# Patient Record
Sex: Female | Born: 1950
Health system: Southern US, Community
[De-identification: ages and names within clinical notes are randomized; demographics above are authoritative.]

## PROBLEM LIST (undated history)

## (undated) DIAGNOSIS — F419 Anxiety disorder, unspecified: Secondary | ICD-10-CM

## (undated) DIAGNOSIS — J302 Other seasonal allergic rhinitis: Secondary | ICD-10-CM

## (undated) DIAGNOSIS — K219 Gastro-esophageal reflux disease without esophagitis: Secondary | ICD-10-CM

## (undated) DIAGNOSIS — Z87440 Personal history of urinary (tract) infections: Secondary | ICD-10-CM

## (undated) DIAGNOSIS — C801 Malignant (primary) neoplasm, unspecified: Secondary | ICD-10-CM

## (undated) DIAGNOSIS — E785 Hyperlipidemia, unspecified: Secondary | ICD-10-CM

## (undated) DIAGNOSIS — I341 Nonrheumatic mitral (valve) prolapse: Secondary | ICD-10-CM

## (undated) DIAGNOSIS — I639 Cerebral infarction, unspecified: Secondary | ICD-10-CM

## (undated) DIAGNOSIS — M1990 Unspecified osteoarthritis, unspecified site: Secondary | ICD-10-CM

## (undated) DIAGNOSIS — I10 Essential (primary) hypertension: Secondary | ICD-10-CM

## (undated) HISTORY — DX: Essential (primary) hypertension: I10

## (undated) HISTORY — DX: Nonrheumatic mitral (valve) prolapse: I34.1

## (undated) HISTORY — PX: BASAL CELL CARCINOMA EXCISION: SHX1214

## (undated) HISTORY — PX: WISDOM TOOTH EXTRACTION: SHX21

## (undated) HISTORY — PX: OTHER SURGICAL HISTORY: SHX169

## (undated) HISTORY — DX: Anxiety disorder, unspecified: F41.9

## (undated) HISTORY — DX: Personal history of urinary (tract) infections: Z87.440

## (undated) HISTORY — DX: Hyperlipidemia, unspecified: E78.5

## (undated) HISTORY — PX: COLONOSCOPY: SHX174

## (undated) HISTORY — DX: Gastro-esophageal reflux disease without esophagitis: K21.9

## (undated) HISTORY — DX: Cerebral infarction, unspecified: I63.9

## (undated) HISTORY — PX: SINUS SURGERY WITH INSTATRAK: SHX5215

---

## 2004-04-13 ENCOUNTER — Ambulatory Visit: Payer: Self-pay | Admitting: Internal Medicine

## 2004-08-01 ENCOUNTER — Ambulatory Visit: Payer: Self-pay | Admitting: Internal Medicine

## 2004-08-08 ENCOUNTER — Ambulatory Visit: Payer: Self-pay | Admitting: Internal Medicine

## 2004-08-17 ENCOUNTER — Ambulatory Visit: Payer: Self-pay | Admitting: Internal Medicine

## 2004-09-15 ENCOUNTER — Ambulatory Visit: Payer: Self-pay | Admitting: Internal Medicine

## 2004-11-28 ENCOUNTER — Ambulatory Visit: Payer: Self-pay | Admitting: Internal Medicine

## 2004-12-29 ENCOUNTER — Ambulatory Visit: Payer: Self-pay | Admitting: Internal Medicine

## 2005-01-03 ENCOUNTER — Encounter: Admission: RE | Admit: 2005-01-03 | Discharge: 2005-01-03 | Payer: Self-pay | Admitting: Internal Medicine

## 2005-06-05 ENCOUNTER — Ambulatory Visit: Payer: Self-pay | Admitting: Internal Medicine

## 2005-06-19 ENCOUNTER — Ambulatory Visit: Payer: Self-pay | Admitting: Internal Medicine

## 2006-04-11 ENCOUNTER — Ambulatory Visit: Payer: Self-pay | Admitting: Internal Medicine

## 2006-04-23 ENCOUNTER — Ambulatory Visit: Payer: Self-pay | Admitting: Internal Medicine

## 2006-05-21 ENCOUNTER — Ambulatory Visit: Payer: Self-pay | Admitting: Internal Medicine

## 2006-06-26 ENCOUNTER — Ambulatory Visit: Payer: Self-pay | Admitting: Internal Medicine

## 2006-07-03 ENCOUNTER — Ambulatory Visit: Payer: Self-pay | Admitting: Internal Medicine

## 2006-08-20 ENCOUNTER — Encounter (INDEPENDENT_AMBULATORY_CARE_PROVIDER_SITE_OTHER): Payer: Self-pay | Admitting: Specialist

## 2006-08-20 ENCOUNTER — Ambulatory Visit (HOSPITAL_BASED_OUTPATIENT_CLINIC_OR_DEPARTMENT_OTHER): Admission: RE | Admit: 2006-08-20 | Discharge: 2006-08-20 | Payer: Self-pay | Admitting: General Surgery

## 2006-12-07 DIAGNOSIS — I10 Essential (primary) hypertension: Secondary | ICD-10-CM | POA: Insufficient documentation

## 2006-12-10 DIAGNOSIS — I341 Nonrheumatic mitral (valve) prolapse: Secondary | ICD-10-CM | POA: Insufficient documentation

## 2006-12-31 ENCOUNTER — Ambulatory Visit: Payer: Self-pay | Admitting: Internal Medicine

## 2006-12-31 DIAGNOSIS — K219 Gastro-esophageal reflux disease without esophagitis: Secondary | ICD-10-CM | POA: Insufficient documentation

## 2006-12-31 DIAGNOSIS — G4700 Insomnia, unspecified: Secondary | ICD-10-CM | POA: Insufficient documentation

## 2007-02-15 ENCOUNTER — Telehealth: Payer: Self-pay | Admitting: Internal Medicine

## 2007-02-16 ENCOUNTER — Ambulatory Visit: Payer: Self-pay | Admitting: Family Medicine

## 2007-02-16 ENCOUNTER — Encounter: Payer: Self-pay | Admitting: Internal Medicine

## 2007-05-10 ENCOUNTER — Ambulatory Visit: Payer: Self-pay | Admitting: Internal Medicine

## 2007-05-13 ENCOUNTER — Telehealth: Payer: Self-pay | Admitting: Internal Medicine

## 2007-06-19 ENCOUNTER — Telehealth: Payer: Self-pay | Admitting: Internal Medicine

## 2007-07-01 ENCOUNTER — Ambulatory Visit: Payer: Self-pay | Admitting: Internal Medicine

## 2007-07-01 DIAGNOSIS — N959 Unspecified menopausal and perimenopausal disorder: Secondary | ICD-10-CM | POA: Insufficient documentation

## 2007-07-03 ENCOUNTER — Ambulatory Visit: Payer: Self-pay | Admitting: Cardiology

## 2007-08-07 ENCOUNTER — Telehealth: Payer: Self-pay | Admitting: Internal Medicine

## 2007-09-17 ENCOUNTER — Ambulatory Visit: Payer: Self-pay | Admitting: Internal Medicine

## 2007-10-17 ENCOUNTER — Telehealth: Payer: Self-pay | Admitting: Internal Medicine

## 2007-10-21 ENCOUNTER — Telehealth: Payer: Self-pay | Admitting: Internal Medicine

## 2007-11-04 ENCOUNTER — Encounter: Payer: Self-pay | Admitting: Internal Medicine

## 2008-01-13 ENCOUNTER — Ambulatory Visit: Payer: Self-pay | Admitting: Internal Medicine

## 2008-01-21 ENCOUNTER — Ambulatory Visit: Payer: Self-pay

## 2008-01-21 ENCOUNTER — Encounter: Payer: Self-pay | Admitting: Internal Medicine

## 2008-01-29 ENCOUNTER — Telehealth: Payer: Self-pay | Admitting: Internal Medicine

## 2008-02-12 ENCOUNTER — Ambulatory Visit: Payer: Self-pay | Admitting: Internal Medicine

## 2008-03-30 ENCOUNTER — Ambulatory Visit: Payer: Self-pay | Admitting: Internal Medicine

## 2008-10-07 ENCOUNTER — Telehealth (INDEPENDENT_AMBULATORY_CARE_PROVIDER_SITE_OTHER): Payer: Self-pay | Admitting: *Deleted

## 2008-11-02 ENCOUNTER — Ambulatory Visit: Payer: Self-pay | Admitting: Internal Medicine

## 2008-11-02 DIAGNOSIS — R42 Dizziness and giddiness: Secondary | ICD-10-CM | POA: Insufficient documentation

## 2009-02-15 ENCOUNTER — Telehealth: Payer: Self-pay | Admitting: Internal Medicine

## 2009-05-15 DIAGNOSIS — I639 Cerebral infarction, unspecified: Secondary | ICD-10-CM

## 2009-05-15 HISTORY — DX: Cerebral infarction, unspecified: I63.9

## 2009-06-02 ENCOUNTER — Inpatient Hospital Stay (HOSPITAL_COMMUNITY): Admission: AD | Admit: 2009-06-02 | Discharge: 2009-06-04 | Payer: Self-pay | Admitting: Neurology

## 2009-06-02 ENCOUNTER — Emergency Department (HOSPITAL_COMMUNITY): Admission: EM | Admit: 2009-06-02 | Discharge: 2009-06-02 | Payer: Self-pay | Admitting: Emergency Medicine

## 2009-06-02 ENCOUNTER — Ambulatory Visit: Payer: Self-pay | Admitting: Cardiovascular Disease

## 2009-06-03 ENCOUNTER — Encounter (INDEPENDENT_AMBULATORY_CARE_PROVIDER_SITE_OTHER): Payer: Self-pay | Admitting: Neurology

## 2009-06-04 ENCOUNTER — Encounter (INDEPENDENT_AMBULATORY_CARE_PROVIDER_SITE_OTHER): Payer: Self-pay | Admitting: Neurology

## 2009-06-04 ENCOUNTER — Encounter: Payer: Self-pay | Admitting: Cardiovascular Disease

## 2010-02-22 ENCOUNTER — Ambulatory Visit: Payer: Self-pay | Admitting: Internal Medicine

## 2010-02-22 DIAGNOSIS — Z8673 Personal history of transient ischemic attack (TIA), and cerebral infarction without residual deficits: Secondary | ICD-10-CM | POA: Insufficient documentation

## 2010-02-22 DIAGNOSIS — R5381 Other malaise: Secondary | ICD-10-CM | POA: Insufficient documentation

## 2010-02-22 DIAGNOSIS — R5383 Other fatigue: Secondary | ICD-10-CM | POA: Insufficient documentation

## 2010-02-22 DIAGNOSIS — E785 Hyperlipidemia, unspecified: Secondary | ICD-10-CM | POA: Insufficient documentation

## 2010-06-16 NOTE — Consult Note (Signed)
Summary: [facility]   Imported By: Roderic Ovens 06/11/2009 14:27:11  _____________________________________________________________________  External Attachment:    Type:   Image     Comment:   External Document

## 2010-06-16 NOTE — Assessment & Plan Note (Signed)
Summary: med check/refill and flu shot/cjr   Vital Signs:  Patient profile:   60 year old female Weight:      135 pounds Temp:     98.6 degrees F oral Pulse rate:   60 / minute Pulse rhythm:   regular BP sitting:   100 / 72  Vitals Entered By: Lynann Beaver CMA (February 22, 2010 12:22 PM)  History of Present Illness:  Follow-Up Visit      This is a 60 year old woman who presents for Follow-up visit.  The patient denies chest pain and palpitations.  Since the last visit the patient notes no new problems or concerns.  The patient reports taking meds as prescribed.  When questioned about possible medication side effects, the patient notes none.    All other systems reviewed and were negative except admits to chronic fatigue---ongoing for months/years  Current Medications (verified): 1)  Hydrochlorothiazide 25 Mg Tabs (Hydrochlorothiazide) .... Take 1/2 By Mouth Daily 2)  Protonix 40 Mg  Tbec (Pantoprazole Sodium) .... Once Daily As Needed 3)  Estrace 1 Mg  Tabs (Estradiol) .... Take 1 Tablet By Mouth Once A Day 4)  Simvastatin 20 Mg Tabs (Simvastatin) .[name].. 1 By Mouth At Bedtime  Allergies (verified): 1)  * Hydrocodeine 2)  Bactrim (Sulfamethoxazole-Trimethoprim) 3)  Citalopram Hydrobromide (Citalopram Hydrobromide) 4)  Codeine Phosphate (Codeine Phosphate) 5)  Macrodantin (Nitrofurantoin Macrocrystal)  Past History:  Past Medical History: Hypertension mitral valve prolapse hx UTI Anxiety GERD Stroke 2011 Hyperlipidemia  Physical Exam  General:  well-developed well-nourished female in no acute distress. HEENT exam atraumatic, normocephalic symmetric her muscles intact. Speech is normal. Chest clear to auscultation without increased work of breathing. Neck is supple without lymphadenopathy, thyromegaly, jugular venous distention. Carotid upstrokes normal. Cardiac exam S1-S2 are regulaR> abdominal exam: Thin, active bowel sounds, soft and nontender.. Extremities there is no  clubbing cyanosis or edema.   Impression & Recommendations:  Problem # 1:  CVA (ICD-434.91) no recurrence  Problem # 2:  HYPERTENSION (ICD-401.9) controlled Her updated medication list for this problem includes:    Hydrochlorothiazide 25 Mg Tabs (Hydrochlorothiazide) .[name]... Take 1/2 by mouth daily  BP today: 100/72 Prior BP: 116/70 (11/02/2008)  Problem # 3:  FATIGUE (ICD-780.79)  Orders: TLB-BMP (Basic Metabolic Panel-BMET) (80048-METABOL) TLB-Hepatic/Liver Function Pnl (80076-HEPATIC) TLB-CBC Platelet - w/Differential (85025-CBCD) TLB-TSH (Thyroid Stimulating Hormone) (84443-TSH) TLB-Sedimentation Rate (ESR) (85652-ESR) Venipuncture (29528) Specimen Handling (41324)  Problem # 4:  HYPERLIPIDEMIA (ICD-272.4)  Her updated medication list for this problem includes:    Simvastatin 20 Mg Tabs (Simvastatin) .[name]... 1 by mouth at bedtime  Orders: TLB-Lipid Panel (80061-LIPID) Venipuncture (40102) Specimen Handling (72536)  Complete Medication List: 1)  Hydrochlorothiazide 25 Mg Tabs (Hydrochlorothiazide) .... Take 1/2 by mouth daily 2)  Protonix 40 Mg Tbec (Pantoprazole sodium) .... Once daily as needed 3)  Estrace 1 Mg Tabs (Estradiol) .... Take 1 tablet by mouth once a day 4)  Simvastatin 20 Mg Tabs (Simvastatin) .[name].. 1 by mouth at bedtime  Other Orders: Admin 1st Vaccine (64403) Flu Vaccine 82yrs + (47425) Prescriptions: SIMVASTATIN 20 MG TABS (SIMVASTATIN) 1 by mouth at bedtime  #90 x 3   Entered and Authorized by:   Birdie Sons MD   Signed by:   Birdie Sons MD on 02/22/2010   Method used:   [facility]        [facility]  [address] [address] [phone][facility] ([facility]ail)       [address]  [place], [place]  [address]       Ph: [phone] or [phone]       Fax: [phone]   RxID:   [id number] PROTONIX 40 MG  TBEC (PANTOPRAZOLE SODIUM) once daily as needed  #90 Each x 3   Entered and Authorized by:   Birdie Sons MD   Signed by:   Birdie Sons MD on  02/22/2010   Method used:   [facility]        [facility]  [address] [phone][facility] (retail[facility]       [address]       [place], [place]  [address]       Ph: [phone] or [phone]       Fax: [phone]   RxID:   [id number] HYDROCHLOROTHIAZIDE 25 MG TABS (HYDROCHLOROTHIAZIDE) Take 1/2 by mouth daily  #100 E90ach x 3   Entered and Authorized by:   Birdie Sons MD   Signed by:   Birdie Sons MD on 02/22/2010   Method used:   [facility]        [facility]  [address] [phone][facility] (retail[facility]       [address]       [place], [place]  [address]       Ph: [phone] or [phone]       Fax: [phone]   RxID:   [id number] SIMVASTATIN 20 MG TABS (SIMVASTATIN) 1 by mouth at bedtime  #90 x 3   Entered and Authorized by:   Birdie Sons MD   Signed by:   Birdie Sons MD on 02/22/2010   Method used:   Electronically to        [facility] (retail)       [address]       [place], [place]  [address]       Ph: [phone]       Fax: [phone]   RxID:   [id number]  Flu Vaccine Consent Questions     Do you have a history of severe allergic reactions to this vaccine? no    Any prior history of allergic reactions to egg and/or gelatin? no    Do you have a sensitivity to the preservative Thimersol? no    Do you have a past history of Guillan-Barre Syndrome? no    Do you currently have an acute febrile illness? no    Have you ever had a severe reaction to latex? no    Vaccine information given and explained to patient? yes    Are you currently pregnant? no    Lot Number:[id number]   Exp Date:11/12/2010   Site Given  Left Deltoid IM       [facility]       [place], [place]  [address]       Ph: [phone]       Fax: [phone]   RxID:   [id number]  .[email or web address]

## 2010-07-31 LAB — LIPID PANEL
Cholesterol: 179 mg/dL (ref 0–200)
VLDL: 14 mg/dL (ref 0–40)

## 2010-07-31 LAB — GLUCOSE, CAPILLARY
Glucose-Capillary: 110 mg/dL — ABNORMAL HIGH (ref 70–99)
Glucose-Capillary: 85 mg/dL (ref 70–99)
Glucose-Capillary: 86 mg/dL (ref 70–99)

## 2010-08-01 LAB — DIFFERENTIAL
Basophils Absolute: 0 10*3/uL (ref 0.0–0.1)
Basophils Relative: 1 % (ref 0–1)
Lymphocytes Relative: 34 % (ref 12–46)
Lymphs Abs: 1.5 10*3/uL (ref 0.7–4.0)
Neutro Abs: 2.5 10*3/uL (ref 1.7–7.7)
Neutrophils Relative %: 55 % (ref 43–77)

## 2010-08-01 LAB — CREATININE, SERUM: Creatinine, Ser: 0.67 mg/dL (ref 0.4–1.2)

## 2010-08-01 LAB — URINALYSIS, ROUTINE W REFLEX MICROSCOPIC
Bilirubin Urine: NEGATIVE
Glucose, UA: NEGATIVE mg/dL
Hgb urine dipstick: NEGATIVE

## 2010-08-01 LAB — TROPONIN I: Troponin I: <0.01 ng/mL (ref 0.00–0.06)

## 2010-08-01 LAB — CBC
Hemoglobin: 15 g/dL (ref 12.0–15.0)
MCHC: 34.1 g/dL (ref 30.0–36.0)
MCV: 90 fL (ref 78.0–100.0)
RBC: 4.89 MIL/uL (ref 3.87–5.11)
RDW: 12.3 % (ref 11.5–15.5)

## 2010-08-01 LAB — PROTIME-INR: INR: 1.08 (ref 0.00–1.49)

## 2010-08-01 LAB — CK TOTAL AND CKMB (NOT AT ARMC)
CK, MB: 0.9 ng/mL (ref 0.3–4.0)
Relative Index: INVALID (ref 0.0–2.5)
Total CK: 59 U/L (ref 7–177)

## 2010-08-01 LAB — RAPID URINE DRUG SCREEN, HOSP PERFORMED
Amphetamines: NOT DETECTED
Benzodiazepines: NOT DETECTED
Opiates: NOT DETECTED

## 2010-08-01 LAB — HEMOGLOBIN A1C: Hgb A1c MFr Bld: 5.4 % (ref 4.6–6.1)

## 2010-09-30 NOTE — Op Note (Signed)
NAMEMITTIE, KNITTEL            ACCOUNT NO.:  [id number]   MEDICAL RECORD NO.:  [id number]          PATIENT TYPE:  AMB   LOCATION:  [facility]SC                          FACILITY:  [facility]   PHYSICIAN:  Timothy E. Earlene Plater, M.D. DATE OF BIRTH:  1951-03-29   DATE OF PROCEDURE:  08/20/2006  DATE OF DISCHARGE:                               OPERATIVE REPORT   PREOPERATIVE DIAGNOSIS:  Lipoma right shoulder.   PROCEDURE:  Excision lipoma right shoulder.   SURGEON:  Kendrick Ranch, M.D.   ANESTHESIA:  General.   Ms. Teaney is 61, otherwise healthy.  She has scheduled an ear, nose  and throat procedure today with Dr. Narda Bonds and she has a large  lipoma right shoulder that she wishes to have removed.  She has been  adequately consulted and she is ready to proceed with surgery.  She was  seen preop, the right shoulder marked, identified and initialed.   The patient is taken back the operating room, placed supine.  General  endotracheal anesthesia was used primarily for Dr. Allene Pyo case.  The  right shoulder was exposed, the large lipoma multiloculated was obvious.  The skin was prepped and draped in the usual fashion.  0.25% Marcaine  with epinephrine was used, a curvilinear incision made in the skin line.  Adequate lipomatous material was seen.  This was in the deep subcu and  was a fairly typical lipoma which was removed.  However, the capsule  joint looked most unusual to me similar to what a sarcoma and has been  seen to appear.  So without further intervention so as not to foul any  further work that would need to be done, I did not biopsy or intrude  upon the shoulder capsule in any way.  The lipoma was removed.  Bleeding  was controlled.  The  wound was closed with wide skin staples.  Counts  correct.  She tolerated it well.  Only one specimen to the lab was  lipoma.  Counts correct.  She tolerated it well and Dr. Ezzard Standing took over  the case.   The patient will be seen and followed as an  outpatient where an MRI will  be scheduled and appropriate referral if necessary.      Timothy E. Earlene Plater, M.D.  Electronically Signed     TED/MEDQ  D:  08/20/2006  T:  08/20/2006  Job:  [id number]

## 2010-09-30 NOTE — Op Note (Signed)
NAMEJOLINDA, Brianna Simon            ACCOUNT NO.:  [id number]   MEDICAL RECORD NO.:  [id number]          PATIENT TYPE:  AMB   LOCATION:  DSC                          FACILITY:  [facility]   PHYSICIAN:  Christopher E. Ezzard Standing, M.D.DATE OF BIRTH:  [birth year]   DATE OF PROCEDURE:  08/20/2006  DATE OF DISCHARGE:                               OPERATIVE REPORT   PREOP DIAGNOSIS:  1. 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  2. Left postauricular cyst.   POSTOP DIAGNOSIS:  1. 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  2. Excision of left postauricular cyst.   OPERATION PERFORMED:  1. Septoplasty.  2. Bilateral inferior turbinate reductions.  3. Excision of left postauricular cyst.   SURGEON:  Christopher E. Ezzard Standing, M.D.   ANESTHESIA:  General endotracheal.   COMPLICATIONS:  None.   BRIEF CLINICAL NOTE:  Brianna Simon is a 60 year old female with  chronic right-sided nasal obstruction.  She had difficulty breathing  through both sides, but the right side is much worse.  On examination  she has a significant septal deviation to the right; and I discussed  with her, extensively, concerning septoplasty and turbinate reductions  to help improve her right nasal airway.  In addition, she has a cyst  behind the left ear.  She has a cyst behind her left ear that she has  had for a number of years; and would like to see if she can have this  removed.  In addition she has a lipoma of the right shoulder which will  be operated on by Dr. Earlene Plater.  She was taken to operating room, at this  time, for septoplasty and turbinate reductions, by myself; along with  excision of left postauricular cyst, and excision of a lipoma by Dr.  Earlene Plater who is going to perform his surgery first.   DESCRIPTION OF PROCEDURE:  After Dr. Earlene Plater completed excision of the  lipoma from her right shoulder; her nose was prepped and draped out with  sterile towels.  The nose was  then further prepped with cotton pledgets  soaked in Afrin and septum.  Turbinates were injected with Xylocaine  with epinephrine.  On examination, she has a fairly significant septal  deviation to the right; the left nasal passageway was actually fairly  clear.   An incision was made along the caudal edge of the septum on the right  side.  Mucoperichondrial and mucoperiosteal flaps were elevated  posteriorly.  At the junction of the cartilaginous and bony septum, the  bony septum likewise was severely deviated to the right; and there was a  large bony crest posteriorly on the right side.  A vertical incision was  made between the cartilaginous and bony septum; and the bony septum,  that was deviated to the right, was removed and this allowed the  cartilaginous septum to return more toward the midline.  There was still  a small crest of cartilaginous septum that protruded off of the right  side of the maxillary crest which was removed; but this allowed much  more improvement of the right nasal passageway after completing  this.  Of note the cartilage of the cartilaginous septum was fairly thin and  flimsy.  This completed the septoplasty portion of procedure.   Next, the inferior turbinates were moderate-sized with not that much  bone.  Some bipolar submucosal cauterization of the inferior turbinates  was performed bilaterally; and the inferior turbinate was then  outfractured.  Next, the hemitransfixion incision was closed with a  single 3-0 chromic suture; and then the 4-0 chromic suture; and then the  septum was basted with a 4-0 chromic suture.  Splints were secured to  either side of septum with a single 3-0 nylon suture.  The nose was then  packed with Telfa soaked in bacitracin ointment bilaterally.   Next, the left ear was prepped with Betadine solution and draped out  with sterile towels, gloves were changed; and new instruments were  utilized.  She had an approximate 2 cm cyst  behind the left ear that was  consistent with a sebaceous cyst.  An incision was made through the  skin.  Some of the overlying skin of the cyst was excised along with the  cyst which was kept intact and sent to pathology.  The wound was then  closed in a single layer of 6-0 nylon suture.  Bacitracin ointment and  sterile dressing was applied.  This completed the procedure.  Brianna Simon was  awoken from anesthesia and transferred to the recovery room and postop  doing well.  Of note, she received 1 gram of Ancef IV preoperatively.   DISPOSITION:  Brianna Simon will be discharged home later this morning on  Keflex 500 mg b.i.d. for 1 week, Tylenol Darvocet N 100 p.r.n. pain, and  Percocet one to two q.4 h. p.r.n. pain for more severe pain.           ______________________________  Kristine Garbe. Ezzard Standing, M.D.     CEN/MEDQ  D:  08/20/2006  T:  08/20/2006  Job:  [id number]   cc:   Valetta Mole. Swords, MD  Sheppard Plumber. Earlene Plater, M.D.

## 2010-11-07 ENCOUNTER — Other Ambulatory Visit: Payer: Self-pay | Admitting: *Deleted

## 2010-11-07 DIAGNOSIS — E785 Hyperlipidemia, unspecified: Secondary | ICD-10-CM

## 2010-11-07 MED ORDER — SERTRALINE HCL 100 MG PO TABS: 100.0000 mg | ORAL_TABLET | Freq: Every day | ORAL | Status: DC

## 2011-02-02 ENCOUNTER — Ambulatory Visit: Payer: Self-pay | Admitting: Internal Medicine

## 2011-02-22 ENCOUNTER — Ambulatory Visit: Payer: Self-pay | Admitting: Internal Medicine

## 2011-03-16 ENCOUNTER — Encounter: Payer: Self-pay | Admitting: Internal Medicine

## 2011-03-17 ENCOUNTER — Encounter: Payer: Self-pay | Admitting: Internal Medicine

## 2011-03-17 ENCOUNTER — Ambulatory Visit (INDEPENDENT_AMBULATORY_CARE_PROVIDER_SITE_OTHER): Payer: No Typology Code available for payment source | Admitting: Internal Medicine

## 2011-03-17 VITALS — BP 132/84 | HR 64 | Temp 98.3°F | Ht 61.5 in | Wt 141.0 lb

## 2011-03-17 DIAGNOSIS — Z23 Encounter for immunization: Secondary | ICD-10-CM

## 2011-03-17 DIAGNOSIS — M67919 Unspecified disorder of synovium and tendon, unspecified shoulder: Secondary | ICD-10-CM

## 2011-03-17 DIAGNOSIS — I10 Essential (primary) hypertension: Secondary | ICD-10-CM

## 2011-03-17 DIAGNOSIS — M719 Bursopathy, unspecified: Secondary | ICD-10-CM

## 2011-03-17 DIAGNOSIS — I6350 Cerebral infarction due to unspecified occlusion or stenosis of unspecified cerebral artery: Secondary | ICD-10-CM

## 2011-03-17 DIAGNOSIS — E785 Hyperlipidemia, unspecified: Secondary | ICD-10-CM

## 2011-03-17 DIAGNOSIS — M75101 Unspecified rotator cuff tear or rupture of right shoulder, not specified as traumatic: Secondary | ICD-10-CM

## 2011-03-17 NOTE — Assessment & Plan Note (Signed)
Fair control Continue meds 

## 2011-03-17 NOTE — Assessment & Plan Note (Signed)
No recurrent sxs Risk factor modification

## 2011-03-17 NOTE — Progress Notes (Signed)
  Subjective:    Patient ID: Brianna Simon, female    DOB: 1951/03/12, 60 y.o.   MRN: [id number]  HPI  Here for f/u  strok--no recurrence---reviewed imaging studies htn--tolerating meds Lipids---needs f/u, tolerating meds  Past Medical History  Diagnosis Date  . Hypertension   . Mitral valve prolapse   . Hx: UTI (urinary tract infection)   . Anxiety   . GERD (gastroesophageal reflux disease)   . Stroke 2011  . Hyperlipidemia    Past Surgical History  Procedure Date  . Colonoscopy   . Sinus surgery with instatrak   . Lipoma removed     reports that she quit smoking about 8 years ago. She does not have any smokeless tobacco history on file. She reports that she does not use illicit drugs. Her alcohol history not on file. family history includes Diverticulitis in her other and Heart disease in her mother. Allergies  Allergen Reactions  . Citalopram Hydrobromide     REACTION: blurred vision  . Codeine Phosphate     REACTION: nausea,vomiting  . Nitrofurantoin     REACTION: itching  . Sulfamethoxazole W/Trimethoprim     REACTION: itching     Review of Systems  patient denies chest pain, shortness of breath, orthopnea. Denies lower extremity edema, abdominal pain, change in appetite, change in bowel movements. Patient denies rashes, musculoskeletal complaints. No other specific complaints in a complete review of systems.      Objective:   Physical Exam  Well-developed well-nourished female in no acute distress. HEENT exam atraumatic, normocephalic, extraocular muscles are intact. Neck is supple. No jugular venous distention no thyromegaly. Chest clear to auscultation without increased work of breathing. Cardiac exam S1 and S2 are regular. Abdominal exam active bowel sounds, soft, nontender. Extremities no edema.       Assessment & Plan:

## 2011-03-17 NOTE — Assessment & Plan Note (Signed)
Tolerating meds Check labs today 

## 2011-03-17 NOTE — Progress Notes (Signed)
Addended by: Rita Ohara R on: 03/17/2011 09:47 AM   Modules accepted: Orders

## 2011-04-04 ENCOUNTER — Ambulatory Visit: Payer: No Typology Code available for payment source | Admitting: Physical Therapy

## 2011-05-17 ENCOUNTER — Other Ambulatory Visit: Payer: Self-pay | Admitting: *Deleted

## 2011-05-17 MED ORDER — SIMVASTATIN 20 MG PO TABS: 20.0000 mg | ORAL_TABLET | Freq: Every day | ORAL | Status: DC

## 2011-05-30 ENCOUNTER — Telehealth: Payer: Self-pay | Admitting: Internal Medicine

## 2011-05-30 NOTE — Telephone Encounter (Signed)
Pt need work note stating her medical conditions. 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 Please fax to attn darlene williams [phone].

## 2011-05-31 ENCOUNTER — Encounter: Payer: Self-pay | Admitting: *Deleted

## 2011-05-31 NOTE — Telephone Encounter (Signed)
Letter typed and faxed

## 2011-05-31 NOTE — Telephone Encounter (Signed)
To whom it may concern:  [name] suffered a stroke in 2011. 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   Sincerely, Birdie Sons

## 2011-07-19 ENCOUNTER — Telehealth: Payer: Self-pay | Admitting: *Deleted

## 2011-07-19 NOTE — Telephone Encounter (Addendum)
Doy Hutching, please call this gentleman to help him with arranging labs and xrays ordered by her MD that treated her CVA..  Dr. Cato Mulligan pt, and she has some complicated things and places that she would like to order these.  Thanks.  I don't know if Dr. Cato Mulligan needs to see the pt and whether the insurance pays, etc.   Discussed with Arline Asp, and she remember doing this last year.

## 2011-07-19 NOTE — Telephone Encounter (Signed)
Returned call to pt requesting to have labs that were ordered by Dr. Pearlean Brownie at [facility] (ANA, TSH, sed rate & vitamin D) dx 782.0, done at [facility] office, since [facility] has advised they can not send the test to [facility]. Please advise if ok to have labs done here.

## 2011-07-20 NOTE — Telephone Encounter (Signed)
ok 

## 2011-07-21 ENCOUNTER — Other Ambulatory Visit (INDEPENDENT_AMBULATORY_CARE_PROVIDER_SITE_OTHER): Payer: No Typology Code available for payment source

## 2011-07-21 DIAGNOSIS — D519 Vitamin B12 deficiency anemia, unspecified: Secondary | ICD-10-CM

## 2011-07-21 DIAGNOSIS — D518 Other vitamin B12 deficiency anemias: Secondary | ICD-10-CM

## 2011-10-03 ENCOUNTER — Other Ambulatory Visit: Payer: Self-pay | Admitting: *Deleted

## 2011-10-03 MED ORDER — PANTOPRAZOLE SODIUM 40 MG PO TBEC: 40.0000 mg | DELAYED_RELEASE_TABLET | Freq: Every day | ORAL | Status: DC

## 2012-01-07 ENCOUNTER — Other Ambulatory Visit: Payer: Self-pay | Admitting: Internal Medicine

## 2012-01-08 ENCOUNTER — Other Ambulatory Visit: Payer: Self-pay | Admitting: *Deleted

## 2012-01-08 MED ORDER — ESTRADIOL 1 MG PO TABS: 1.0000 mg | ORAL_TABLET | Freq: Every day | ORAL | Status: DC

## 2012-02-09 ENCOUNTER — Ambulatory Visit (INDEPENDENT_AMBULATORY_CARE_PROVIDER_SITE_OTHER): Payer: No Typology Code available for payment source

## 2012-02-09 DIAGNOSIS — Z23 Encounter for immunization: Secondary | ICD-10-CM

## 2012-04-07 ENCOUNTER — Other Ambulatory Visit: Payer: Self-pay | Admitting: Internal Medicine

## 2012-04-15 ENCOUNTER — Telehealth: Payer: Self-pay | Admitting: Internal Medicine

## 2012-04-15 ENCOUNTER — Other Ambulatory Visit: Payer: Self-pay | Admitting: Internal Medicine

## 2012-04-15 DIAGNOSIS — E785 Hyperlipidemia, unspecified: Secondary | ICD-10-CM

## 2012-04-15 MED ORDER — SERTRALINE HCL 100 MG PO TABS: 100.0000 mg | ORAL_TABLET | Freq: Every day | ORAL | Status: DC

## 2012-04-15 MED ORDER — SIMVASTATIN 20 MG PO TABS: 20.0000 mg | ORAL_TABLET | Freq: Every day | ORAL | Status: DC

## 2012-04-15 NOTE — Telephone Encounter (Signed)
rx sent in electronically, pt has appt in January

## 2012-04-15 NOTE — Telephone Encounter (Signed)
Pt called and has sch ov for 05/21/12 and is going to need refill of sertraline (ZOLOFT) [facility] [place] [place], to last until appt date.

## 2012-04-15 NOTE — Telephone Encounter (Signed)
rx sent in electronically 

## 2012-04-15 NOTE — Telephone Encounter (Signed)
Pt has appt sch for 05-22-2011. Pt needs refill on sertraline 100mg  [facility] [place]  [phone]

## 2012-05-13 ENCOUNTER — Telehealth: Payer: Self-pay | Admitting: Internal Medicine

## 2012-05-13 NOTE — Telephone Encounter (Signed)
Patient Information:  Caller Name: Duffy Rhody  Phone: [phone]  Patient: Brianna Simon, Brianna Simon  Gender: Female  DOB: 05/06/1951  Age: 61 Years  PCP: Birdie Sons (Adults only)  Office Follow Up:  Does the office need to follow up with this patient?: Yes  Instructions For The Office: Patient wants to know from doctor if she needs f/u appointment. She also wants to know if she can try Immodium OTC for  Diarrhea  RN Note:  Patient had fever, diarrhea, headache, congestion, body aches on 05/07/12, when she did not get better she went to Urgent Care on Sat. 05/11/12. She was given an injection of Rocephin and Decadron. They put her on Amoxiccillin pills. She is calling today to see if she needs to come in to f/u with Dr. Cato Mulligan. She states she feels 50% better today. Her diarrhea stopped after Sat visit to Urgent care but has returned today. She has had about 6 stools today. She is wondering if this might be from antibiotic. She also continues to have a cough. Home care advice given for both issues. Please call to let patient know if Dr. Cato Mulligan thinks she needs a f/u visit. Please let patient know as well if she can take Immodium OTC.  Symptoms  Reason For Call & Symptoms: cough, congested, diarrhea x 6 this morning  Reviewed Health History In EMR: Yes  Reviewed Medications In EMR: Yes  Reviewed Allergies In EMR: Yes  Reviewed Surgeries / Procedures: Yes  Date of Onset of Symptoms: 05/07/2012  Treatments Tried: Mucinex, Robitussin, Rocephin, Amoxicillin, Decadron  Treatments Tried Worked: Yes  Guideline(s) Used:  Diarrhea  Cough  Disposition Per Guideline:   Home Care  Reason For Disposition Reached:   Cough with no complications  Advice Given:  Fluids:  Drink more fluids, at least 8-10 glasses (8 oz or 240 ml) daily.  Fluids:  Drink more fluids, at least 8-10 glasses (8 oz or 240 ml) daily.  Avoid caffeinated beverages (Reason: caffeine is mildly dehydrating).  Nutrition:  Ideal initial foods include boiled starches/cereals (e.g., potatoes, rice, noodles, wheat, oats) with a small amount of salt to taste.  Call Back If:  Signs of dehydration occur (e.g., no urine for more than 12 hours, very dry mouth, lightheaded, etc.)  Diarrhea lasts over 7 days  You become worse.  Coughing Spasms:  Drink warm fluids. Inhale warm mist (Reason: both relax the airway and loosen up the phlegm).  Suck on cough drops or hard candy to coat the irritated throat.  Prevent Dehydration:  Drink adequate liquids.  Avoid Tobacco Smoke:  Smoking or being exposed to smoke makes coughs much worse.  Call Back If:  Difficulty breathing  Cough lasts more than 3 weeks  Cough Medicines:  Home Remedy - Honey: This old home remedy has been shown to help decrease coughing at night. The adult dosage is 2 teaspoons (10 ml) at bedtime. Honey should not be given to infants under one year of age.

## 2012-05-13 NOTE — Telephone Encounter (Signed)
Pt is taking augmentin and will take imodium for diarrhea--feeling 60-70 percent better- told to continue tx, drink plenty of liquid and call back if not better

## 2012-05-16 ENCOUNTER — Ambulatory Visit (INDEPENDENT_AMBULATORY_CARE_PROVIDER_SITE_OTHER): Payer: No Typology Code available for payment source | Admitting: Family Medicine

## 2012-05-16 ENCOUNTER — Encounter: Payer: Self-pay | Admitting: Family Medicine

## 2012-05-16 VITALS — BP 120/70 | HR 83 | Temp 97.9°F | Wt 139.0 lb

## 2012-05-16 DIAGNOSIS — J111 Influenza due to unidentified influenza virus with other respiratory manifestations: Secondary | ICD-10-CM

## 2012-05-16 NOTE — Progress Notes (Signed)
Chief Complaint  Patient presents with  . post urgent care follow up    HPI:  Follow up diarrhea: -per pt report symptoms started 8 days ago -symptoms included: fever, diarrhea, headache, congestion, body aches -seen in urgent care after 4 days and tx with Rocephin, decadron and amoxicillin -current symptoms: doing much better, stools are more formed - not watery now -denies: abd pain, fevers, nausea, vomiting  ROS: See pertinent positives and negatives per HPI.  Past Medical History  Diagnosis Date  . Hypertension   . Mitral valve prolapse   . Hx: UTI (urinary tract infection)   . Anxiety   . GERD (gastroesophageal reflux disease)   . Stroke 2011  . Hyperlipidemia     Family History  Problem Relation Age of Onset  . Heart disease Mother     pacemaker   . Diverticulitis Other     History   Social History  . Marital Status: Married    Spouse Name: N/A    Number of Children: N/A  . Years of Education: N/A   Social History Main Topics  . Smoking status: Former Smoker    Quit date: 03/17/2003  . Smokeless tobacco: None  . Alcohol Use: None  . Drug Use: No  . Sexually Active: None   Other Topics Concern  . None   Social History Narrative  . None    Current outpatient prescriptions:estradiol (ESTRACE) 1 MG tablet, Take 1 tablet (1 mg total) by mouth daily., Disp: 90 tablet, Rfl: 0;  hydrochlorothiazide (HYDRODIURIL) 25 MG tablet, TAKE 1/2 BY MOUTH DAILY, Disp: 100 tablet, Rfl: 2;  pantoprazole (PROTONIX) 40 MG tablet, Take 1 tablet (40 mg total) by mouth daily., Disp: 90 tablet, Rfl: 1;  sertraline (ZOLOFT) 100 MG tablet, Take 1 tablet (100 mg total) by mouth daily., Disp: 90 tablet, Rfl: 0 simvastatin (ZOCOR) 20 MG tablet, Take 1 tablet (20 mg total) by mouth at bedtime., Disp: 90 tablet, Rfl: 0  EXAM:  Filed Vitals:   05/16/12 0817  BP: 120/70  Pulse: 83  Temp: 97.9 F (36.6 C)    There is no height on file to calculate BMI.  GENERAL: vitals  reviewed and listed above, alert, oriented, appears well hydrated and in no acute distress  HEENT: atraumatic, conjunttiva clear, no obvious abnormalities on inspection of external nose and ears  NECK: no obvious masses on inspection  LUNGS: clear to auscultation bilaterally, no wheezes, rales or rhonchi, good air movement  CV: HRRR, no peripheral edema  ABD: BS+, soft, NTTP  MS: moves all extremities without noticeable abnormality  PSYCH: pleasant and cooperative, no obvious depression or anxiety  ASSESSMENT AND PLAN:  Discussed the following assessment and plan:  1. Influenza-like illness    -versus gastroenteritis - resolving -25 minutes spent with this patient and spouse answering questions and in discussion/patient care -Patient advised to return or notify a doctor immediately if symptoms worsen or persist or new concerns arise.  Patient Instructions  -no dairy for the next week  -imodium if needed  -stop antibiotic in two days  -follow up with your doctor as scheduled     KIM, Dahlia Client R.

## 2012-05-16 NOTE — Patient Instructions (Signed)
-  no dairy for the next week  -imodium if needed  -stop antibiotic in two days  -follow up with your doctor as scheduled

## 2012-05-21 ENCOUNTER — Ambulatory Visit: Payer: No Typology Code available for payment source | Admitting: Internal Medicine

## 2012-05-31 ENCOUNTER — Encounter: Payer: Self-pay | Admitting: Internal Medicine

## 2012-05-31 ENCOUNTER — Ambulatory Visit (INDEPENDENT_AMBULATORY_CARE_PROVIDER_SITE_OTHER): Payer: No Typology Code available for payment source | Admitting: Internal Medicine

## 2012-05-31 VITALS — BP 122/72 | HR 64 | Temp 98.3°F | Ht 61.0 in | Wt 139.0 lb

## 2012-05-31 DIAGNOSIS — I10 Essential (primary) hypertension: Secondary | ICD-10-CM

## 2012-05-31 DIAGNOSIS — K219 Gastro-esophageal reflux disease without esophagitis: Secondary | ICD-10-CM

## 2012-05-31 DIAGNOSIS — E785 Hyperlipidemia, unspecified: Secondary | ICD-10-CM

## 2012-05-31 DIAGNOSIS — Z0000 Encounter for general adult medical examination without abnormal findings: Secondary | ICD-10-CM

## 2012-05-31 DIAGNOSIS — I6350 Cerebral infarction due to unspecified occlusion or stenosis of unspecified cerebral artery: Secondary | ICD-10-CM

## 2012-05-31 LAB — BASIC METABOLIC PANEL
Chloride: 107 mEq/L (ref 96–112)
GFR: 112.89 mL/min (ref >60.00)
Potassium: 3.6 mEq/L (ref 3.5–5.1)

## 2012-05-31 LAB — CBC WITH DIFFERENTIAL/PLATELET
Basophils Absolute: 0 10*3/uL (ref 0.0–0.1)
Eosinophils Absolute: 0.2 10*3/uL (ref 0.0–0.7)
Hemoglobin: 13.8 g/dL (ref 12.0–15.0)
Lymphocytes Relative: 30.4 % (ref 12.0–46.0)
MCHC: 33.4 g/dL (ref 30.0–36.0)
Monocytes Relative: 9.9 % (ref 3.0–12.0)
Neutro Abs: 2.3 10*3/uL (ref 1.4–7.7)
Neutrophils Relative %: 55.3 % (ref 43.0–77.0)
RBC: 4.5 Mil/uL (ref 3.87–5.11)
RDW: 13.3 % (ref 11.5–14.6)

## 2012-05-31 LAB — LIPID PANEL
LDL Cholesterol: 63 mg/dL (ref 0–99)
Total CHOL/HDL Ratio: 3
Triglycerides: 80 mg/dL (ref 0.0–149.0)
VLDL: 16 mg/dL (ref 0.0–40.0)

## 2012-05-31 LAB — HEPATIC FUNCTION PANEL
ALT: 30 U/L (ref 0–35)
AST: 22 U/L (ref 0–37)
Bilirubin, Direct: 0.1 mg/dL (ref 0.0–0.3)
Total Bilirubin: 0.6 mg/dL (ref 0.3–1.2)

## 2012-05-31 NOTE — Assessment & Plan Note (Signed)
No recurrence- check labs today

## 2012-05-31 NOTE — Assessment & Plan Note (Signed)
No sxs on meds 

## 2012-05-31 NOTE — Progress Notes (Signed)
Patient ID: Brianna Simon, female   DOB: 05/09/1951, 62 y.o.   MRN: [id number] Stroke- no recurrence  Lipids- tolerating meds  Mood - doing well on meds  GERD-- tolerating PPI  Past Medical History  Diagnosis Date  . Hypertension   . Mitral valve prolapse   . Hx: UTI (urinary tract infection)   . Anxiety   . GERD (gastroesophageal reflux disease)   . Stroke 2011  . Hyperlipidemia     History   Social History  . Marital Status: Married    Spouse Name: N/A    Number of Children: N/A  . Years of Education: N/A   Occupational History  . Not on file.   Social History Main Topics  . Smoking status: Former Smoker    Quit date: 03/17/2003  . Smokeless tobacco: Not on file  . Alcohol Use: Not on file  . Drug Use: No  . Sexually Active: Not on file   Other Topics Concern  . Not on file   Social History Narrative  . No narrative on file    Past Surgical History  Procedure Date  . Colonoscopy   . Sinus surgery with instatrak   . Lipoma removed     Family History  Problem Relation Age of Onset  . Heart disease Mother     pacemaker   . Diverticulitis Other     Allergies  Allergen Reactions  . Citalopram Hydrobromide     REACTION: blurred vision  . Codeine Phosphate     REACTION: nausea,vomiting  . Nitrofurantoin     REACTION: itching  . Nsaids   . Sulfamethoxazole W-Trimethoprim     REACTION: itching    Current Outpatient Prescriptions on File Prior to Visit  Medication Sig Dispense Refill  . estradiol (ESTRACE) 1 MG tablet Take 1 tablet (1 mg total) by mouth daily.  90 tablet  0  . hydrochlorothiazide (HYDRODIURIL) 25 MG tablet TAKE 1/2 BY MOUTH DAILY  100 tablet  2  . pantoprazole (PROTONIX) 40 MG tablet Take 1 tablet (40 mg total) by mouth daily.  90 tablet  1  . sertraline (ZOLOFT) 100 MG tablet Take 1 tablet (100 mg total) by mouth daily.  90 tablet  0  . simvastatin (ZOCOR) 20 MG tablet Take 1 tablet (20 mg total) by mouth at bedtime.  90  tablet  0     patient denies chest pain, shortness of breath, orthopnea. Denies lower extremity edema, abdominal pain, change in appetite, change in bowel movements. Patient denies rashes, musculoskeletal complaints. No other specific complaints in a complete review of systems.   BP 122/72  Pulse 64  Temp 98.3 F (36.8 C) (Oral)  Ht 5\' 1"  (1.549 m)  Wt 139 lb (63.05 kg)  BMI 26.26 kg/m2  Well-developed well-nourished female in no acute distress. HEENT exam atraumatic, normocephalic, extraocular muscles are intact. Neck is supple. No jugular venous distention no thyromegaly. Chest clear to auscultation without increased work of breathing. Cardiac exam S1 and S2 are regular. Abdominal exam active bowel sounds, soft, nontender. Extremities no edema. Neurologic exam she is alert without any motor sensory deficits. Gait is normal.

## 2012-05-31 NOTE — Assessment & Plan Note (Signed)
Tolerating meds- continue same

## 2012-05-31 NOTE — Assessment & Plan Note (Signed)
Tolerating hctz-- continue same

## 2012-06-05 ENCOUNTER — Encounter: Payer: Self-pay | Admitting: Internal Medicine

## 2012-06-24 ENCOUNTER — Other Ambulatory Visit: Payer: Self-pay | Admitting: *Deleted

## 2012-06-24 MED ORDER — PANTOPRAZOLE SODIUM 40 MG PO TBEC: 40.0000 mg | DELAYED_RELEASE_TABLET | Freq: Every day | ORAL | Status: DC

## 2012-07-01 ENCOUNTER — Ambulatory Visit
Admission: RE | Admit: 2012-07-01 | Discharge: 2012-07-01 | Disposition: A | Discharge status: Home / Self Care | Payer: No Typology Code available for payment source | Source: Ambulatory Visit | Attending: Internal Medicine | Admitting: Internal Medicine

## 2012-07-01 DIAGNOSIS — Z1231 Encounter for screening mammogram for malignant neoplasm of breast: Secondary | ICD-10-CM

## 2012-07-01 DIAGNOSIS — Z0000 Encounter for general adult medical examination without abnormal findings: Secondary | ICD-10-CM

## 2012-07-09 ENCOUNTER — Encounter: Payer: Self-pay | Admitting: Internal Medicine

## 2012-07-10 ENCOUNTER — Encounter: Payer: Self-pay | Admitting: Internal Medicine

## 2012-08-01 ENCOUNTER — Other Ambulatory Visit: Payer: Self-pay | Admitting: Internal Medicine

## 2012-08-10 ENCOUNTER — Other Ambulatory Visit: Payer: Self-pay | Admitting: Internal Medicine

## 2012-08-13 ENCOUNTER — Telehealth: Payer: Self-pay | Admitting: *Deleted

## 2012-08-13 NOTE — Telephone Encounter (Signed)
I spoke to patient and advised her to see her primary physician if the nose bleeding or pain recurs again. I did not feel this was a neurological issue.

## 2012-08-13 NOTE — Telephone Encounter (Signed)
Patient called in and stated she was having a pain that ran up her face around her eye into her eye socket and eyeball last thursaday. Patient stated last Friday she woke feeling as if she could not breath and blew her nose bright red blood came. Patient states nothing else happen since then but is very concerned about it please advise

## 2012-08-14 NOTE — Telephone Encounter (Signed)
appt scheduled

## 2012-08-14 NOTE — Telephone Encounter (Signed)
Ok to see anyone here

## 2012-08-14 NOTE — Telephone Encounter (Signed)
Please schedule appt

## 2012-08-19 ENCOUNTER — Ambulatory Visit (INDEPENDENT_AMBULATORY_CARE_PROVIDER_SITE_OTHER): Payer: No Typology Code available for payment source | Admitting: Family

## 2012-08-19 ENCOUNTER — Encounter: Payer: Self-pay | Admitting: Family

## 2012-08-19 VITALS — BP 120/70 | HR 63 | Wt 141.0 lb

## 2012-08-19 DIAGNOSIS — R040 Epistaxis: Secondary | ICD-10-CM

## 2012-08-19 NOTE — Progress Notes (Signed)
Subjective:    Patient ID: Brianna Simon, female    DOB: 1950/06/16, 62 y.o.   MRN: [id number]  HPI 62 year old white female, nonsmoker, patient of Dr. Cato Mulligan is in today with complaints of nosebleeds that occurred one week ago. Patient reports feeling sinus pressure approximately 3 weeks ago with congestion. One week ago, she blew her nose and blew out a large amount of blood in and mucus. Since that point, she has felt better. Denies any sinus pressure or pain, no bleeding, no sneezing, cough or congestion.   Review of Systems  Constitutional: Negative.   HENT: Positive for nosebleeds and congestion. Negative for sore throat, sneezing and postnasal drip.   Eyes: Negative.   Respiratory: Negative.   Cardiovascular: Negative.   Musculoskeletal: Negative.   Skin: Negative.   Allergic/Immunologic: Negative.  Negative for environmental allergies and food allergies.  Hematological: Negative.   Psychiatric/Behavioral: Negative.    Past Medical History  Diagnosis Date  . Hypertension   . Mitral valve prolapse   . Hx: UTI (urinary tract infection)   . Anxiety   . GERD (gastroesophageal reflux disease)   . Stroke 2011  . Hyperlipidemia     History   Social History  . Marital Status: Married    Spouse Name: N/A    Number of Children: N/A  . Years of Education: N/A   Occupational History  . Not on file.   Social History Main Topics  . Smoking status: Former Smoker    Quit date: 03/17/2003  . Smokeless tobacco: Not on file  . Alcohol Use: Not on file  . Drug Use: No  . Sexually Active: Not on file   Other Topics Concern  . Not on file   Social History Narrative  . No narrative on file    Past Surgical History  Procedure Laterality Date  . Colonoscopy    . Sinus surgery with instatrak    . Lipoma removed      Family History  Problem Relation Age of Onset  . Heart disease Mother     pacemaker   . Diverticulitis Other     Allergies  Allergen Reactions   . Citalopram Hydrobromide     REACTION: blurred vision  . Codeine Phosphate     REACTION: nausea,vomiting  . Nitrofurantoin     REACTION: itching  . Nsaids   . Sulfamethoxazole W-Trimethoprim     REACTION: itching    Current Outpatient Prescriptions on File Prior to Visit  Medication Sig Dispense Refill  . aspirin 325 MG tablet Take 162.5 mg by mouth daily.      [name] estradiol (ESTRACE) 1 MG tablet TAKE 1 TABLET (1 MG TOTAL) BY MOUTH DAILY.  90 tablet  0  . hydrochlorothiazide (HYDRODIURIL) 25 MG tablet TAKE 1/2 BY MOUTH DAILY  100 tablet  2  . pantoprazole (PROTONIX) 40 MG tablet Take 1 tablet (40 mg total) by mouth daily.  90 tablet  3  . sertraline (ZOLOFT) 100 MG tablet TAKE 1 TABLET BY MOUTH DAILY  90 tablet  0  . simvastatin (ZOCOR) 20 MG tablet Take 1 tablet (20 mg total) by mouth at bedtime.  90 tablet  0   No current facility-administered medications on file prior to visit.    BP 120/70  Pulse 63  Wt 141 lb (63.957 kg)  BMI 26.66 kg/m2  SpO2 98%chart    Objective:   Physical Exam  Constitutional: She is oriented to person, place, and time. She appears well-developed and  well-nourished.  HENT:  Right Ear: External ear normal.  Left Ear: External ear normal.  Nose: Nose normal.  Mouth/Throat: Oropharynx is clear and moist.  Neck: Normal range of motion. Neck supple.  Cardiovascular: Normal rate, regular rhythm and normal heart sounds.   Pulmonary/Chest: Effort normal and breath sounds normal.  Neurological: She is alert and oriented to person, place, and time.  Skin: Skin is warm and dry.  Psychiatric: She has a normal mood and affect.          Assessment & Plan:  Assessment:  1. Epistaxis  Plan: Patient symptoms have completely resolved. Advised nasal saline as needed for congestion and nosebleeds. Recheck as scheduled and as needed sooner.Marland Kitchenand and and and

## 2012-09-07 ENCOUNTER — Other Ambulatory Visit: Payer: Self-pay | Admitting: Internal Medicine

## 2012-11-13 ENCOUNTER — Ambulatory Visit: Payer: Self-pay | Admitting: Neurology

## 2012-12-17 ENCOUNTER — Other Ambulatory Visit: Payer: Self-pay | Admitting: Internal Medicine

## 2013-01-18 ENCOUNTER — Other Ambulatory Visit: Payer: Self-pay | Admitting: Internal Medicine

## 2013-02-10 ENCOUNTER — Ambulatory Visit (INDEPENDENT_AMBULATORY_CARE_PROVIDER_SITE_OTHER): Payer: 59 | Admitting: Neurology

## 2013-02-10 ENCOUNTER — Ambulatory Visit (INDEPENDENT_AMBULATORY_CARE_PROVIDER_SITE_OTHER): Payer: No Typology Code available for payment source | Admitting: Internal Medicine

## 2013-02-10 ENCOUNTER — Encounter: Payer: Self-pay | Admitting: Neurology

## 2013-02-10 DIAGNOSIS — Z23 Encounter for immunization: Secondary | ICD-10-CM

## 2013-02-10 DIAGNOSIS — I6789 Other cerebrovascular disease: Secondary | ICD-10-CM

## 2013-02-10 NOTE — Progress Notes (Signed)
[facility] [address] [place]. [place] [address] [phone]       OFFICE FOLLOW-UP NOTE  [name] Date of Birth:  November 10, 1950 Medical Record Number:  [id number]   HPI: 63 year Caucasian lady with remote small left pareital embolic infarct in January 2011 without definite identified source of embolism.Vascular risk factors of Hypertension and Hyperlipidimia. Mild chronic lower extremities paresthesias  Update 02/10/2013  She returns for f/u after last visit on 01/16/2012.She remains stable without any new stroke or TIA signs and symptoms.She is tolerating aspirin well without side effects and BP is well controlled and last lipid profile in February this year was fine. She has mild paresthesias in her feet which appear unchanged from last visit.She has minor nasal bleeds related to her sinuses after sneezing but no unprovoked bleeding.Shehas intermittent occasional blurred vision and dizziness without vertigo.  ROS:   14 system review of systems is positive for ringing in ears,spinning sensation, double vision,allergies, running nose,depression  PMH:  Past Medical History  Diagnosis Date  . Hypertension   . Mitral valve prolapse   . Hx: UTI (urinary tract infection)   . Anxiety   . GERD (gastroesophageal reflux disease)   . Stroke 2011  . Hyperlipidemia     Social History:  History   Social History  . Marital Status: Married    Spouse Name: N/A    Number of Children: N/A  . Years of Education: N/A   Occupational History  . Not on file.   Social History Main Topics  . Smoking status: Former Smoker    Quit date: 03/17/2003  . Smokeless tobacco: Not on file  . Alcohol Use: Yes     Comment: occasionally  . Drug Use: No  . Sexual Activity: Not on file   Other Topics Concern  . Not on file   Social History Narrative   Caffeine Use: daily    Medications:   Current Outpatient Prescriptions on File Prior to Visit  Medication Sig  Dispense Refill  . aspirin 325 MG tablet Take 162.5 mg by mouth daily.      [name] estradiol (ESTRACE) 1 MG tablet TAKE 1 TABLET BY MOUTH EVERY DAY  90 tablet  0  . hydrochlorothiazide (HYDRODIURIL) 25 MG tablet TAKE 1/2 BY MOUTH DAILY  100 tablet  2  . pantoprazole (PROTONIX) 40 MG tablet Take 1 tablet (40 mg total) by mouth daily.  90 tablet  3  . sertraline (ZOLOFT) 100 MG tablet TAKE 1 TABLET BY MOUTH DAILY  90 tablet  0  . simvastatin (ZOCOR) 20 MG tablet TAKE 1 TABLET BY MOUTH AT BEDTIME  90 tablet  1   No current facility-administered medications on file prior to visit.    Allergies:   Allergies  Allergen Reactions  . Citalopram Hydrobromide     REACTION: blurred vision  . Codeine Phosphate     REACTION: nausea,vomiting  . Nitrofurantoin     REACTION: itching  . Nsaids   . Sulfamethoxazole W-Trimethoprim     REACTION: itching  There were no vitals filed for this visit.   Physical Exam General: well developed, well nourished, seated, in no evident distress Head: head normocephalic and atraumatic. Orohparynx benign Neck: supple with no carotid or supraclavicular bruits Cardiovascular: regular rate and rhythm, no murmurs Musculoskeletal: no deformity Skin:  no rash/petichiae Vascular:  Normal pulses all extremities There were no vitals filed for this visit.  Neurologic Exam Mental Status: Awake and fully alert. Oriented to  place and time. Recent and remote memory intact. Attention span, concentration and fund of knowledge appropriate. Mood and affect appropriate.  Cranial Nerves: Fundoscopic exam not done. Pupils equal, briskly reactive to light. Extraocular movements full without nystagmus. Visual fields full to confrontation. Hearing intact. Facial sensation intact. Face, tongue, palate moves normally and symmetrically.  Motor: Normal bulk and tone. Normal strength in all tested extremity muscles. Sensory.: intact to touch and pinprick and vibratory sensation.  Coordination:  Rapid alternating movements normal in all extremities. Finger-to-nose and heel-to-shin performed accurately bilaterally. Gait and Station: Arises from chair without difficulty. Stance is normal. Gait demonstrates normal stride length and balance . Able to heel, toe and tandem walk without difficulty.  Reflexes: 1+ and symmetric. Toes downgoing.      ASSESSMENT: 82 year Caucasian lady with remote small left pareital embolic infarct in January 2011 without definite identified source of embolism.Vascular risk factors of Hypertension and Hyperlipidimia. Mild chronic lower extremities paresthesias which appear stable.    PLAN: Continue aspirin 162 mg daily and strict control of hypertension with blood pressure goal below 130/90 and lipids with LDL cholesterol goal below 100 mg percent. Check followup CMP and carotid ultrasound. Return for followup in one year with Larita Fife, NP or call earlier if necessary

## 2013-02-10 NOTE — Patient Instructions (Addendum)
Continue aspirin 162 mg daily and strict control of hypertension with blood pressure goal below 130/90 and lipids with LDL cholesterol goal below 100 mg percent. Check followup CMP and carotid ultrasound. Return for followup in one year with Larita Fife, NP or call earlier if necessary

## 2013-03-11 ENCOUNTER — Ambulatory Visit (INDEPENDENT_AMBULATORY_CARE_PROVIDER_SITE_OTHER): Payer: 59

## 2013-03-11 DIAGNOSIS — I6789 Other cerebrovascular disease: Secondary | ICD-10-CM

## 2013-03-11 DIAGNOSIS — I6350 Cerebral infarction due to unspecified occlusion or stenosis of unspecified cerebral artery: Secondary | ICD-10-CM

## 2013-03-24 ENCOUNTER — Encounter: Payer: Self-pay | Admitting: Podiatry

## 2013-03-24 ENCOUNTER — Ambulatory Visit (INDEPENDENT_AMBULATORY_CARE_PROVIDER_SITE_OTHER): Payer: 59 | Admitting: Podiatry

## 2013-03-24 ENCOUNTER — Ambulatory Visit (INDEPENDENT_AMBULATORY_CARE_PROVIDER_SITE_OTHER): Payer: 59

## 2013-03-24 VITALS — BP 123/70 | HR 57 | Resp 18

## 2013-03-24 DIAGNOSIS — M722 Plantar fascial fibromatosis: Secondary | ICD-10-CM

## 2013-03-24 DIAGNOSIS — M79609 Pain in unspecified limb: Secondary | ICD-10-CM

## 2013-03-24 DIAGNOSIS — M79672 Pain in left foot: Secondary | ICD-10-CM

## 2013-03-24 MED ORDER — TRIAMCINOLONE ACETONIDE 10 MG/ML IJ SUSP
5.0000 mg | Freq: Once | INTRAMUSCULAR | Status: AC
  Administered 2013-03-24: 5 mg via INTRA_ARTICULAR

## 2013-03-24 NOTE — Progress Notes (Signed)
°  Subjective:    Patient ID: Tonny Bollman, female    DOB: May 15, 1951, 62 y.o.   MRN: [id number]  HPIleft heel hurts on the bottom and has been going on for about 2 months and is sore and tender    Review of Systems  Constitutional: Negative.   HENT: Positive for sinus pressure.        Ringing in ears  Eyes: Negative.   Respiratory: Negative.   Cardiovascular: Negative.   Gastrointestinal: Negative.   Endocrine: Negative.   Genitourinary: Negative.   Musculoskeletal:       Joint pain  Skin: Negative.   Allergic/Immunologic: Positive for environmental allergies.  Neurological: Positive for numbness.  Hematological: Bruises/bleeds easily.  Psychiatric/Behavioral: Negative.        Objective:   Physical Exam        Assessment & Plan:

## 2013-03-24 NOTE — Progress Notes (Signed)
Subjective:     Patient ID: Brianna Simon, female   DOB: 09-Jul-1950, 62 y.o.   MRN: [id number]  HPI patient presents stating I have had heel pain for the last 2 months which has been very very sore. I have had a history of this and have worn orthotics which have worn out over the last year   Review of Systems  All other systems reviewed and are negative.       Objective:   Physical Exam  Nursing note and vitals reviewed. Constitutional: She is oriented to person, place, and time.  Cardiovascular: Intact distal pulses.   Musculoskeletal: Normal range of motion.  Neurological: She is oriented to person, place, and time.   patient is found to have tenderness in the plantar heel left at the insertion into the medial calcaneal tubercle     Assessment:     Plantar fasciitis of the left heel    Plan:     H&P and x-ray reviewed. Injected the left plantar fascia 3 mg Kenalog 5 mg Xylocaine Marcaine mixture and dispensed fascial brace. Reappoint in 1 week

## 2013-04-03 ENCOUNTER — Other Ambulatory Visit (INDEPENDENT_AMBULATORY_CARE_PROVIDER_SITE_OTHER): Payer: No Typology Code available for payment source

## 2013-04-03 ENCOUNTER — Encounter: Payer: Self-pay | Admitting: Podiatry

## 2013-04-03 ENCOUNTER — Ambulatory Visit (INDEPENDENT_AMBULATORY_CARE_PROVIDER_SITE_OTHER): Payer: No Typology Code available for payment source | Admitting: *Deleted

## 2013-04-03 ENCOUNTER — Ambulatory Visit (INDEPENDENT_AMBULATORY_CARE_PROVIDER_SITE_OTHER): Payer: 59 | Admitting: Podiatry

## 2013-04-03 VITALS — BP 124/65 | HR 62 | Resp 16

## 2013-04-03 DIAGNOSIS — I10 Essential (primary) hypertension: Secondary | ICD-10-CM

## 2013-04-03 DIAGNOSIS — Z23 Encounter for immunization: Secondary | ICD-10-CM

## 2013-04-03 DIAGNOSIS — Z2911 Encounter for prophylactic immunotherapy for respiratory syncytial virus (RSV): Secondary | ICD-10-CM

## 2013-04-03 DIAGNOSIS — E785 Hyperlipidemia, unspecified: Secondary | ICD-10-CM

## 2013-04-03 DIAGNOSIS — M722 Plantar fascial fibromatosis: Secondary | ICD-10-CM

## 2013-04-03 LAB — COMPREHENSIVE METABOLIC PANEL
ALT: 17 U/L (ref 0–35)
AST: 17 U/L (ref 0–37)
Albumin: 4.1 g/dL (ref 3.5–5.2)
Alkaline Phosphatase: 100 U/L (ref 39–117)
Calcium: 9 mg/dL (ref 8.4–10.5)
Chloride: 106 mEq/L (ref 96–112)
Creat: 0.56 mg/dL (ref 0.50–1.10)
Glucose, Bld: 84 mg/dL (ref 70–99)
Total Bilirubin: 0.5 mg/dL (ref 0.3–1.2)

## 2013-04-03 MED ORDER — TRIAMCINOLONE ACETONIDE 10 MG/ML IJ SUSP
5.0000 mg | Freq: Once | INTRAMUSCULAR | Status: AC
  Administered 2013-04-03: 5 mg via INTRA_ARTICULAR

## 2013-04-03 NOTE — Addendum Note (Signed)
Addended by: Serena Colonel on: 04/03/2013 08:45 AM   Modules accepted: Orders

## 2013-04-03 NOTE — Progress Notes (Signed)
Subjective:     Patient ID: Brianna Simon, female   DOB: 1951/04/14, 62 y.o.   MRN: [id number]  HPI patient states it is doing better still hurting the somewhat underneath my left heel. Has orthotics and would like new orthotics to replace   Review of Systems     Objective:   Physical Exam  Nursing note and vitals reviewed. Constitutional: She is oriented to person, place, and time.  Cardiovascular: Intact distal pulses.   Neurological: She is oriented to person, place, and time.  Skin: Skin is warm.   pain in the left plantar heel still noted at the medial calcaneus fascia insertion     Assessment:     Plantar fasciitis left still noted    Plan:     Today discussed orthotics in scan for custom orthotic devices and went ahead and reinjected the plantar fascia 3 mg Kenalog 5 of Xylocaine Marcaine mixture and advised him reduced activity for several days

## 2013-06-04 ENCOUNTER — Other Ambulatory Visit: Payer: Self-pay | Admitting: Internal Medicine

## 2013-06-12 ENCOUNTER — Ambulatory Visit: Payer: 59 | Admitting: Podiatry

## 2013-06-12 ENCOUNTER — Other Ambulatory Visit: Payer: Self-pay

## 2013-06-12 DIAGNOSIS — Z1231 Encounter for screening mammogram for malignant neoplasm of breast: Secondary | ICD-10-CM

## 2013-06-13 ENCOUNTER — Telehealth: Payer: Self-pay | Admitting: Internal Medicine

## 2013-06-13 LAB — HM PAP SMEAR: HM PAP: NORMAL

## 2013-06-13 NOTE — Telephone Encounter (Signed)
Pt states that she has a new obgyn, [facility]. Pt is currently taking esterol (estrogen), however obgyn states that pt should be on ?progestoeron, pt states obgyn also wants to do ultrasound of uterus and pt wants to make sure this is ok. Please advise.

## 2013-06-16 NOTE — Telephone Encounter (Signed)
Pt aware.

## 2013-06-16 NOTE — Telephone Encounter (Signed)
Follow obgyn instructions

## 2013-06-20 ENCOUNTER — Encounter: Payer: No Typology Code available for payment source | Admitting: Family Medicine

## 2013-06-20 ENCOUNTER — Encounter: Payer: Self-pay | Admitting: Family Medicine

## 2013-06-20 ENCOUNTER — Ambulatory Visit (INDEPENDENT_AMBULATORY_CARE_PROVIDER_SITE_OTHER): Payer: No Typology Code available for payment source | Admitting: Family Medicine

## 2013-06-20 VITALS — BP 124/68 | HR 66 | Temp 97.8°F | Wt 143.0 lb

## 2013-06-20 DIAGNOSIS — M545 Low back pain, unspecified: Secondary | ICD-10-CM

## 2013-06-20 MED ORDER — HYDROCODONE-ACETAMINOPHEN 5-325 MG PO TABS: ORAL_TABLET | ORAL | Status: DC

## 2013-06-20 NOTE — Progress Notes (Signed)
   Subjective:    Patient ID: Brianna Simon, female    DOB: 1951-01-14, 63 y.o.   MRN: [id number]  Back Pain Pertinent negatives include no abdominal pain, chest pain, dysuria or fever.   Patient seen with back pain. Onset about 4 days ago. She tripped trying to not step on her dog but did not actually fall. Location is right lower lumbar. Pain is occasionally sharp somewhat intermittent and worse with standing and changing positions. Less with sitting. She denies any lower extremity radiculopathy symptoms or any numbness or weakness. No urine or stool incontinence. No fevers or chills. She's tried heat, ice, Tylenol, and Aspercreme without relief Severity is 8/10 at its worst. Denies any history of chronic back difficulties. Denies any recent appetite or weight changes.  Past Medical History  Diagnosis Date  . Hypertension   . Mitral valve prolapse   . Hx: UTI (urinary tract infection)   . Anxiety   . GERD (gastroesophageal reflux disease)   . Stroke 2011  . Hyperlipidemia    Past Surgical History  Procedure Laterality Date  . Colonoscopy    . Sinus surgery with instatrak    . Lipoma removed      reports that she quit smoking about 10 years ago. She has never used smokeless tobacco. She reports that she drinks alcohol. She reports that she does not use illicit drugs. family history includes Diverticulitis in her other; Heart disease in her mother. Allergies  Allergen Reactions  . Citalopram Hydrobromide     REACTION: blurred vision  . Codeine Phosphate     REACTION: nausea,vomiting  . Nitrofurantoin     REACTION: itching  . Nsaids   . Sulfamethoxazole-Trimethoprim     REACTION: itching      Review of Systems  Constitutional: Negative for fever, chills, appetite change and unexpected weight change.  Respiratory: Negative for cough.   Cardiovascular: Negative for chest pain.  Gastrointestinal: Negative for abdominal pain.  Genitourinary: Negative for  dysuria.  Musculoskeletal: Positive for back pain.  Skin: Negative for rash.  Hematological: Negative for adenopathy.       Objective:   Physical Exam  Nursing note and vitals reviewed. Constitutional: She is oriented to person, place, and time. She appears well-developed and well-nourished.  Cardiovascular: Normal rate.   Pulmonary/Chest: Effort normal and breath sounds normal. No respiratory distress. She has no wheezes. She has no rales.  Musculoskeletal: She exhibits no edema.  She has some nonspecific tenderness right lower lumbar region. No spinal tenderness. Straight leg raises are negative.  Neurological: She is alert and oriented to person, place, and time.  Full-strength lower extremities. Gait is normal. Deep tendon reflexes are 2+ ankle and knee bilaterally. Normal sensory function throughout.          Assessment & Plan:  Right lumbar back pain. Suspect musculoskeletal strain. We recommended heat for symptomatic relief. Would avoid tramadol with her chronic use of sertraline. She's been intolerant of nonsteroidals in the past. Limited Vicodin 5/325 mg one to 2 every 6 hours as a for severe pain #30 with no refill. Consider physical therapy in 2 weeks if no better. Reviewed simple stretches.

## 2013-06-20 NOTE — Progress Notes (Signed)
Error   This encounter was created in error - please disregard. 

## 2013-06-20 NOTE — Progress Notes (Signed)
Pre visit review using our clinic review tool, if applicable. No additional management support is needed unless otherwise documented below in the visit note. 

## 2013-06-20 NOTE — Patient Instructions (Signed)
Lumbosacral Strain Lumbosacral strain is a strain of any of the parts that make up your lumbosacral vertebrae. Your lumbosacral vertebrae are the bones that make up the lower third of your backbone. Your lumbosacral vertebrae are held together by muscles and tough, fibrous tissue (ligaments).  CAUSES  A sudden blow to your back can cause lumbosacral strain. Also, anything that causes an excessive stretch of the muscles in the low back can cause this strain. This is typically seen when people exert themselves strenuously, fall, lift heavy objects, bend, or crouch repeatedly. RISK FACTORS  Physically demanding work.  Participation in pushing or pulling sports or sports that require sudden twist of the back (tennis, golf, baseball).  Weight lifting.  Excessive lower back curvature.  Forward-tilted pelvis.  Weak back or abdominal muscles or both.  Tight hamstrings. SIGNS AND SYMPTOMS  Lumbosacral strain may cause pain in the area of your injury or pain that moves (radiates) down your leg.  DIAGNOSIS Your health care provider can often diagnose lumbosacral strain through a physical exam. In some cases, you may need tests such as X-ray exams.  TREATMENT  Treatment for your lower back injury depends on many factors that your clinician will have to evaluate. However, most treatment will include the use of anti-inflammatory medicines. HOME CARE INSTRUCTIONS   Avoid hard physical activities (tennis, racquetball, waterskiing) if you are not in proper physical condition for it. This may aggravate or create problems.  If you have a back problem, avoid sports requiring sudden body movements. Swimming and walking are generally safer activities.  Maintain good posture.  Maintain a healthy weight.  For acute conditions, you may put ice on the injured area.  Put ice in a plastic bag.  Place a towel between your skin and the bag.  Leave the ice on for 20 minutes, 2 3 times a day.  When the  low back starts healing, stretching and strengthening exercises may be recommended. SEEK MEDICAL CARE IF:  Your back pain is getting worse.  You experience severe back pain not relieved with medicines. SEEK IMMEDIATE MEDICAL CARE IF:   You have numbness, tingling, weakness, or problems with the use of your arms or legs.  There is a change in bowel or bladder control.  You have increasing pain in any area of the body, including your belly (abdomen).  You notice shortness of breath, dizziness, or feel faint.  You feel sick to your stomach (nauseous), are throwing up (vomiting), or become sweaty.  You notice discoloration of your toes or legs, or your feet get very cold. MAKE SURE YOU:   Understand these instructions.  Will watch your condition.  Will get help right away if you are not doing well or get worse. Document Released: 02/08/2005 Document Revised: 02/19/2013 Document Reviewed: 12/18/2012 [employer] Patient Information 2014 [employer].  

## 2013-06-30 ENCOUNTER — Encounter: Payer: Self-pay | Admitting: Podiatry

## 2013-06-30 ENCOUNTER — Ambulatory Visit (INDEPENDENT_AMBULATORY_CARE_PROVIDER_SITE_OTHER): Payer: 59 | Admitting: Podiatry

## 2013-06-30 ENCOUNTER — Ambulatory Visit: Payer: 59 | Admitting: Podiatry

## 2013-06-30 VITALS — BP 122/68 | HR 72 | Resp 12

## 2013-06-30 DIAGNOSIS — M722 Plantar fascial fibromatosis: Secondary | ICD-10-CM

## 2013-06-30 MED ORDER — TRIAMCINOLONE ACETONIDE 10 MG/ML IJ SUSP
10.0000 mg | Freq: Once | INTRAMUSCULAR | Status: AC
  Administered 2013-06-30: 10 mg

## 2013-07-01 NOTE — Progress Notes (Signed)
Subjective:     Patient ID: Brianna Simon, female   DOB: 1950-11-21, 63 y.o.   MRN: [id number]  HPI patient presents stating the left heel is still hurting and making exercise difficult   Review of Systems     Objective:   Physical Exam Neurovascular status intact no health history changes with pain to palpation left plantar fascia    Assessment:     Plantar fasciitis left heel    Plan:     Reinjected the plantar fascia 3 mg Kenalog 5 mg Xylocaine Marcaine mixture and advised on orthotics with instructions on usage and also physical therapy

## 2013-07-14 ENCOUNTER — Ambulatory Visit: Payer: No Typology Code available for payment source

## 2013-07-16 ENCOUNTER — Telehealth: Payer: Self-pay | Admitting: *Deleted

## 2013-07-16 NOTE — Telephone Encounter (Signed)
Mailed copy of carotid doppler study results for pts record.

## 2013-07-28 ENCOUNTER — Ambulatory Visit (INDEPENDENT_AMBULATORY_CARE_PROVIDER_SITE_OTHER): Payer: 59 | Admitting: Podiatry

## 2013-07-28 ENCOUNTER — Ambulatory Visit
Admission: RE | Admit: 2013-07-28 | Discharge: 2013-07-28 | Disposition: A | Discharge status: Home / Self Care | Payer: No Typology Code available for payment source | Source: Ambulatory Visit

## 2013-07-28 ENCOUNTER — Encounter: Payer: Self-pay | Admitting: Podiatry

## 2013-07-28 VITALS — BP 120/68 | HR 64 | Resp 15 | Ht 61.5 in | Wt 135.0 lb

## 2013-07-28 DIAGNOSIS — M722 Plantar fascial fibromatosis: Secondary | ICD-10-CM

## 2013-07-28 DIAGNOSIS — Z1231 Encounter for screening mammogram for malignant neoplasm of breast: Secondary | ICD-10-CM

## 2013-07-28 NOTE — Progress Notes (Signed)
   Subjective:    Patient ID: Brianna Simon, female    DOB: 02/07/1951, 63 y.o.   MRN: [id number] Pt states her heel hurts mainly after work. HPI    Review of Systems     Objective:   Physical Exam        Assessment & Plan:

## 2013-07-28 NOTE — Patient Instructions (Signed)

## 2013-07-30 NOTE — Progress Notes (Signed)
Subjective:     Patient ID: Brianna Simon, female   DOB: May 07, 1951, 63 y.o.   MRN: [id number]  HPI patient's left plantar heel is improving at this time with mild discomfort when she does a lot of walking   Review of Systems     Objective:   Physical Exam Neurovascular status intact with no health history changes noted in pain in the plantar heel left at the insertional point of the tendon into the calcaneus    Assessment:     Plantar fasciitis left with inflammation and fluid around the medial band    Plan:     instructed on physical therapy and advised on supportive shoe gear and orthotics depending on response

## 2013-08-30 ENCOUNTER — Other Ambulatory Visit: Payer: Self-pay | Admitting: Internal Medicine

## 2013-09-03 ENCOUNTER — Telehealth: Payer: Self-pay | Admitting: Internal Medicine

## 2013-09-03 NOTE — Telephone Encounter (Signed)
Pt req r

## 2013-09-03 NOTE — Telephone Encounter (Signed)
Pt req rx on hydrochlorothiazide (HYDRODIURIL) 25 MG tablet ° ° °

## 2013-09-03 NOTE — Telephone Encounter (Signed)
Pt needs an appt

## 2013-09-05 MED ORDER — HYDROCHLOROTHIAZIDE 25 MG PO TABS: ORAL_TABLET | ORAL | Status: DC

## 2013-09-05 NOTE — Telephone Encounter (Signed)
rx sent in electronically 

## 2013-09-05 NOTE — Telephone Encounter (Signed)
Pt has appt sch with dr Maudie Mercury on 09-22-13 per pt request

## 2013-09-20 ENCOUNTER — Other Ambulatory Visit: Payer: Self-pay | Admitting: Internal Medicine

## 2013-09-22 ENCOUNTER — Ambulatory Visit (INDEPENDENT_AMBULATORY_CARE_PROVIDER_SITE_OTHER): Payer: No Typology Code available for payment source | Admitting: Family Medicine

## 2013-09-22 ENCOUNTER — Encounter: Payer: Self-pay | Admitting: Family Medicine

## 2013-09-22 VITALS — BP 120/80 | HR 58 | Temp 98.1°F | Ht 61.5 in | Wt 141.0 lb

## 2013-09-22 DIAGNOSIS — F329 Major depressive disorder, single episode, unspecified: Secondary | ICD-10-CM

## 2013-09-22 DIAGNOSIS — F32A Depression, unspecified: Secondary | ICD-10-CM

## 2013-09-22 DIAGNOSIS — I10 Essential (primary) hypertension: Secondary | ICD-10-CM

## 2013-09-22 DIAGNOSIS — I059 Rheumatic mitral valve disease, unspecified: Secondary | ICD-10-CM

## 2013-09-22 DIAGNOSIS — E785 Hyperlipidemia, unspecified: Secondary | ICD-10-CM

## 2013-09-22 DIAGNOSIS — I6350 Cerebral infarction due to unspecified occlusion or stenosis of unspecified cerebral artery: Secondary | ICD-10-CM

## 2013-09-22 DIAGNOSIS — K219 Gastro-esophageal reflux disease without esophagitis: Secondary | ICD-10-CM

## 2013-09-22 DIAGNOSIS — F3289 Other specified depressive episodes: Secondary | ICD-10-CM

## 2013-09-22 MED ORDER — SIMVASTATIN 20 MG PO TABS: ORAL_TABLET | ORAL | Status: DC

## 2013-09-22 MED ORDER — SERTRALINE HCL 100 MG PO TABS
ORAL_TABLET | ORAL | Status: DC
Start: 2013-09-22 — End: 2014-10-27

## 2013-09-22 MED ORDER — PANTOPRAZOLE SODIUM 40 MG PO TBEC: 40.0000 mg | DELAYED_RELEASE_TABLET | Freq: Every day | ORAL | Status: DC

## 2013-09-22 MED ORDER — HYDROCHLOROTHIAZIDE 25 MG PO TABS: ORAL_TABLET | ORAL | Status: DC

## 2013-09-22 NOTE — Progress Notes (Signed)
Pre visit review using our clinic review tool, if applicable. No additional management support is needed unless otherwise documented below in the visit note. 

## 2013-09-22 NOTE — Progress Notes (Signed)
No chief complaint on file.   HPI:  Med Check:  PCP, Dr. Leanne Chang, not available  1-3) HTN/CVA/HLD: -reports on HCTZ, ASA, and statin for a long time and stable -sees neurologist   4) GERD: -stable on protonix - if stops symptoms return  5)Depression/Anxiety: -on zoloft -stable, reports doing well -no counseling    ROS: See pertinent positives and negatives per HPI.  Past Medical History  Diagnosis Date  . Hypertension   . Mitral valve prolapse   . Hx: UTI (urinary tract infection)   . Anxiety   . GERD (gastroesophageal reflux disease)   . Stroke 2011  . Hyperlipidemia     Past Surgical History  Procedure Laterality Date  . Colonoscopy    . Sinus surgery with instatrak    . Lipoma removed      Family History  Problem Relation Age of Onset  . Heart disease Mother     pacemaker   . Diverticulitis Other     History   Social History  . Marital Status: Married    Spouse Name: N/A    Number of Children: N/A  . Years of Education: N/A   Social History Main Topics  . Smoking status: Former Smoker    Quit date: 03/17/2003  . Smokeless tobacco: Never Used  . Alcohol Use: Yes     Comment: occasionally  . Drug Use: No  . Sexual Activity: None   Other Topics Concern  . None   Social History Narrative   Caffeine Use: daily    Current outpatient prescriptions:aspirin 325 MG tablet, Take 162.5 mg by mouth daily., Disp: , Rfl: ;  hydrochlorothiazide (HYDRODIURIL) 25 MG tablet, TAKE 1/2 BY MOUTH DAILY, Disp: 45 tablet, Rfl: 3;  pantoprazole (PROTONIX) 40 MG tablet, Take 1 tablet (40 mg total) by mouth daily., Disp: 90 tablet, Rfl: 3;  sertraline (ZOLOFT) 100 MG tablet, TAKE 1 TABLET BY MOUTH DAILY, Disp: 90 tablet, Rfl: 3 simvastatin (ZOCOR) 20 MG tablet, TAKE 1 TABLET BY MOUTH AT BEDTIME, Disp: 90 tablet, Rfl: 3  EXAM:  Filed Vitals:   09/22/13 1300  BP: 120/80  Pulse: 58  Temp: 98.1 F (36.7 C)    Body mass index is 26.21 kg/(m^2).  GENERAL:  vitals reviewed and listed above, alert, oriented, appears well hydrated and in no acute distress  HEENT: atraumatic, conjunttiva clear, no obvious abnormalities on inspection of external nose and ears  NECK: no obvious masses on inspection  LUNGS: clear to auscultation bilaterally, no wheezes, rales or rhonchi, good air movement  CV: HRRR, no peripheral edema  MS: moves all extremities without noticeable abnormality  PSYCH: pleasant and cooperative, no obvious depression or anxiety  ASSESSMENT AND PLAN:  Discussed the following assessment and plan:  HYPERTENSION - Plan: hydrochlorothiazide (HYDRODIURIL) 25 MG tablet  HYPERLIPIDEMIA - Plan: simvastatin (ZOCOR) 20 MG tablet  CVA  Esophageal reflux - Plan: pantoprazole (PROTONIX) 40 MG tablet  MITRAL VALVE PROLAPSE  Depression - Plan: sertraline (ZOLOFT) 100 MG tablet  -stable -meds refilled -she is set up to see Dr. Yong Channel to establish care per her report -Patient advised to return or notify a doctor immediately if symptoms worsen or persist or new concerns arise.  There are no Patient Instructions on file for this visit.   Lucretia Kern

## 2013-09-23 ENCOUNTER — Telehealth: Payer: Self-pay | Admitting: Family Medicine

## 2013-09-23 NOTE — Telephone Encounter (Signed)
Relevant patient education assigned to patient using Emmi. ° °

## 2013-10-16 ENCOUNTER — Encounter (HOSPITAL_COMMUNITY): Payer: Self-pay | Admitting: Pharmacist

## 2013-10-23 ENCOUNTER — Encounter (HOSPITAL_COMMUNITY): Payer: Self-pay

## 2013-10-23 ENCOUNTER — Encounter (HOSPITAL_COMMUNITY)
Admission: RE | Admit: 2013-10-23 | Discharge: 2013-10-23 | Disposition: A | Discharge status: Home / Self Care | Payer: No Typology Code available for payment source | Source: Ambulatory Visit | Attending: Obstetrics and Gynecology | Admitting: Obstetrics and Gynecology

## 2013-10-23 HISTORY — DX: Unspecified osteoarthritis, unspecified site: M19.90

## 2013-10-23 HISTORY — DX: Other seasonal allergic rhinitis: J30.2

## 2013-10-23 HISTORY — DX: Malignant (primary) neoplasm, unspecified: C80.1

## 2013-10-23 LAB — CBC
HEMATOCRIT: 44.3 % (ref 36.0–46.0)
Hemoglobin: 14.8 g/dL (ref 12.0–15.0)
MCH: 30.7 pg (ref 26.0–34.0)
MCHC: 33.4 g/dL (ref 30.0–36.0)
MCV: 91.9 fL (ref 78.0–100.0)
Platelets: 250 10*3/uL (ref 150–400)
RBC: 4.82 MIL/uL (ref 3.87–5.11)
RDW: 13.4 % (ref 11.5–15.5)
WBC: 4.9 10*3/uL (ref 4.0–10.5)

## 2013-10-23 LAB — BASIC METABOLIC PANEL
BUN: 11 mg/dL (ref 6–23)
CHLORIDE: 104 meq/L (ref 96–112)
CO2: 28 meq/L (ref 19–32)
Calcium: 9.3 mg/dL (ref 8.4–10.5)
Creatinine, Ser: 0.66 mg/dL (ref 0.50–1.10)
GFR calc Af Amer: >90 mL/min (ref >90)
GFR calc non Af Amer: >90 mL/min (ref >90)
Glucose, Bld: 88 mg/dL (ref 70–99)
POTASSIUM: 3.7 meq/L (ref 3.7–5.3)
SODIUM: 140 meq/L (ref 137–147)

## 2013-10-23 LAB — ABO/RH: ABO/RH(D): O POS

## 2013-10-23 NOTE — Pre-Procedure Instructions (Signed)
PCP provider is [facility], Dr Colin Benton [phone].

## 2013-10-23 NOTE — H&P (Signed)
Brianna Simon is an 63 y.o. female who is presenting for a scheduled hysteroscopy/endometrial sampling, possible polypectomy.  The patient first came to our office this year for an annual checkup and reported she had been using oral estrogen unopposed for 3 years for hot flashes.  SHe denied any VB, but this was a bit concerning so a vaginal [place] to evaluate her endometrium was performed with the lining measuring 46mm and a SIUS showing two sessile appearing polyps in the upper anterior fundus.  We have discussed that these are likely benign, but given the unopposed estrogen, the safest thing to do is to remove them and sample the endometrium.  Pertinent Gynecological History: OB History:NSVD x 1 baby given up for adoption   Menstrual History:  No LMP recorded. Patient is postmenopausal.    Past Medical History  Diagnosis Date  . Hypertension   . Mitral valve prolapse     does not cause patient any problems  . Hx: UTI (urinary tract infection)   . Anxiety   . GERD (gastroesophageal reflux disease)   . Hyperlipidemia   . Stroke 2011    mild  . Seasonal allergies   . Arthritis     hips, hands, lower back - otc med prn  . Cancer     skin cancer left wrist and left chest  . SVD (spontaneous vaginal delivery)     x 1 - gave up for adoption    Past Surgical History  Procedure Laterality Date  . Colonoscopy    . Sinus surgery with instatrak    . Lipoma removed      right shoulder  . Wisdom tooth extraction      Family History  Problem Relation Age of Onset  . Heart disease Mother     pacemaker   . Diverticulitis Other     Social History:  reports that she quit smoking about 10 years ago. Her smoking use included Cigarettes. She has a 60 pack-year smoking history. She has never used smokeless tobacco. She reports that she drinks alcohol. She reports that she does not use illicit drugs.  Allergies:  Allergies  Allergen Reactions  . Citalopram Hydrobromide     REACTION:  blurred vision  . Codeine Phosphate     Liquid formulation only bothers her; tolerates percocet fine.  REACTION: nausea,vomiting  . Nitrofurantoin     REACTION: itching  . Nsaids Other (See Comments)    Elevated liver panel  . Sulfamethoxazole-Trimethoprim     REACTION: itching    No prescriptions prior to admission    Review of Systems  Gastrointestinal: Negative for abdominal pain.  Genitourinary: Negative for dysuria.    There were no vitals taken for this visit. Physical Exam  Constitutional: She is oriented to person, place, and time. She appears well-developed and well-nourished.  Cardiovascular: Normal rate and regular rhythm.   Respiratory: Effort normal.  GI: Soft.  Genitourinary: Vagina normal and uterus normal.  Neurological: She is alert and oriented to person, place, and time.  Psychiatric: She has a normal mood and affect.    Results for orders placed during the hospital encounter of 10/23/13 (from the past 24 hour(s))  ABO/RH     Status: None   Collection Time    10/23/13 11:55 AM      Result Value Ref Range   ABO/RH(D) O POS    BASIC METABOLIC PANEL     Status: None   Collection Time    10/23/13 11:55 AM  Result Value Ref Range   Sodium 140  137 - 147 mEq/L   Potassium 3.7  3.7 - 5.3 mEq/L   Chloride 104  96 - 112 mEq/L   CO2 28  19 - 32 mEq/L   Glucose, Bld 88  70 - 99 mg/dL   BUN 11  6 - 23 mg/dL   Creatinine, Ser 0.66  0.50 - 1.10 mg/dL   Calcium 9.3  8.4 - 10.5 mg/dL   GFR calc non Af Amer >90  >90 mL/min   GFR calc Af Amer >90  >90 mL/min  CBC     Status: None   Collection Time    10/23/13 11:55 AM      Result Value Ref Range   WBC 4.9  4.0 - 10.5 K/uL   RBC 4.82  3.87 - 5.11 MIL/uL   Hemoglobin 14.8  12.0 - 15.0 g/dL   HCT 44.3  36.0 - 46.0 %   MCV 91.9  78.0 - 100.0 fL   MCH 30.7  26.0 - 34.0 pg   MCHC 33.4  30.0 - 36.0 g/dL   RDW 13.4  11.5 - 15.5 %   Platelets 250  150 - 400 K/uL    No results  found.  Assessment/Plan: The patient was counseled regarding the hysteroscopy procedure in detail. We reviewed risks of bleeding and infection and possible uterine perforation. We also discussed the removal of any identified polyps or submucosal fibroids and what that would involve. The patient desires to proceed. She will use cytotec 416mcg 3 hours prior to the procedure to aid with cervical dilation.   Logan Bores 10/23/2013, 8:08 PM

## 2013-10-23 NOTE — Patient Instructions (Addendum)
   Your procedure is scheduled on:  Friday, June 12   Enter through the [employer] [facility] at: 6 AM Pick up the phone at the desk and dial [phone] and inform us of your arrival.  Please call this number if you have any problems the morning of surgery: [phone]  Remember: Do not eat or drink after midnight: Thursday: Take these medicines the morning of surgery with a SIP OF WATER:  Protonix, zoloft, hctz, zyrtec if needed  Do not wear jewelry, make-up, or FINGER nail polish No metal in your hair or on your body. Do not wear lotions, powders, perfumes.  You may wear deodorant.  Do not bring valuables to the hospital. Contacts, dentures or bridgework may not be worn into surgery.  Patients discharged on the day of surgery will not be allowed to drive home.  Home with husband Liliane Channel cell [phone].

## 2013-10-24 ENCOUNTER — Ambulatory Visit (HOSPITAL_COMMUNITY)
Admission: RE | Admit: 2013-10-24 | Discharge: 2013-10-24 | Disposition: A | Discharge status: Home / Self Care | Payer: No Typology Code available for payment source | Source: Ambulatory Visit | Attending: Obstetrics and Gynecology | Admitting: Obstetrics and Gynecology

## 2013-10-24 ENCOUNTER — Encounter (HOSPITAL_COMMUNITY)
Admission: RE | Disposition: A | Discharge status: Home / Self Care | Payer: Self-pay | Source: Ambulatory Visit | Attending: Obstetrics and Gynecology

## 2013-10-24 ENCOUNTER — Encounter (HOSPITAL_COMMUNITY): Payer: No Typology Code available for payment source | Admitting: Anesthesiology

## 2013-10-24 ENCOUNTER — Encounter (HOSPITAL_COMMUNITY): Payer: Self-pay | Admitting: *Deleted

## 2013-10-24 ENCOUNTER — Ambulatory Visit (HOSPITAL_COMMUNITY): Payer: No Typology Code available for payment source | Admitting: Anesthesiology

## 2013-10-24 DIAGNOSIS — I059 Rheumatic mitral valve disease, unspecified: Secondary | ICD-10-CM | POA: Insufficient documentation

## 2013-10-24 DIAGNOSIS — Z87891 Personal history of nicotine dependence: Secondary | ICD-10-CM | POA: Insufficient documentation

## 2013-10-24 DIAGNOSIS — K219 Gastro-esophageal reflux disease without esophagitis: Secondary | ICD-10-CM | POA: Insufficient documentation

## 2013-10-24 DIAGNOSIS — E785 Hyperlipidemia, unspecified: Secondary | ICD-10-CM | POA: Insufficient documentation

## 2013-10-24 DIAGNOSIS — I10 Essential (primary) hypertension: Secondary | ICD-10-CM | POA: Insufficient documentation

## 2013-10-24 DIAGNOSIS — N840 Polyp of corpus uteri: Secondary | ICD-10-CM

## 2013-10-24 DIAGNOSIS — Z8673 Personal history of transient ischemic attack (TIA), and cerebral infarction without residual deficits: Secondary | ICD-10-CM | POA: Insufficient documentation

## 2013-10-24 HISTORY — PX: DILATATION & CURETTAGE/HYSTEROSCOPY WITH TRUECLEAR: SHX6353

## 2013-10-24 SURGERY — DILATATION & CURETTAGE/HYSTEROSCOPY WITH TRUCLEAR
Anesthesia: General | Site: Uterus

## 2013-10-24 MED ORDER — FENTANYL CITRATE 0.05 MG/ML IJ SOLN
INTRAMUSCULAR | Status: AC
  Administered 2013-10-24: 25 ug via INTRAVENOUS
  Filled 2013-10-24: qty 2

## 2013-10-24 MED ORDER — LACTATED RINGERS IV SOLN
INTRAVENOUS | Status: DC
  Administered 2013-10-24 (×4): via INTRAVENOUS

## 2013-10-24 MED ORDER — PROMETHAZINE HCL 25 MG/ML IJ SOLN: 6.2500 mg | INTRAMUSCULAR | Status: DC | PRN

## 2013-10-24 MED ORDER — LIDOCAINE HCL (CARDIAC) 20 MG/ML IV SOLN
INTRAVENOUS | Status: DC | PRN
  Administered 2013-10-24: 10 mg via INTRAVENOUS
  Administered 2013-10-24: 50 mg via INTRAVENOUS

## 2013-10-24 MED ORDER — MEPERIDINE HCL 25 MG/ML IJ SOLN: 6.2500 mg | INTRAMUSCULAR | Status: DC | PRN

## 2013-10-24 MED ORDER — ONDANSETRON HCL 4 MG/2ML IJ SOLN
INTRAMUSCULAR | Status: DC | PRN
  Administered 2013-10-24: 4 mg via INTRAVENOUS

## 2013-10-24 MED ORDER — LIDOCAINE HCL (CARDIAC) 20 MG/ML IV SOLN
INTRAVENOUS | Status: AC
  Filled 2013-10-24: qty 5

## 2013-10-24 MED ORDER — MIDAZOLAM HCL 2 MG/2ML IJ SOLN
INTRAMUSCULAR | Status: AC
  Filled 2013-10-24: qty 2

## 2013-10-24 MED ORDER — MIDAZOLAM HCL 2 MG/2ML IJ SOLN
INTRAMUSCULAR | Status: DC | PRN
  Administered 2013-10-24: 2 mg via INTRAVENOUS

## 2013-10-24 MED ORDER — MIDAZOLAM HCL 2 MG/2ML IJ SOLN: 0.5000 mg | Freq: Once | INTRAMUSCULAR | Status: DC | PRN

## 2013-10-24 MED ORDER — FENTANYL CITRATE 0.05 MG/ML IJ SOLN
INTRAMUSCULAR | Status: AC
  Filled 2013-10-24: qty 2

## 2013-10-24 MED ORDER — LIDOCAINE HCL 1 % IJ SOLN
INTRAMUSCULAR | Status: DC | PRN
  Administered 2013-10-24: 20 mL

## 2013-10-24 MED ORDER — OXYCODONE-ACETAMINOPHEN 5-325 MG PO TABS: 1.0000 | ORAL_TABLET | ORAL | Status: DC | PRN

## 2013-10-24 MED ORDER — SODIUM CHLORIDE 0.9 % IR SOLN
Status: DC | PRN
  Administered 2013-10-24: 3000 mL

## 2013-10-24 MED ORDER — PHENYLEPHRINE 40 MCG/ML (10ML) SYRINGE FOR IV PUSH (FOR BLOOD PRESSURE SUPPORT)
PREFILLED_SYRINGE | INTRAVENOUS | Status: AC
  Filled 2013-10-24: qty 5

## 2013-10-24 MED ORDER — PROPOFOL 10 MG/ML IV EMUL
INTRAVENOUS | Status: DC | PRN
  Administered 2013-10-24: 150 mg via INTRAVENOUS
  Administered 2013-10-24: 50 mg via INTRAVENOUS

## 2013-10-24 MED ORDER — FENTANYL CITRATE 0.05 MG/ML IJ SOLN
INTRAMUSCULAR | Status: DC | PRN
  Administered 2013-10-24: 100 ug via INTRAVENOUS

## 2013-10-24 MED ORDER — LIDOCAINE HCL (CARDIAC) 20 MG/ML IV SOLN
INTRAVENOUS | Status: DC | PRN
  Administered 2013-10-24: 100 mg via INTRAVENOUS

## 2013-10-24 MED ORDER — ONDANSETRON HCL 4 MG/2ML IJ SOLN
INTRAMUSCULAR | Status: AC
  Filled 2013-10-24: qty 2

## 2013-10-24 MED ORDER — PROPOFOL 10 MG/ML IV EMUL
INTRAVENOUS | Status: AC
  Filled 2013-10-24: qty 40

## 2013-10-24 MED ORDER — FENTANYL CITRATE 0.05 MG/ML IJ SOLN
25.0000 ug | INTRAMUSCULAR | Status: DC | PRN
  Administered 2013-10-24: 50 ug via INTRAVENOUS
  Administered 2013-10-24: 25 ug via INTRAVENOUS

## 2013-10-24 MED ORDER — LIDOCAINE HCL 1 % IJ SOLN
INTRAMUSCULAR | Status: AC
Start: 2013-10-24 — End: 2013-10-24
  Filled 2013-10-24: qty 20

## 2013-10-24 SURGICAL SUPPLY — 20 items
BLADE INCISOR TRUC PLUS 2.9 (ABLATOR) ×1 IMPLANT
CANISTERS HI-FLOW 3000CC (CANNISTER) ×4 IMPLANT
CATH ROBINSON RED A/P 16FR (CATHETERS) ×2 IMPLANT
CLOTH BEACON ORANGE TIMEOUT ST (SAFETY) ×2 IMPLANT
CONTAINER PREFILL 10% NBF 60ML (FORM) ×2 IMPLANT
DECANTER SPIKE VIAL GLASS SM (MISCELLANEOUS) ×2 IMPLANT
DRAPE HYSTEROSCOPY (DRAPE) ×2 IMPLANT
GLOVE BIO SURGEON STRL SZ 6.5 (GLOVE) ×2 IMPLANT
GLOVE BIOGEL PI IND STRL 6.5 (GLOVE) ×1 IMPLANT
GLOVE BIOGEL PI IND STRL 7.0 (GLOVE) ×6 IMPLANT
GLOVE BIOGEL PI INDICATOR 6.5 (GLOVE) ×1
GLOVE BIOGEL PI INDICATOR 7.0 (GLOVE) ×6
GLOVE SURG SS PI 7.0 STRL IVOR (GLOVE) ×14 IMPLANT
GOWN STRL REUS W/TWL LRG LVL3 (GOWN DISPOSABLE) ×4 IMPLANT
INCISOR TRUC PLUS BLADE 2.9 (ABLATOR) ×2
KIT HYSTEROSCOPY TRUCLEAR (ABLATOR) ×2 IMPLANT
PACK VAGINAL MINOR WOMEN LF (CUSTOM PROCEDURE TRAY) ×2 IMPLANT
PAD OB MATERNITY 4.3X12.25 (PERSONAL CARE ITEMS) ×4 IMPLANT
TOWEL OR 17X24 6PK STRL BLUE (TOWEL DISPOSABLE) ×4 IMPLANT
WATER STERILE IRR 1000ML POUR (IV SOLUTION) ×2 IMPLANT

## 2013-10-24 NOTE — Anesthesia Procedure Notes (Signed)
Procedure Name: LMA Insertion Date/Time: 10/24/2013 7:28 AM Performed by: Khamari Sheehan, Sheron Nightingale Pre-anesthesia Checklist: Patient identified, Patient being monitored, Emergency Drugs available, Timeout performed and Suction available Patient Re-evaluated:Patient Re-evaluated prior to inductionOxygen Delivery Method: Circle system utilized, Nasal cannula, Simple face mask, Ambu bag and Non-rebreather mask Preoxygenation: Pre-oxygenation with 100% oxygen Intubation Type: IV induction LMA: LMA inserted LMA Size: 4.0 Tube size: 4.0 mm Number of attempts: 1

## 2013-10-24 NOTE — Discharge Instructions (Signed)

## 2013-10-24 NOTE — Transfer of Care (Signed)
Immediate Anesthesia Transfer of Care Note  Patient: Brianna Simon  Procedure(s) Performed: Procedure(s) with comments: DILATATION & CURETTAGE/HYSTEROSCOPY WITH TRUCLEAR (N/A) - 1 1/2hrs OR time  Patient Location: PACU  Anesthesia Type:General  Level of Consciousness: awake, alert  and oriented  Airway & Oxygen Therapy: Patient Spontanous Breathing and Patient connected to nasal cannula oxygen  Post-op Assessment: Report given to PACU RN and Post -op Vital signs reviewed and stable  Post vital signs: Reviewed and stable  Complications: No apparent anesthesia complications

## 2013-10-24 NOTE — Anesthesia Preprocedure Evaluation (Signed)
Anesthesia Evaluation  Patient identified by MRN, date of birth, ID band Patient awake    Reviewed: Allergy & Precautions, H&P , Patient's Chart, lab work & pertinent test results, reviewed documented beta blocker date and time   History of Anesthesia Complications Negative for: history of anesthetic complications  Airway Mallampati: II TM Distance: >3 FB Neck ROM: full    Dental   Pulmonary former smoker,  breath sounds clear to auscultation        Cardiovascular Exercise Tolerance: Good hypertension, + Valvular Problems/Murmurs MVP Rhythm:regular Rate:Normal     Neuro/Psych PSYCHIATRIC DISORDERS Anxiety CVA negative psych ROS   GI/Hepatic GERD-  Controlled,  Endo/Other    Renal/GU      Musculoskeletal   Abdominal   Peds  Hematology   Anesthesia Other Findings   Reproductive/Obstetrics                           Anesthesia Physical Anesthesia Plan  ASA: III  Anesthesia Plan: General LMA   Post-op Pain Management:    Induction:   Airway Management Planned:   Additional Equipment:   Intra-op Plan:   Post-operative Plan:   Informed Consent: I have reviewed the patients History and Physical, chart, labs and discussed the procedure including the risks, benefits and alternatives for the proposed anesthesia with the patient or authorized representative who has indicated his/her understanding and acceptance.   Dental Advisory Given  Plan Discussed with: CRNA, Surgeon and Anesthesiologist  Anesthesia Plan Comments:         Anesthesia Quick Evaluation

## 2013-10-24 NOTE — Progress Notes (Signed)
Patient ID: Brianna Simon, female   DOB: 12/24/1950, 63 y.o.   MRN: [id number] Per pt no changes in dictated H&P.  Brief exam WNL.

## 2013-10-24 NOTE — Anesthesia Postprocedure Evaluation (Signed)
  Anesthesia Post-op Note  Patient: Brianna Simon  Procedure(s) Performed: Procedure(s) with comments: DILATATION & CURETTAGE/HYSTEROSCOPY WITH TRUCLEAR (N/A) - 1 1/2hrs OR time Patient is awake and responsive. Pain and nausea are reasonably well controlled. Vital signs are stable and clinically acceptable. Oxygen saturation is clinically acceptable. There are no apparent anesthetic complications at this time. Patient is ready for discharge.

## 2013-10-24 NOTE — OR Nursing (Signed)
Dc to home skin warm dry pink resp even and unlabored .  No co discomfort. Darin Engels rn

## 2013-10-24 NOTE — Op Note (Signed)
Operative note  Preoperative diagnosis Endometrial polyps noted on ultrasound History of unopposed estrogen use for 3 years  Postoperative diagnosis Same  Procedure Hysteroscopy with polypectomy performed with truclear device  Surgeon Dr. Paula Compton  Anesthesia LMA  Fluids Estimated blood loss minimal Urine output 100 cc straight catheter prior procedure IV fluid 800 cc LR  Findings The endometrial cavity was atrophic posteriorly and anteriorly the fundus had 2 large sessile appearing polyps. The remainder of the cavity appeared normal.  Specimen Endometrial polyps and sampling was sent to pathology  Procedure note Patient was taken to the operating room where LMA anesthesia was obtained without difficulty she was then prepped and draped in the normal sterile fashion in the dorsal lithotomy position. An appropriate time out was performed. A speculum was placed within the vagina and the cervix injected on the anterior lip with 1% plain lidocaine. A paracervical block was then performed with approximately 9 cc each of 1% lidocaine plain at 2 and 10:00. The cervix was then easily sequentially dilated to 21 with the [facility] dilators. The hysteroscope was then calibrated and introduced into the uterine cavity. The findings were as stated with the sessile polyps noted on the anterior uterine wall. The true clear blade was then inserted through the hysteroscope and the polyps removed under suction with no complications. Once this had been concluded the hysteroscope was weaned removed and there was no active bleeding noted. The tenaculum was removed and likewise no bleeding noted. The patient was then awakened and taken to the recovery room in good condition.

## 2013-10-27 ENCOUNTER — Encounter (HOSPITAL_COMMUNITY): Payer: Self-pay | Admitting: Obstetrics and Gynecology

## 2013-12-23 ENCOUNTER — Other Ambulatory Visit: Payer: Self-pay | Admitting: Internal Medicine

## 2014-01-05 ENCOUNTER — Ambulatory Visit: Payer: No Typology Code available for payment source | Admitting: Family Medicine

## 2014-01-23 ENCOUNTER — Ambulatory Visit: Payer: No Typology Code available for payment source | Admitting: Family Medicine

## 2014-02-09 ENCOUNTER — Encounter: Payer: Self-pay | Admitting: Neurology

## 2014-02-09 ENCOUNTER — Ambulatory Visit: Payer: 59 | Admitting: Nurse Practitioner

## 2014-02-09 ENCOUNTER — Ambulatory Visit (INDEPENDENT_AMBULATORY_CARE_PROVIDER_SITE_OTHER): Payer: 59 | Admitting: Neurology

## 2014-02-09 VITALS — BP 148/79 | HR 60 | Wt 138.4 lb

## 2014-02-09 DIAGNOSIS — I679 Cerebrovascular disease, unspecified: Secondary | ICD-10-CM

## 2014-02-09 NOTE — Progress Notes (Signed)
[facility] [address] [place]. [place] [address] [phone]       OFFICE FOLLOW-UP NOTE  [name] Date of Birth:  08-23-1950 Medical Record Number:  [id number]   HPI: 89 year Caucasian lady with remote small left pareital embolic infarct in January 2011 without definite identified source of embolism.Vascular risk factors of Hypertension and Hyperlipidimia. Mild chronic lower extremities paresthesias  Update 02/10/2013  She returns for f/u after last visit on 01/16/2012.She remains stable without any new stroke or TIA signs and symptoms.She is tolerating aspirin well without side effects and BP is well controlled and last lipid profile in February this year was fine. She has mild paresthesias in her feet which appear unchanged from last visit.She has minor nasal bleeds related to her sinuses after sneezing but no unprovoked bleeding.Shehas intermittent occasional blurred vision and dizziness without vertigo. UPDATE 02/09/2014 : She returns for followup of the last visit 1 year ago. She continues to do well and has remained stroke symptom free since January 2011. She continues to take aspirin and states her blood pressure is under good control though it is slightly elevated at 148/79 in office today. She had her last lipid profile checked less than a year ago and was fine. She is tolerating her medications well without any side effects. She had it last carotid ultrasound and also a year ago. She had no new complaints today. ROS:   14 system review of systems is positive for ringing in ears, environmental allergies, joint pain, back pain, snoring, moles, depression, numbness, memory loss and bruising. PMH:  Past Medical History  Diagnosis Date  . Hypertension   . Mitral valve prolapse     does not cause patient any problems  . Hx: UTI (urinary tract infection)   . Anxiety   . GERD (gastroesophageal reflux disease)   . Hyperlipidemia   . Stroke 2011    mild  .  Seasonal allergies   . Arthritis     hips, hands, lower back - otc med prn  . Cancer     skin cancer left wrist and left chest  . SVD (spontaneous vaginal delivery)     x 1 - gave up for adoption    Social History:  History   Social History  . Marital Status: Married    Spouse Name: N/A    Number of Children: N/A  . Years of Education: N/A   Occupational History  . Not on file.   Social History Main Topics  . Smoking status: Former Smoker -- 2.00 packs/day for 30 years    Types: Cigarettes    Quit date: 03/17/2003  . Smokeless tobacco: Never Used  . Alcohol Use: Yes     Comment: occasionally  . Drug Use: No  . Sexual Activity: Yes    Birth Control/ Protection: Post-menopausal   Other Topics Concern  . Not on file   Social History Narrative   Caffeine Use: daily    Medications:   Current Outpatient Prescriptions on File Prior to Visit  Medication Sig Dispense Refill  . aspirin 325 MG tablet Take 162.5 mg by mouth daily.      . cetirizine (ZYRTEC) 10 MG tablet Take 10 mg by mouth daily.      . hydrochlorothiazide (HYDRODIURIL) 25 MG tablet TAKE 1/2 BY MOUTH DAILY  45 tablet  3  . Multiple Vitamin (MULTIVITAMIN WITH MINERALS) TABS tablet Take 1 tablet by mouth daily.      [name] oxyCODONE-acetaminophen (  ROXICET) 5-325 MG per tablet Take 1 tablet by mouth every 4 (four) hours as needed for severe pain.  15 tablet  0  . pantoprazole (PROTONIX) 40 MG tablet Take 1 tablet (40 mg total) by mouth daily.  90 tablet  3  . sertraline (ZOLOFT) 100 MG tablet TAKE 1 TABLET BY MOUTH DAILY  90 tablet  3  . simvastatin (ZOCOR) 20 MG tablet TAKE 1 TABLET BY MOUTH AT BEDTIME  90 tablet  0  . VITAMIN D, CHOLECALCIFEROL, PO Take by mouth.       No current facility-administered medications on file prior to visit.    Allergies:   Allergies  Allergen Reactions  . Citalopram Hydrobromide     REACTION: blurred vision  . Codeine Phosphate     Liquid formulation only bothers her; tolerates  percocet fine.  REACTION: nausea,vomiting  . Nitrofurantoin     REACTION: itching  . Nsaids Other (See Comments)    Elevated liver panel  . Sulfamethoxazole-Trimethoprim     REACTION: itching   Filed Vitals:   02/09/14 1024  BP: 148/79  Pulse: 60     Physical Exam General: well developed, well nourished, seated, in no evident distress Head: head normocephalic and atraumatic. Orohparynx benign Neck: supple with no carotid or supraclavicular bruits Cardiovascular: regular rate and rhythm, no murmurs Musculoskeletal: no deformity Skin:  no rash/petichiae Vascular:  Normal pulses all extremities Filed Vitals:   02/09/14 1024  BP: 148/79  Pulse: 60    Neurologic Exam Mental Status: Awake and fully alert. Oriented to place and time. Recent and remote memory intact. Attention span, concentration and fund of knowledge appropriate. Mood and affect appropriate.  Cranial Nerves: Fundoscopic exam not done. Pupils equal, briskly reactive to light. Extraocular movements full without nystagmus. Visual fields full to confrontation. Hearing intact. Facial sensation intact. Face, tongue, palate moves normally and symmetrically.  Motor: Normal bulk and tone. Normal strength in all tested extremity muscles. Sensory.: intact to touch and pinprick and vibratory sensation.  Coordination: Rapid alternating movements normal in all extremities. Finger-to-nose and heel-to-shin performed accurately bilaterally. Gait and Station: Arises from chair without difficulty. Stance is normal. Gait demonstrates normal stride length and balance . Able to heel, toe and tandem walk without difficulty.  Reflexes: 1+ and symmetric. Toes downgoing.      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 Mild chronic lower extremities paresthesias which appear  stable.    PLAN: Continue aspirin 162 mg daily and strict control of hypertension with blood pressure goal below 130/90 and lipids with LDL cholesterol goal below 100 mg percent. Check followup   carotid ultrasound.  Followup in the future only if needed and no routine f/u appointment is necessary. y

## 2014-02-09 NOTE — Patient Instructions (Addendum)
Continue aspirin 162 mg orally every day  for secondary stroke prevention and maintain strict control of hypertension with blood pressure goal below 130/90, diabetes with hemoglobin A1c goal below 6.5% and lipids with LDL cholesterol goal below 100 mg/dL.Check screening f/u carotid ultrasound study. Followup in the future only if needed and no routine f/u appointment is necessary.

## 2014-02-19 ENCOUNTER — Ambulatory Visit (INDEPENDENT_AMBULATORY_CARE_PROVIDER_SITE_OTHER): Payer: 59

## 2014-02-19 DIAGNOSIS — I679 Cerebrovascular disease, unspecified: Secondary | ICD-10-CM

## 2014-03-09 ENCOUNTER — Ambulatory Visit (INDEPENDENT_AMBULATORY_CARE_PROVIDER_SITE_OTHER): Payer: No Typology Code available for payment source | Admitting: Family Medicine

## 2014-03-09 DIAGNOSIS — Z23 Encounter for immunization: Secondary | ICD-10-CM

## 2014-03-29 ENCOUNTER — Other Ambulatory Visit: Payer: Self-pay | Admitting: Internal Medicine

## 2014-04-16 ENCOUNTER — Encounter: Payer: Self-pay | Admitting: Family Medicine

## 2014-04-16 ENCOUNTER — Telehealth: Payer: Self-pay | Admitting: Internal Medicine

## 2014-04-16 ENCOUNTER — Ambulatory Visit (INDEPENDENT_AMBULATORY_CARE_PROVIDER_SITE_OTHER): Payer: No Typology Code available for payment source | Admitting: Family Medicine

## 2014-04-16 VITALS — BP 130/70 | HR 78 | Temp 98.4°F | Wt 137.0 lb

## 2014-04-16 DIAGNOSIS — J069 Acute upper respiratory infection, unspecified: Secondary | ICD-10-CM

## 2014-04-16 DIAGNOSIS — R509 Fever, unspecified: Secondary | ICD-10-CM

## 2014-04-16 NOTE — Patient Instructions (Addendum)
Upper Respiratory Infection The most important thing is to get a lot of rest and stay well hydrated.  If you develop fevers, worsening of symptoms beyond the time we discussed (next Monday), worsening of symptoms after they get better, please schedule a follow up visit or give [place] a call for advise. Call Me Monday if not better and i will call in a chest x-ray for you. OR if you get shortness of breath.   I would use the alka seltzer plus or robitussin CF in the daytime (prehaps breakfast, lunch, dinner) and then the robitussin at night.

## 2014-04-16 NOTE — Telephone Encounter (Signed)
Patient Information:  Caller Name: Anastassia  Phone: [phone]  Patient: Brianna Simon, Brianna Simon  Gender: Female  DOB: June 21, 1950  Age: 63 Years  PCP: Phoebe Sharps (Adults only, leaving end of July 2015)  Office Follow Up:  Does the office need to follow up with this patient?: YES.  Please review. appt scheduled by office for later today at 14:15  Instructions For The Office: N/A  RN Note:  Home care advice and call back parameters reviewed with patient.  Patient was scheduled by office for today at 14:15.  Please Review for appt time .   Symptoms  Reason For Call & Symptoms: Onset of illlness on Monday 04/13/14.  Fever onset on Wednesday 04/15/14, +fever 102.0, +congestion, productive tan cough, no runny nose, +bodyaches.  +coughing with restless sleep. +weak.  No difficulty breathing. Appt scheduled by office today 04/16/14 for 14:15  Reviewed Health History In EMR: Yes  Reviewed Medications In EMR: Yes  Reviewed Allergies In EMR: Yes  Reviewed Surgeries / Procedures: Yes  Date of Onset of Symptoms: 04/13/2014  Treatments Tried: Tylenol, mucinex, robitussin.  Treatments Tried Worked: No  Any Fever: Yes  Fever Taken: Oral  Fever Time Of Reading: 07:00:00  Fever Last Reading: 102.0  Guideline(s) Used:  Influenza - Seasonal  Disposition Per Guideline:   Go to Office Now  Reason For Disposition Reached:   Fever > 100.5 F (38.1 C) and over 23 years of age  Advice Given:  Treating the Symptoms of Flu  Fever, Muscle Aches, and Headache: For fever more than 101 F (38.3 C), muscle aches, and headaches, take acetaminophen every 4-6 hours (Adults 650 mg) OR ibuprofen every 6-8 hours (Adults 400-600 mg).  Cough: Use cough drops.  Treating the Symptoms of Flu  Fever, Muscle Aches, and Headache: For fever more than 101 F (38.3 C), muscle aches, and headaches, take acetaminophen every 4-6 hours (Adults 650 mg) OR ibuprofen every 6-8 hours (Adults 400-600 mg).  Cough: Use cough  drops.  Hydrate: Drink extra liquids. If the air in your home is dry, use a humidifier.  No Aspirin  : Do not use aspirin for treatment of fever or pain (Reason: there is an association between influenza and Reye syndrome).  Isolation is Needed Until After the Fever is Gone:   The CDC recommends that people with influenza-like illness remain at home until at least 24 hours after they are free of fever (100 F or 37.8C).  Do NOT go to work or school.  Do NOT go to church, child care centers, shopping, or other public places.  Do NOT shake hands.  Avoid close contact with others (hugging, kissing).  For all Fevers  Drink cold fluids to prevent dehydration.  Dress in 1 layer of lightweight clothing and sleep with 1 light blanket.  For fevers less than 101 F (38.3 C), fever medicines are usually not necessary.  Call Back If:  You become short of breath or worse.  Pain and Fever Medicines:  For pain or fever relief, take either acetaminophen or ibuprofen.  They are over-the-counter (OTC) drugs that help treat both fever and pain. You can buy them at the drugstore.  Treat fevers above 101 F (38.3 C). The goal of fever therapy is to bring the fever down to a comfortable level. Remember that fever medicine usually lowers fever 2 degrees F (1 - 1 1/2 degrees C).  Acetaminophen (e.g., Tylenol):  Regular Strength Tylenol: Take 650 mg (two 325 mg pills) by mouth  every 4-6 hours as needed. Each Regular Strength Tylenol pill has 325 mg of acetaminophen.  Ibuprofen (e.g., Motrin, Advil):  Take 400 mg (two 200 mg pills) by mouth every 6 hours.  Patient Will Follow Care Advice:  YES  Appointment Scheduled:  04/16/2014 14:15:00 Appointment Scheduled Provider:  Garret Reddish

## 2014-04-16 NOTE — Progress Notes (Signed)
  Garret Reddish, MD Phone: [phone]  Subjective:   Brianna Simon is a 63 y.o. year old very pleasant female patient who presents with the following:  Congestion/fever Started on Monday with congestion Each day has gotten worse with some aching and some fever up to 102. Upper chest burning with cough. Coughing up tannish phlegm. Fevers up to 100.4 yesterday and worse today. Feels like it is all in her chest. Took tylenol this morning when temp up to 102 about 8 am. No medications other than tylenol. She has some over-the-counter products which she asked my advice on.   Husband was sick last week and has improved into this week.  ROS- No sore throat. Chills. No sinus pressure or pain.   Past Medical History- quit smoking 11 years ago, HLD, CVA, mitral valve prolapse, Hypertension  Medications- reviewed and updated Current Outpatient Prescriptions  Medication Sig Dispense Refill  . aspirin 325 MG tablet Take 162.5 mg by mouth daily.    . cetirizine (ZYRTEC) 10 MG tablet Take 10 mg by mouth daily.    . hydrochlorothiazide (HYDRODIURIL) 25 MG tablet TAKE 1/2 TABLET BY MOUTH DAILY 45 tablet 0  . Multiple Vitamin (MULTIVITAMIN WITH MINERALS) TABS tablet Take 1 tablet by mouth daily.    . pantoprazole (PROTONIX) 40 MG tablet Take 1 tablet (40 mg total) by mouth daily. 90 tablet 3  . sertraline (ZOLOFT) 100 MG tablet TAKE 1 TABLET BY MOUTH DAILY 90 tablet 3  . simvastatin (ZOCOR) 20 MG tablet TAKE 1 TABLET BY MOUTH AT BEDTIME 90 tablet 0  . VITAMIN D, CHOLECALCIFEROL, PO Take by mouth.    . oxyCODONE-acetaminophen (ROXICET) 5-325 MG per tablet Take 1 tablet by mouth every 4 (four) hours as needed for severe pain. (Patient not taking: Reported on 04/16/2014) 15 tablet 0   No current facility-administered medications for this visit.    Objective: BP 130/70 mmHg  Pulse 78  Temp(Src) 98.4 F (36.9 C)  Wt 137 lb (62.143 kg)  SpO2 97% Gen: NAD, wearing mask, intermittent cough,  fatigued appearing HEENT: turbinates erythematous and swollen, oropharynx normal without pharyngeal exudate, TM normal bilaterally, Mucous membranes are moist. CV: RRR no murmurs rubs or gallops Lungs: CTAB no crackles, wheeze, rhonchi- some transmitted upper airway sounds that clear with cough. Abdomen: soft/nontender/nondistended/normal bowel sounds. Ext: no edema Skin: warm, dry, no rash   Assessment/Plan:  Upper Respiratory Infection Advised of symptomatic care (see AVS). Wrote a note for work. Doubt influenza given the gradual onset and sick contacts without influenza. Given reasons for return  Given feelings of chest congestion and some transmitted upper airway sounds, we discussed that if she has shortness of breath or persistence of symptoms in the next Monday we will get a chest x-ray.  Meds at home with following ingredients.  Tylenol, dextromethorhan, guafenesin/phenylephrine -robitussin CF, alka seltzer plus Mucinex-diphenhydramine, tylenol, phenylephrine

## 2014-04-20 ENCOUNTER — Encounter: Payer: Self-pay | Admitting: Family Medicine

## 2014-04-20 ENCOUNTER — Ambulatory Visit (INDEPENDENT_AMBULATORY_CARE_PROVIDER_SITE_OTHER)
Admission: RE | Admit: 2014-04-20 | Discharge: 2014-04-20 | Disposition: A | Discharge status: Home / Self Care | Payer: No Typology Code available for payment source | Source: Ambulatory Visit | Attending: Family Medicine | Admitting: Family Medicine

## 2014-04-20 ENCOUNTER — Ambulatory Visit (INDEPENDENT_AMBULATORY_CARE_PROVIDER_SITE_OTHER): Payer: No Typology Code available for payment source | Admitting: Family Medicine

## 2014-04-20 VITALS — BP 140/70 | Temp 99.4°F | Wt 131.0 lb

## 2014-04-20 DIAGNOSIS — R52 Pain, unspecified: Secondary | ICD-10-CM

## 2014-04-20 DIAGNOSIS — J029 Acute pharyngitis, unspecified: Secondary | ICD-10-CM

## 2014-04-20 DIAGNOSIS — R059 Cough, unspecified: Secondary | ICD-10-CM

## 2014-04-20 DIAGNOSIS — R05 Cough: Secondary | ICD-10-CM

## 2014-04-20 LAB — POCT INFLUENZA A/B
Influenza A, POC: NEGATIVE
Influenza B, POC: NEGATIVE

## 2014-04-20 LAB — POCT RAPID STREP A (OFFICE): Rapid Strep A Screen: NEGATIVE

## 2014-04-20 MED ORDER — BENZONATATE 100 MG PO CAPS: 100.0000 mg | ORAL_CAPSULE | Freq: Two times a day (BID) | ORAL | Status: DC | PRN

## 2014-04-20 MED ORDER — AMOXICILLIN-POT CLAVULANATE 875-125 MG PO TABS: 1.0000 | ORAL_TABLET | Freq: Two times a day (BID) | ORAL | Status: DC

## 2014-04-20 NOTE — Progress Notes (Signed)
  Garret Reddish, MD Phone: [phone]  Subjective:   Brianna Simon is a 63 y.o. year old very pleasant female patient who presents with the following:  Cough/Congestion/elevated temperature Started on Monday 11/30 with congestion. Seen last week and diagnosed with URI- patient had temp to 102, upper chest burning with cough, tannish productive cough, minimal to no sore throat.   Today, patient reports Sore throat portion became more prominent and more on left side. Her sinuses have become progressively tender. Sputum has changed to yellow green as well as nasal discharge. Took OTC meds we discussed at last visit. Husband continues to improve from his similar illness which was 5-7 days. She is dissapointed by her lack of improvement but does admit feels some better and temp in 99 range and not above 101 anymore.   ROS- No sore throat. Chills not as bad. No nausea/vomiting.   Past Medical History- quit smoking 11 years ago, HLD, CVA, mitral valve prolapse, Hypertension  Medications- reviewed and updated Current Outpatient Prescriptions  Medication Sig Dispense Refill  . aspirin 325 MG tablet Take 162.5 mg by mouth daily.    . cetirizine (ZYRTEC) 10 MG tablet Take 10 mg by mouth daily.    . hydrochlorothiazide (HYDRODIURIL) 25 MG tablet TAKE 1/2 TABLET BY MOUTH DAILY 45 tablet 0  . Multiple Vitamin (MULTIVITAMIN WITH MINERALS) TABS tablet Take 1 tablet by mouth daily.    . pantoprazole (PROTONIX) 40 MG tablet Take 1 tablet (40 mg total) by mouth daily. 90 tablet 3  . sertraline (ZOLOFT) 100 MG tablet TAKE 1 TABLET BY MOUTH DAILY 90 tablet 3  . simvastatin (ZOCOR) 20 MG tablet TAKE 1 TABLET BY MOUTH AT BEDTIME 90 tablet 0  . VITAMIN D, CHOLECALCIFEROL, PO Take by mouth.     No current facility-administered medications for this visit.    Objective: BP 140/70 mmHg  Temp(Src) 99.4 F (37.4 C)  Wt 131 lb (59.421 kg) Gen: NAD, no longer wearing mask, intermittent cough, fatigued  appearing (slightly improved appearance) HEENT: turbinates remain erythematous and swollen, pharynx with mild erythema without exudate-no lymphadenopathy on neck, TM normal bilaterally with some increased pressure, Mucous membranes are moist. Maxillary sinuses tender to palpation.  CV: RRR no murmurs rubs or gallops Lungs: CTAB no crackles, wheeze, rhonchi- no longer with transmitted upper airway sounds.  Abdomen: soft/nontender/nondistended/normal bowel sounds. Ext: no edema Skin: warm, dry, no rash   Assessment/Plan:  Cough/congestion/elevated temperature/sinus pressure Thought to be URI at last visit. Only mild improvement by day 8. Cough is most prominent features-trial tessalon pearls with codeine allergy. CXR to rule out pneumonia. Rx Augmentin if symptoms worse( as currently improving) or if fail to improve by day 10 (wednesday) as this would meet criteria for bacterial sinusitis (honestly I suspect this but does not fully meet criteria). F/u if takes course and does not resolve. Out of work through end of the week.

## 2014-04-20 NOTE — Patient Instructions (Addendum)
Rule out pneumonia with chest x-ray  Concern for bacterial sinus infection-start antibiotics if symptoms worsen or persist into Wednesday.   Tessalon pearls to see if they help with cough.   Strep test and flu test were negative.

## 2014-04-23 ENCOUNTER — Telehealth: Payer: Self-pay | Admitting: Family Medicine

## 2014-04-23 NOTE — Telephone Encounter (Signed)
Pt would results of chest xray done 12/7.  Pt sent by dr Yong Channel

## 2014-04-23 NOTE — Telephone Encounter (Signed)
Pls advise.  

## 2014-04-23 NOTE — Telephone Encounter (Signed)
Called and spoke with pt and pt is aware.  

## 2014-04-23 NOTE — Telephone Encounter (Signed)
I sent her a mychart message when it was completed. Chest x-ray was normal. No pneumonia.

## 2014-06-14 ENCOUNTER — Other Ambulatory Visit: Payer: Self-pay | Admitting: Internal Medicine

## 2014-06-30 ENCOUNTER — Other Ambulatory Visit: Payer: Self-pay

## 2014-06-30 DIAGNOSIS — Z1231 Encounter for screening mammogram for malignant neoplasm of breast: Secondary | ICD-10-CM

## 2014-08-03 ENCOUNTER — Ambulatory Visit
Admission: RE | Admit: 2014-08-03 | Discharge: 2014-08-03 | Disposition: A | Discharge status: Home / Self Care | Payer: 59 | Source: Ambulatory Visit

## 2014-08-03 DIAGNOSIS — Z1231 Encounter for screening mammogram for malignant neoplasm of breast: Secondary | ICD-10-CM

## 2014-09-09 ENCOUNTER — Other Ambulatory Visit: Payer: Self-pay | Admitting: Family Medicine

## 2014-10-27 ENCOUNTER — Encounter: Payer: Self-pay | Admitting: Family Medicine

## 2014-10-27 ENCOUNTER — Ambulatory Visit (INDEPENDENT_AMBULATORY_CARE_PROVIDER_SITE_OTHER): Payer: 59 | Admitting: Family Medicine

## 2014-10-27 VITALS — BP 118/70 | HR 73 | Temp 98.3°F | Wt 141.0 lb

## 2014-10-27 DIAGNOSIS — I10 Essential (primary) hypertension: Secondary | ICD-10-CM | POA: Diagnosis not present

## 2014-10-27 DIAGNOSIS — Z23 Encounter for immunization: Secondary | ICD-10-CM

## 2014-10-27 DIAGNOSIS — F411 Generalized anxiety disorder: Secondary | ICD-10-CM | POA: Insufficient documentation

## 2014-10-27 DIAGNOSIS — J309 Allergic rhinitis, unspecified: Secondary | ICD-10-CM | POA: Insufficient documentation

## 2014-10-27 DIAGNOSIS — Z8673 Personal history of transient ischemic attack (TIA), and cerebral infarction without residual deficits: Secondary | ICD-10-CM

## 2014-10-27 DIAGNOSIS — F329 Major depressive disorder, single episode, unspecified: Secondary | ICD-10-CM

## 2014-10-27 DIAGNOSIS — F32A Depression, unspecified: Secondary | ICD-10-CM

## 2014-10-27 DIAGNOSIS — Z87891 Personal history of nicotine dependence: Secondary | ICD-10-CM

## 2014-10-27 DIAGNOSIS — E785 Hyperlipidemia, unspecified: Secondary | ICD-10-CM

## 2014-10-27 DIAGNOSIS — Z20828 Contact with and (suspected) exposure to other viral communicable diseases: Secondary | ICD-10-CM

## 2014-10-27 DIAGNOSIS — K219 Gastro-esophageal reflux disease without esophagitis: Secondary | ICD-10-CM

## 2014-10-27 MED ORDER — SIMVASTATIN 20 MG PO TABS: 20.0000 mg | ORAL_TABLET | Freq: Every day | ORAL | Status: DC

## 2014-10-27 MED ORDER — PANTOPRAZOLE SODIUM 40 MG PO TBEC: 40.0000 mg | DELAYED_RELEASE_TABLET | Freq: Every day | ORAL | Status: DC

## 2014-10-27 MED ORDER — HYDROCHLOROTHIAZIDE 25 MG PO TABS: 12.5000 mg | ORAL_TABLET | Freq: Every day | ORAL | Status: DC

## 2014-10-27 MED ORDER — SERTRALINE HCL 100 MG PO TABS: ORAL_TABLET | ORAL | Status: DC

## 2014-10-27 NOTE — Progress Notes (Signed)
Garret Reddish, MD  Subjective:  Brianna Simon is a 64 y.o. year old very pleasant female patient who presents with:  See problem oriented charting- ROS- no blurry vision, extremity weakness, slurred words, trouble swallowing, chest pain, shortness of breath, palpitations. No si/HI  Past Medical History- history CVA 2011 on aspirin, HLD, HTn, anxiety, GERD, allergic rhinitis  Medications- reviewed and updated Current Outpatient Prescriptions  Medication Sig Dispense Refill  . aspirin 325 MG tablet Take 162.5 mg by mouth daily.    . cetirizine (ZYRTEC) 10 MG tablet Take 10 mg by mouth daily.    . hydrochlorothiazide (HYDRODIURIL) 25 MG tablet TAKE 1/2 TABLET BY MOUTH DAILY 45 tablet 0  . Multiple Vitamin (MULTIVITAMIN WITH MINERALS) TABS tablet Take 1 tablet by mouth daily.    . pantoprazole (PROTONIX) 40 MG tablet Take 1 tablet (40 mg total) by mouth daily. 90 tablet 3  . sertraline (ZOLOFT) 100 MG tablet TAKE 1 TABLET BY MOUTH DAILY 90 tablet 3  . simvastatin (ZOCOR) 20 MG tablet TAKE 1 TABLET BY MOUTH AT BEDTIME 90 tablet 2  . VITAMIN D, CHOLECALCIFEROL, PO Take by mouth.     No current facility-administered medications for this visit.   Objective: BP 118/70 mmHg  Pulse 73  Temp(Src) 98.3 F (36.8 C)  Wt 141 lb (63.957 kg) Gen: NAD, resting comfortably CV: RRR no murmurs rubs or gallops Lungs: CTAB no crackles, wheeze, rhonchi Abdomen: soft/nontender/nondistended/normal bowel sounds. No rebound or guarding.  Ext: no edema Skin: warm, dry, no rash  Neuro: grossly normal, moves all extremities, normal gait   Assessment/Plan:  History of CVA (cerebrovascular accident) S:  CVA 2011, released from Dr. Leonie Man this year. Mild R sided paresthesias A/P:continue risk factor modification with ASA, BP control, lipid control   Hyperlipidemia S: controlled in past on simvastatin 20mg  A/P: check lipids today, continue current rx   Essential hypertension S: Well controlled on  HCTZ 12.5mg  A/P:Continue current meds.     Anxiety state S: very difficult to manage stress, agitation off zoloft. Gets anxious about situations A/P: continue zoloft 100mg    Esophageal reflux S: minimal symptoms as long as compliant with protonix 40mg  A/P: contiue current rx   6-9 months for med check  Update fasting labs today Orders Placed This Encounter  Procedures  . Tdap vaccine greater than or equal to 7yo IM  . Hepatitis C antibody, reflex    [facility]  . CBC with Differential/Platelet  . Comprehensive metabolic panel  . Lipid panel  . TSH  . POCT urinalysis dipstick    In house    Meds ordered this encounter  Medications  . hydrochlorothiazide (HYDRODIURIL) 25 MG tablet    Sig: Take 0.5 tablets (12.5 mg total) by mouth daily.    Dispense:  45 tablet    Refill:  3  . pantoprazole (PROTONIX) 40 MG tablet    Sig: Take 1 tablet (40 mg total) by mouth daily.    Dispense:  90 tablet    Refill:  3  . sertraline (ZOLOFT) 100 MG tablet    Sig: TAKE 1 TABLET BY MOUTH DAILY    Dispense:  90 tablet    Refill:  3  . simvastatin (ZOCOR) 20 MG tablet    Sig: Take 1 tablet (20 mg total) by mouth at bedtime.    Dispense:  90 tablet    Refill:  3

## 2014-10-27 NOTE — Assessment & Plan Note (Signed)
S:  CVA 2011, released from Dr. Leonie Man this year. Mild R sided paresthesias A/P:continue risk factor modification with ASA, BP control, lipid control

## 2014-10-27 NOTE — Patient Instructions (Addendum)
Health Maintenance Due  Topic Date Due  . PAP SMEAR -Sign release of information at the front desk for ob/gyn so they can send [place] a copy 03/15/2014  . TETANUS/TDAP - today 05/15/2014  Hep C screen today  Fasting labs today  Refill all medications  Follow up 6-9 months for med check

## 2014-10-27 NOTE — Assessment & Plan Note (Signed)
S: very difficult to manage stress, agitation off zoloft. Gets anxious about situations A/P: continue zoloft 100mg 

## 2014-10-27 NOTE — Assessment & Plan Note (Signed)
S: minimal symptoms as long as compliant with protonix 40mg  A/P: contiue current rx

## 2014-10-27 NOTE — Assessment & Plan Note (Signed)
S: controlled in past on simvastatin 20mg  A/P: check lipids today, continue current rx

## 2014-10-27 NOTE — Assessment & Plan Note (Signed)
S: Well controlled on HCTZ 12.5mg  A/P:Continue current meds.

## 2014-10-28 LAB — COMPREHENSIVE METABOLIC PANEL
ALBUMIN: 4.2 g/dL (ref 3.5–5.2)
ALT: 21 U/L (ref 0–35)
AST: 20 U/L (ref 0–37)
Alkaline Phosphatase: 103 U/L (ref 39–117)
BUN: 10 mg/dL (ref 6–23)
CHLORIDE: 102 meq/L (ref 96–112)
CO2: 28 mEq/L (ref 19–32)
Calcium: 9.2 mg/dL (ref 8.4–10.5)
Creat: 0.54 mg/dL (ref 0.50–1.10)
Glucose, Bld: 79 mg/dL (ref 70–99)
POTASSIUM: 3.5 meq/L (ref 3.5–5.3)
Sodium: 141 mEq/L (ref 135–145)
Total Bilirubin: 0.6 mg/dL (ref 0.2–1.2)
Total Protein: 7 g/dL (ref 6.0–8.3)

## 2014-10-28 LAB — CBC WITH DIFFERENTIAL/PLATELET
BASOS PCT: 0 % (ref 0–1)
Basophils Absolute: 0 10*3/uL (ref 0.0–0.1)
EOS ABS: 0.1 10*3/uL (ref 0.0–0.7)
EOS PCT: 3 % (ref 0–5)
HEMATOCRIT: 44.5 % (ref 36.0–46.0)
Hemoglobin: 15.1 g/dL — ABNORMAL HIGH (ref 12.0–15.0)
Lymphocytes Relative: 30 % (ref 12–46)
Lymphs Abs: 1.5 10*3/uL (ref 0.7–4.0)
MCH: 30.9 pg (ref 26.0–34.0)
MCHC: 33.9 g/dL (ref 30.0–36.0)
MCV: 91 fL (ref 78.0–100.0)
MONO ABS: 0.3 10*3/uL (ref 0.1–1.0)
MONOS PCT: 6 % (ref 3–12)
MPV: 9.7 fL (ref 8.6–12.4)
Neutro Abs: 3 10*3/uL (ref 1.7–7.7)
Neutrophils Relative %: 61 % (ref 43–77)
Platelets: 321 10*3/uL (ref 150–400)
RBC: 4.89 MIL/uL (ref 3.87–5.11)
RDW: 13.6 % (ref 11.5–15.5)
WBC: 4.9 10*3/uL (ref 4.0–10.5)

## 2014-10-28 LAB — LIPID PANEL
CHOL/HDL RATIO: 2.2 ratio
Cholesterol: 162 mg/dL (ref 0–200)
HDL: 74 mg/dL (ref >=46)
LDL Cholesterol: 76 mg/dL (ref 0–99)
TRIGLYCERIDES: 62 mg/dL (ref <150)
VLDL: 12 mg/dL (ref 0–40)

## 2014-10-28 LAB — TSH: TSH: 0.694 u[IU]/mL (ref 0.350–4.500)

## 2014-10-28 LAB — HEPATITIS C ANTIBODY: HCV Ab: NEGATIVE

## 2014-11-02 ENCOUNTER — Ambulatory Visit: Payer: 59 | Admitting: Family Medicine

## 2014-11-25 ENCOUNTER — Telehealth: Payer: Self-pay | Admitting: Family Medicine

## 2014-11-25 NOTE — Telephone Encounter (Signed)
Pt had shingle vaccine in nov 2014. Pt found result in mychart.

## 2014-11-26 NOTE — Telephone Encounter (Signed)
Vaccine is noted under Immunizations and Health Maintenance.

## 2014-12-08 ENCOUNTER — Telehealth: Payer: Self-pay | Admitting: Family Medicine

## 2014-12-08 NOTE — Telephone Encounter (Signed)
Pt fell Saturday  Night and has a bruise on her hip/buttlock. Can I use sda slot this week. Pt can not come in today

## 2014-12-08 NOTE — Telephone Encounter (Signed)
See below

## 2014-12-08 NOTE — Telephone Encounter (Signed)
Pt has been sch

## 2014-12-08 NOTE — Telephone Encounter (Signed)
Yes thanks 

## 2014-12-09 ENCOUNTER — Ambulatory Visit (INDEPENDENT_AMBULATORY_CARE_PROVIDER_SITE_OTHER): Payer: 59 | Admitting: Family Medicine

## 2014-12-09 ENCOUNTER — Encounter: Payer: Self-pay | Admitting: Family Medicine

## 2014-12-09 VITALS — BP 138/70 | HR 74 | Temp 98.3°F | Wt 146.0 lb

## 2014-12-09 DIAGNOSIS — T148 Other injury of unspecified body region: Secondary | ICD-10-CM | POA: Diagnosis not present

## 2014-12-09 DIAGNOSIS — M25552 Pain in left hip: Secondary | ICD-10-CM

## 2014-12-09 DIAGNOSIS — T148XXA Other injury of unspecified body region, initial encounter: Secondary | ICD-10-CM

## 2014-12-09 DIAGNOSIS — Z87891 Personal history of nicotine dependence: Secondary | ICD-10-CM | POA: Insufficient documentation

## 2014-12-09 NOTE — Patient Instructions (Signed)
Extensive left hip bruise  Given your activity after the fall- doubt fracture  We opted not to pursue x-ray  Use ice 15 minutes 3x a day for next 3 days  Then can use heat as needed  This may take up to a month to reabsorb  Let us know if new or  Worsening symptoms

## 2014-12-09 NOTE — Progress Notes (Signed)
Garret Reddish, MD  Subjective:  Brianna Simon is a 64 y.o. year old very pleasant female patient who presents with:  Left hip bruise -Saturday-Walking at [employer]. Went around outside of restaurant and pea gravel and had wedges on. Holding drink in hand. Lost balance and then lost balance and fell hard on left hip. Able to walk ok. Sore to touch. Not too bad of pain if just resting. Taking aspirin 1/2 a pill a day due to CVA history.   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   ROS- no difficulty walking, no leg swelling other than locally, no expanding redness or brusing after initial bruise  Past Medical History- former heavy smoker, CVA history, anxiety, HTN, HLD  Medications- reviewed and updated Current Outpatient Prescriptions  Medication Sig Dispense Refill  . aspirin 325 MG tablet Take 162.5 mg by mouth daily.    . cetirizine (ZYRTEC) 10 MG tablet Take 10 mg by mouth daily.    . hydrochlorothiazide (HYDRODIURIL) 25 MG tablet Take 0.5 tablets (12.5 mg total) by mouth daily. 45 tablet 3  . Multiple Vitamin (MULTIVITAMIN WITH MINERALS) TABS tablet Take 1 tablet by mouth daily.    . pantoprazole (PROTONIX) 40 MG tablet Take 1 tablet (40 mg total) by mouth daily. 90 tablet 3  . sertraline (ZOLOFT) 100 MG tablet TAKE 1 TABLET BY MOUTH DAILY 90 tablet 3  . simvastatin (ZOCOR) 20 MG tablet Take 1 tablet (20 mg total) by mouth at bedtime. 90 tablet 3  . VITAMIN D, CHOLECALCIFEROL, PO Take by mouth.     Objective: BP 138/70 mmHg  Pulse 74  Temp(Src) 98.3 F (36.8 C)  Wt 146 lb (66.225 kg) Gen: NAD, resting comfortably CV: RRR  Lungs: nonlabored Abdomen: soft/nontender/nondistended/normal bowel sounds.  Ext: no edema Skin: warm, dry, no rash 8x6 cm extensive bruise dark on left lateral hip below greater trochanter, some lighter bruising posterior to this Neuro: grossly normal, moves all extremities, intact distal sensation, good leg strength    Assessment/Plan:  Left hip bruise Extensive bruise. We strongly considered hip/femur films given extent of bruising but exam as far as pain is reassuring and patient has been active without difficulty. With CVA history, cannot hold aspirin. Discussed this may take extended period to heal. Ice for next 3 days and can try heat but not clear that either will help with resolution- may help slightly with irritation when area presses on something like side of a chair.   Return precautions advised. Willing to call in x-rays if worsening pain. No known history osteoporosis fortunately.   >50% of 20 minute office visit was spent on counseling (questions about level of bruising, follow up discussions, how to make it better, patient concerns about blood clots, and multiple other questions) and coordination of care

## 2014-12-21 ENCOUNTER — Encounter: Payer: Self-pay | Admitting: Family Medicine

## 2014-12-21 ENCOUNTER — Telehealth: Payer: Self-pay | Admitting: Family Medicine

## 2014-12-21 ENCOUNTER — Ambulatory Visit (INDEPENDENT_AMBULATORY_CARE_PROVIDER_SITE_OTHER): Payer: 59 | Admitting: Family Medicine

## 2014-12-21 VITALS — BP 130/82 | HR 82 | Temp 99.0°F | Wt 144.0 lb

## 2014-12-21 DIAGNOSIS — J029 Acute pharyngitis, unspecified: Secondary | ICD-10-CM

## 2014-12-21 NOTE — Patient Instructions (Signed)
We will check for strep throat though I think this is low likelihood. We will call with results  You have a few lymph nodes enlarged on the right. I suspect this is coming from either a viral sore throat, viral upper respiratory infection causing drainage, or possibly your body dealing with a temporary infection from the tooth (least likely).   May treat with salt water gargles and 2 tylenol twice a day. Also take your zyrtec while dealing with this.   If symptoms last through next Tuesday or Wednesday, let's reassess or sooner if you have fevers above 101 or worsening symptoms

## 2014-12-21 NOTE — Progress Notes (Signed)
Garret Reddish, MD  Subjective:  Brianna Simon is a 64 y.o. year old very pleasant female patient who presents with:  Sore throat -Saturday morning woke up with sore throat. Moderate aching primarily with swallowing. Tenderness to touch from outside. No sick contacts. Stable in course. Took cold medicine this weekend OTC. Didn't take any Sunday. States no runny nose, cough. Some fatigue. Stable in course  ROS- temps in 99s, no chills. No nausea/vomiting. No headache. No worsening sinus pressure.   Past Medical History- history CVA, HLD, former smoker, HTN, anxiety  Medications- reviewed and updated Current Outpatient Prescriptions  Medication Sig Dispense Refill  . aspirin 325 MG tablet Take 162.5 mg by mouth daily.    . cetirizine (ZYRTEC) 10 MG tablet Take 10 mg by mouth daily.    . hydrochlorothiazide (HYDRODIURIL) 25 MG tablet Take 0.5 tablets (12.5 mg total) by mouth daily. 45 tablet 3  . Multiple Vitamin (MULTIVITAMIN WITH MINERALS) TABS tablet Take 1 tablet by mouth daily.    . pantoprazole (PROTONIX) 40 MG tablet Take 1 tablet (40 mg total) by mouth daily. 90 tablet 3  . sertraline (ZOLOFT) 100 MG tablet TAKE 1 TABLET BY MOUTH DAILY 90 tablet 3  . simvastatin (ZOCOR) 20 MG tablet Take 1 tablet (20 mg total) by mouth at bedtime. 90 tablet 3  . VITAMIN D, CHOLECALCIFEROL, PO Take by mouth.     Objective: BP 130/82 mmHg  Pulse 82  Temp(Src) 99 F (37.2 C)  Wt 144 lb (65.318 kg) Gen: NAD, resting comfortably Mucous membranes are moist. No obvious dental abscess. Oropharynx largely normal although some drainage noted. TM normal. 2 small lymph nodes enlarged on right side of throat.  CV: RRR no murmurs rubs or gallops Lungs: CTAB no crackles, wheeze, rhonchi Abdomen: soft/nontender/nondistended/normal bowel sounds. Ext: no edema Skin: warm, dry Neuro: grossly normal, moves all extremities  Assessment/Plan:  Sore throat Rapid strep negative (Keba verbally reported but  do not see documented-will follow up in AM). Does have some drainage down throat- this could be Viral URI vs. Viral sore throat  (throat exam not overly impressive). Does also have some lymphadenopathy and a tooth that needs a crown- possible infection related. For now symptomatic treatment and f/u if persistent into next tues/wed.   Return precautions advised.

## 2014-12-21 NOTE — Telephone Encounter (Signed)
Husband is calling back to see if results are back. If so, he wants to know how contagious she is.

## 2014-12-21 NOTE — Telephone Encounter (Signed)
Pt has a sore throat on one side (right) of her throat and she thinks it may be b/c she has  Her tonsils. Hurts rally bad when she swallows. No temp. Would like to know if you can work in today. Pls advise

## 2014-12-21 NOTE — Telephone Encounter (Signed)
Pt has been scheduled. Thanks! Lm on vm about appt

## 2014-12-21 NOTE — Telephone Encounter (Signed)
Yes pt can come in at 11:30 or 1:00 today.

## 2014-12-22 NOTE — Telephone Encounter (Signed)
Called and lm on pt cell phone that strep was negative.

## 2014-12-22 NOTE — Progress Notes (Signed)
Called and lm on pt vm that  Strep was negative.

## 2015-01-28 ENCOUNTER — Encounter: Payer: Self-pay | Admitting: Internal Medicine

## 2015-02-01 ENCOUNTER — Encounter: Payer: Self-pay | Admitting: Internal Medicine

## 2015-02-05 ENCOUNTER — Ambulatory Visit (INDEPENDENT_AMBULATORY_CARE_PROVIDER_SITE_OTHER): Payer: 59 | Admitting: *Deleted

## 2015-02-05 DIAGNOSIS — Z23 Encounter for immunization: Secondary | ICD-10-CM | POA: Diagnosis not present

## 2015-02-26 ENCOUNTER — Inpatient Hospital Stay (HOSPITAL_COMMUNITY)
Admission: EM | Admit: 2015-02-26 | Discharge: 2015-03-02 | DRG: 042 | Disposition: A | Discharge status: Home / Self Care | Payer: 59 | Attending: Neurology | Admitting: Neurology

## 2015-02-26 ENCOUNTER — Emergency Department (HOSPITAL_COMMUNITY): Payer: 59

## 2015-02-26 DIAGNOSIS — K219 Gastro-esophageal reflux disease without esophagitis: Secondary | ICD-10-CM | POA: Diagnosis present

## 2015-02-26 DIAGNOSIS — I63412 Cerebral infarction due to embolism of left middle cerebral artery: Secondary | ICD-10-CM | POA: Diagnosis not present

## 2015-02-26 DIAGNOSIS — J302 Other seasonal allergic rhinitis: Secondary | ICD-10-CM | POA: Diagnosis present

## 2015-02-26 DIAGNOSIS — I6789 Other cerebrovascular disease: Secondary | ICD-10-CM | POA: Diagnosis not present

## 2015-02-26 DIAGNOSIS — R29810 Facial weakness: Secondary | ICD-10-CM | POA: Diagnosis present

## 2015-02-26 DIAGNOSIS — F419 Anxiety disorder, unspecified: Secondary | ICD-10-CM | POA: Diagnosis present

## 2015-02-26 DIAGNOSIS — I10 Essential (primary) hypertension: Secondary | ICD-10-CM | POA: Diagnosis present

## 2015-02-26 DIAGNOSIS — I6340 Cerebral infarction due to embolism of unspecified cerebral artery: Principal | ICD-10-CM | POA: Diagnosis present

## 2015-02-26 DIAGNOSIS — I341 Nonrheumatic mitral (valve) prolapse: Secondary | ICD-10-CM | POA: Diagnosis present

## 2015-02-26 DIAGNOSIS — Z8669 Personal history of other diseases of the nervous system and sense organs: Secondary | ICD-10-CM | POA: Diagnosis not present

## 2015-02-26 DIAGNOSIS — Z87891 Personal history of nicotine dependence: Secondary | ICD-10-CM | POA: Diagnosis not present

## 2015-02-26 DIAGNOSIS — R471 Dysarthria and anarthria: Secondary | ICD-10-CM | POA: Diagnosis present

## 2015-02-26 DIAGNOSIS — I639 Cerebral infarction, unspecified: Secondary | ICD-10-CM | POA: Insufficient documentation

## 2015-02-26 DIAGNOSIS — R4701 Aphasia: Secondary | ICD-10-CM | POA: Diagnosis not present

## 2015-02-26 DIAGNOSIS — Z8673 Personal history of transient ischemic attack (TIA), and cerebral infarction without residual deficits: Secondary | ICD-10-CM

## 2015-02-26 DIAGNOSIS — Z7982 Long term (current) use of aspirin: Secondary | ICD-10-CM | POA: Diagnosis not present

## 2015-02-26 DIAGNOSIS — Z8249 Family history of ischemic heart disease and other diseases of the circulatory system: Secondary | ICD-10-CM

## 2015-02-26 DIAGNOSIS — I6389 Other cerebral infarction: Secondary | ICD-10-CM

## 2015-02-26 DIAGNOSIS — E785 Hyperlipidemia, unspecified: Secondary | ICD-10-CM | POA: Diagnosis present

## 2015-02-26 DIAGNOSIS — C4490 Unspecified malignant neoplasm of skin, unspecified: Secondary | ICD-10-CM | POA: Diagnosis present

## 2015-02-26 DIAGNOSIS — E876 Hypokalemia: Secondary | ICD-10-CM | POA: Diagnosis present

## 2015-02-26 DIAGNOSIS — R002 Palpitations: Secondary | ICD-10-CM | POA: Diagnosis not present

## 2015-02-26 LAB — I-STAT CHEM 8, ED
BUN: 13 mg/dL (ref 6–20)
CHLORIDE: 105 mmol/L (ref 101–111)
Calcium, Ion: 1.18 mmol/L (ref 1.13–1.30)
Creatinine, Ser: 0.6 mg/dL (ref 0.44–1.00)
GLUCOSE: 108 mg/dL — AB (ref 65–99)
HCT: 45 % (ref 36.0–46.0)
Hemoglobin: 15.3 g/dL — ABNORMAL HIGH (ref 12.0–15.0)
POTASSIUM: 3.4 mmol/L — AB (ref 3.5–5.1)
Sodium: 143 mmol/L (ref 135–145)
TCO2: 22 mmol/L (ref 0–100)

## 2015-02-26 LAB — RAPID URINE DRUG SCREEN, HOSP PERFORMED
Amphetamines: NOT DETECTED
BENZODIAZEPINES: NOT DETECTED
Barbiturates: NOT DETECTED
Cocaine: NOT DETECTED
Opiates: NOT DETECTED
Tetrahydrocannabinol: NOT DETECTED

## 2015-02-26 LAB — COMPREHENSIVE METABOLIC PANEL
ALT: 22 U/L (ref 14–54)
AST: 24 U/L (ref 15–41)
Albumin: 3.8 g/dL (ref 3.5–5.0)
Alkaline Phosphatase: 109 U/L (ref 38–126)
Anion gap: 7 (ref 5–15)
BUN: 11 mg/dL (ref 6–20)
CHLORIDE: 106 mmol/L (ref 101–111)
CO2: 24 mmol/L (ref 22–32)
CREATININE: 0.67 mg/dL (ref 0.44–1.00)
Calcium: 8.9 mg/dL (ref 8.9–10.3)
GFR calc Af Amer: >60 mL/min (ref >60)
GFR calc non Af Amer: >60 mL/min (ref >60)
GLUCOSE: 110 mg/dL — AB (ref 65–99)
Potassium: 3.4 mmol/L — ABNORMAL LOW (ref 3.5–5.1)
SODIUM: 137 mmol/L (ref 135–145)
Total Bilirubin: 0.6 mg/dL (ref 0.3–1.2)
Total Protein: 6.6 g/dL (ref 6.5–8.1)

## 2015-02-26 LAB — ETHANOL

## 2015-02-26 LAB — DIFFERENTIAL
BASOS ABS: 0 10*3/uL (ref 0.0–0.1)
BASOS PCT: 1 %
Eosinophils Absolute: 0.1 10*3/uL (ref 0.0–0.7)
Eosinophils Relative: 2 %
Lymphocytes Relative: 34 %
Lymphs Abs: 2 10*3/uL (ref 0.7–4.0)
Monocytes Absolute: 0.5 10*3/uL (ref 0.1–1.0)
Monocytes Relative: 7 %
NEUTROS ABS: 3.4 10*3/uL (ref 1.7–7.7)
Neutrophils Relative %: 56 %

## 2015-02-26 LAB — CBC
HEMATOCRIT: 42.9 % (ref 36.0–46.0)
Hemoglobin: 14.3 g/dL (ref 12.0–15.0)
MCH: 30.4 pg (ref 26.0–34.0)
MCHC: 33.3 g/dL (ref 30.0–36.0)
MCV: 91.3 fL (ref 78.0–100.0)
PLATELETS: 252 10*3/uL (ref 150–400)
RBC: 4.7 MIL/uL (ref 3.87–5.11)
RDW: 12.8 % (ref 11.5–15.5)
WBC: 6.1 10*3/uL (ref 4.0–10.5)

## 2015-02-26 LAB — APTT: APTT: 30 s (ref 24–37)

## 2015-02-26 LAB — URINALYSIS, ROUTINE W REFLEX MICROSCOPIC
Bilirubin Urine: NEGATIVE
GLUCOSE, UA: NEGATIVE mg/dL
Hgb urine dipstick: NEGATIVE
KETONES UR: NEGATIVE mg/dL
NITRITE: NEGATIVE
PH: 6.5 (ref 5.0–8.0)
PROTEIN: NEGATIVE mg/dL
Specific Gravity, Urine: 1.007 (ref 1.005–1.030)
Urobilinogen, UA: 0.2 mg/dL (ref 0.0–1.0)

## 2015-02-26 LAB — MRSA PCR SCREENING: MRSA BY PCR: NEGATIVE

## 2015-02-26 LAB — URINE MICROSCOPIC-ADD ON

## 2015-02-26 LAB — GLUCOSE, CAPILLARY: Glucose-Capillary: 88 mg/dL (ref 65–99)

## 2015-02-26 LAB — I-STAT TROPONIN, ED: Troponin i, poc: 0 ng/mL (ref 0.00–0.08)

## 2015-02-26 LAB — PROTIME-INR
INR: 1.1 (ref 0.00–1.49)
PROTHROMBIN TIME: 14.4 s (ref 11.6–15.2)

## 2015-02-26 LAB — CBG MONITORING, ED: Glucose-Capillary: 96 mg/dL (ref 65–99)

## 2015-02-26 MED ORDER — ALTEPLASE (STROKE) FULL DOSE INFUSION
0.9000 mg/kg | Freq: Once | INTRAVENOUS | Status: AC
  Administered 2015-02-26: 60 mg via INTRAVENOUS
  Filled 2015-02-26: qty 60

## 2015-02-26 MED ORDER — PANTOPRAZOLE SODIUM 40 MG IV SOLR
40.0000 mg | Freq: Every day | INTRAVENOUS | Status: DC
  Administered 2015-02-26: 40 mg via INTRAVENOUS
  Filled 2015-02-26 (×2): qty 40

## 2015-02-26 MED ORDER — ACETAMINOPHEN 325 MG PO TABS: 650.0000 mg | ORAL_TABLET | ORAL | Status: DC | PRN

## 2015-02-26 MED ORDER — LABETALOL HCL 5 MG/ML IV SOLN: 10.0000 mg | INTRAVENOUS | Status: DC | PRN

## 2015-02-26 MED ORDER — LABETALOL HCL 5 MG/ML IV SOLN
10.0000 mg | Freq: Once | INTRAVENOUS | Status: DC
  Filled 2015-02-26: qty 4

## 2015-02-26 MED ORDER — STROKE: EARLY STAGES OF RECOVERY BOOK
Freq: Once | Status: AC
  Administered 2015-02-26: 22:00:00
  Filled 2015-02-26: qty 1

## 2015-02-26 MED ORDER — SODIUM CHLORIDE 0.9 % IV SOLN
INTRAVENOUS | Status: DC
  Administered 2015-02-26: 20:00:00 via INTRAVENOUS

## 2015-02-26 MED ORDER — SENNOSIDES-DOCUSATE SODIUM 8.6-50 MG PO TABS
1.0000 | ORAL_TABLET | Freq: Every evening | ORAL | Status: DC | PRN
  Filled 2015-02-26: qty 1

## 2015-02-26 MED ORDER — ACETAMINOPHEN 650 MG RE SUPP: 650.0000 mg | RECTAL | Status: DC | PRN

## 2015-02-26 NOTE — Code Documentation (Signed)
She was last known well at 1550 when she spoke with a clerk at the store.  At 1600 she noted tingling and numbness in her right arm.  At 1602 she called her husband and was unable to "get her words out".  EMS was notified.  Code Stroke called enroute at 1653.  She arrived at 1713, stat labs were drawn and stat head CT done.  NIHSS 5, unable to state age and month, expressive aphasia, mild dysarthria.  Husband at bedside consented to [facility] treatment.  Pharmacy at bedside, TPA bolus given at 1758.  Patient continues to have expressive aphasia.  Dr Ronnette Juniper at bedside to assess patient.  Plan admit to ICU.

## 2015-02-26 NOTE — ED Notes (Signed)
Pt arrived via EMS. 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 Started feeling numbness and tingling in right arm. Pt called husband and EMS. Pt has equal and strong grips. No pronator drift. Face is symmetrical when smiling. Speech unclear. Having a hard time getting words out. Pt had previous minor stroke in 2011.

## 2015-02-26 NOTE — ED Provider Notes (Signed)
CSN: [id number]     Arrival date & time 02/26/15  1713 History   First MD Initiated Contact with Patient 02/26/15 1730     Chief Complaint  Patient presents with  . Code Stroke    Level V caveat: Expressive aphasia  HPI  Patient called her husband after she developed numbness and tingling of her right upper extremity and difficulty speaking.  She has a prior history of stroke in 2011.  No recent injury or trauma.  She has ongoing difficulty with her speech.  No use of anticoagulants.  Last known well time 3:50 PM  Past Medical History  Diagnosis Date  . Hypertension   . Mitral valve prolapse     does not cause patient any problems  . Hx: UTI (urinary tract infection)   . Anxiety   . GERD (gastroesophageal reflux disease)   . Hyperlipidemia   . Stroke 2011    mild  . Seasonal allergies   . Arthritis     hips, hands, lower back - otc med prn  . Cancer     skin cancer left wrist and left chest  . SVD (spontaneous vaginal delivery)     x 1 - gave up for adoption   Past Surgical History  Procedure Laterality Date  . Colonoscopy    . Sinus surgery with instatrak    . Lipoma removed      right shoulder  . Wisdom tooth extraction    . Dilatation & curettage/hysteroscopy with trueclear N/A 10/24/2013    Procedure: DILATATION & CURETTAGE/HYSTEROSCOPY WITH TRUCLEAR;  Surgeon: Logan Bores, MD;  Location: [facility];  Service: Gynecology;  Laterality: N/A;  1 1/2hrs OR time   Family History  Problem Relation Age of Onset  . Heart disease Mother     pacemaker   . Diverticulitis Other    Social History  Substance Use Topics  . Smoking status: Former Smoker -- 2.00 packs/day for 30 years    Types: Cigarettes    Quit date: 03/17/2003  . Smokeless tobacco: Never Used  . Alcohol Use: Yes     Comment: occasionally   OB History    No data available     Review of Systems  Unable to perform ROS: Patient nonverbal      Allergies  Citalopram hydrobromide; Codeine  phosphate; Nitrofurantoin; Nsaids; and Sulfamethoxazole-trimethoprim  Home Medications   Prior to Admission medications   Medication Sig Start Date End Date Taking? Authorizing Provider  aspirin 325 MG tablet Take 162.5 mg by mouth daily.    Historical Provider, MD  cetirizine (ZYRTEC) 10 MG tablet Take 10 mg by mouth daily.    Historical Provider, MD  hydrochlorothiazide (HYDRODIURIL) 25 MG tablet Take 0.5 tablets (12.5 mg total) by mouth daily. 10/27/14   Marin Olp, MD  Multiple Vitamin (MULTIVITAMIN WITH MINERALS) TABS tablet Take 1 tablet by mouth daily.    Historical Provider, MD  pantoprazole (PROTONIX) 40 MG tablet Take 1 tablet (40 mg total) by mouth daily. 10/27/14   Marin Olp, MD  sertraline (ZOLOFT) 100 MG tablet TAKE 1 TABLET BY MOUTH DAILY 10/27/14   Marin Olp, MD  simvastatin (ZOCOR) 20 MG tablet Take 1 tablet (20 mg total) by mouth at bedtime. 10/27/14   Marin Olp, MD  VITAMIN D, CHOLECALCIFEROL, PO Take by mouth.    Historical Provider, MD   BP 188/75 mmHg  Pulse 76  Temp(Src) 97.9 F (36.6 C) (Oral)  Resp 18  Wt 147  lb 14.9 oz (67.1 kg)  SpO2 95% Physical Exam  Constitutional: She is oriented to person, place, and time. She appears well-developed and well-nourished. No distress.  HENT:  Head: Normocephalic and atraumatic.  Eyes: EOM are normal. Pupils are equal, round, and reactive to light.  Neck: Normal range of motion.  Cardiovascular: Normal rate, regular rhythm and normal heart sounds.   Pulmonary/Chest: Effort normal and breath sounds normal.  Abdominal: Soft. She exhibits no distension. There is no tenderness.  Musculoskeletal: Normal range of motion.  Neurological: She is alert and oriented to person, place, and time.  5/5 strength in major muscle groups of  bilateral upper and lower extremities.  No facial asymetry.  Expressive aphasia  Skin: Skin is warm and dry.  Psychiatric: She has a normal mood and affect. Judgment normal.   Nursing note and vitals reviewed.   ED Course  Procedures (including critical care time)  CRITICAL CARE Performed by: Hoy Morn Total critical care time: 30 Critical care time was exclusive of separately billable procedures and treating other patients. Critical care was necessary to treat or prevent imminent or life-threatening deterioration. Critical care was time spent personally by me on the following activities: development of treatment plan with patient and/or surrogate as well as nursing, discussions with consultants, evaluation of patient's response to treatment, examination of patient, obtaining history from patient or surrogate, ordering and performing treatments and interventions, ordering and review of laboratory studies, ordering and review of radiographic studies, pulse oximetry and re-evaluation of patient's condition.   Labs Review Labs Reviewed  COMPREHENSIVE METABOLIC PANEL - Abnormal; Notable for the following:    Potassium 3.4 (*)    Glucose, Bld 110 (*)    All other components within normal limits  I-STAT CHEM 8, ED - Abnormal; Notable for the following:    Potassium 3.4 (*)    Glucose, Bld 108 (*)    Hemoglobin 15.3 (*)    All other components within normal limits  ETHANOL  PROTIME-INR  APTT  CBC  DIFFERENTIAL  URINE RAPID DRUG SCREEN, HOSP PERFORMED  URINALYSIS, ROUTINE W REFLEX MICROSCOPIC (NOT AT [facility])  HEMOGLOBIN A1C  LIPID PANEL  I-STAT TROPOININ, ED  CBG MONITORING, ED    Imaging Review Ct Head Wo Contrast  02/26/2015  CLINICAL DATA:  Dysphagia, right arm tingling/numbness, previous CVA EXAM: CT HEAD WITHOUT CONTRAST TECHNIQUE: Contiguous axial images were obtained from the base of the skull through the vertex without intravenous contrast. COMPARISON:  MRI brain dated 06/02/2009 FINDINGS: No evidence of parenchymal hemorrhage or extra-axial fluid collection. No mass lesion, mass effect, or midline shift. No CT evidence of acute infarction.  Mild small vessel ischemic changes. Cerebral volume is within normal limits.  No ventriculomegaly. The visualized paranasal sinuses are essentially clear. The mastoid air cells are unopacified. No evidence of calvarial fracture. IMPRESSION: No evidence of acute intracranial abnormality. Mild small vessel ischemic changes. These results were called by telephone at the time of interpretation on 02/26/2015 at 5:28 pm to Dr. Jim Like , who verbally acknowledged these results. Electronically Signed   By: Julian Hy M.D.   On: 02/26/2015 17:27   I have personally reviewed and evaluated these images and lab results as part of my medical decision-making.   EKG Interpretation   Date/Time:  Friday February 26 2015 17:40:46 EDT Ventricular Rate:  77 PR Interval:  185 QRS Duration: 88 QT Interval:  387 QTC Calculation: 438 R Axis:   100 Text Interpretation:  Sinus rhythm Right axis  deviation Low voltage,  precordial leads Anteroseptal infarct, old Baseline wander in lead(s) II  III aVF No significant change was found Confirmed by Hartley Wyke  MD, Lennette Bihari  ([id number]) on 02/26/2015 5:57:57 PM      MDM   Final diagnoses:  Cerebrovascular accident (CVA) due to other mechanism [facility])    Patient will receive TPA for her expressive aphasia.  CT head negative.  Admitted to neurology service.  Patient be admitted to the intensive care unit.  We'll continue to observe the patient closely in the emergency department while she received TPA.  Please see indications per neurology consultation note.    Jola Schmidt, MD 02/26/15 [phone]

## 2015-02-26 NOTE — H&P (Addendum)
Stroke Consult    Chief Complaint: speech dif HPI: Brianna Simon is an 64 y.o. female hx of prior CVA with residual right sided deficits, HTN presenting with acute onset of expressive aphasia. 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 EMS called, noted she had expressive aphasia, they report it has gotten worse upon arrival to the ED. She reports talking to someone around 1550, had no speech deficits at that time.   CT head imaging reviewed, no acute process. Reviewed prior MRI from 2011, shows small left sided acute infarct. No signs of hemorrhage. Patient takes daily ASA, not on anticoagulation.   Risk/benefits of tPA discussed extensively with patient and spouse. Discussed risk of ICH vs risk of progression of stroke symptoms. Patient and spouse consented to tPA.   Date last known well: 02/26/2015 Time last known well: 1550 tPA Given: yes Modified Rankin: Rankin Score=1  Past Medical History  Diagnosis Date  . Hypertension   . Mitral valve prolapse     does not cause patient any problems  . Hx: UTI (urinary tract infection)   . Anxiety   . GERD (gastroesophageal reflux disease)   . Hyperlipidemia   . Stroke 2011    mild  . Seasonal allergies   . Arthritis     hips, hands, lower back - otc med prn  . Cancer     skin cancer left wrist and left chest  . SVD (spontaneous vaginal delivery)     x 1 - gave up for adoption    Past Surgical History  Procedure Laterality Date  . Colonoscopy    . Sinus surgery with instatrak    . Lipoma removed      right shoulder  . Wisdom tooth extraction    . Dilatation & curettage/hysteroscopy with trueclear N/A 10/24/2013    Procedure: DILATATION & CURETTAGE/HYSTEROSCOPY WITH TRUCLEAR;  Surgeon: Logan Bores, MD;  Location: [facility];  Service: Gynecology;  Laterality: N/A;  1 1/2hrs OR time    Family History  Problem Relation Age of Onset  .  Heart disease Mother     pacemaker   . Diverticulitis Other    Social History:  reports that she quit smoking about 11 years ago. Her smoking use included Cigarettes. She has a 60 pack-year smoking history. She has never used smokeless tobacco. She reports that she drinks alcohol. She reports that she does not use illicit drugs.  Allergies:  Allergies  Allergen Reactions  . Citalopram Hydrobromide     REACTION: blurred vision  . Codeine Phosphate     Liquid formulation only bothers her; tolerates percocet fine.  REACTION: nausea,vomiting  . Nitrofurantoin     REACTION: itching  . Nsaids Other (See Comments)    Elevated liver panel  . Sulfamethoxazole-Trimethoprim     REACTION: itching     (Not in a hospital admission)  ROS: Out of a complete 14 system review, the patient complains of only the following symptoms, and all other reviewed systems are negative. +speech deficits  Physical Examination: Filed Vitals:   02/26/15 1732  BP: 188/75  Pulse: 76  Temp: 97.9 F (36.6 C)  Resp: 18   Physical Exam  Constitutional: He appears well-developed and well-nourished.  Psych: Affect appropriate to situation Eyes: No scleral injection HENT: No OP obstrucion Head: Normocephalic.  Cardiovascular: Normal rate and regular rhythm.  Respiratory: Effort normal and breath sounds normal.  GI:  Soft. Bowel sounds are normal. No distension. There is no tenderness.  Skin: WDI   Neurologic Examination: Mental Status: Alert, unable to state name, location or date. Marked expressive aphasia noted. Mild dysarthria. Not able to repeat Cranial Nerves: II: optic discs not visualized, visual fields grossly normal, pupils equal, round, reactive to light and accommodation III,IV, VI: ptosis not present, extra-ocular motions intact bilaterally V,VII: smile symmetric, facial light touch sensation normal bilaterally VIII: hearing normal bilaterally IX,X: gag reflex present XI: trapezius  strength/neck flexion strength normal bilaterally XII: tongue strength normal  Motor: Right : Upper extremity    Left:     Upper extremity 5/5 deltoid       5/5 deltoid 5/5 biceps      5/5 biceps  5/5 triceps      5/5 triceps 5/5 hand grip      5/5 hand grip  Lower extremity     Lower extremity 5/5 hip flexor      5/5 hip flexor 5/5 quadricep      5/5 quadriceps  5/5 hamstrings     5/5 hamstrings 5/5 plantar flexion       5/5 plantar flexion 5/5 plantar extension     5/5 plantar extension Tone and bulk:normal tone throughout; no atrophy noted Sensory: Pinprick and light touch intact throughout, bilaterally Deep Tendon Reflexes: 2+ and symmetric throughout Plantars: Right: downgoing   Left: downgoing Cerebellar: normal finger-to-nose, and normal heel-to-shin test Gait: deferred  Laboratory Studies:   Basic Metabolic Panel:  Recent Labs Lab 02/26/15 1720  NA 143  K 3.4*  CL 105  GLUCOSE 108*  BUN 13  CREATININE 0.60    Liver Function Tests: No results for input(s): AST, ALT, ALKPHOS, BILITOT, PROT, ALBUMIN in the last 168 hours. No results for input(s): LIPASE, AMYLASE in the last 168 hours. No results for input(s): AMMONIA in the last 168 hours.  CBC:  Recent Labs Lab 02/26/15 1716 02/26/15 1720  WBC 6.1  --   NEUTROABS 3.4  --   HGB 14.3 15.3*  HCT 42.9 45.0  MCV 91.3  --   PLT 252  --     Cardiac Enzymes: No results for input(s): CKTOTAL, CKMB, CKMBINDEX, TROPONINI in the last 168 hours.  BNP: Invalid input(s): POCBNP  CBG: No results for input(s): GLUCAP in the last 168 hours.  Microbiology: No results found for this or any previous visit.  Coagulation Studies:  Recent Labs  02/26/15 1716  LABPROT 14.4  INR 1.10    Urinalysis: No results for input(s): COLORURINE, LABSPEC, PHURINE, GLUCOSEU, HGBUR, BILIRUBINUR, KETONESUR, PROTEINUR, UROBILINOGEN, NITRITE, LEUKOCYTESUR in the last 168 hours.  Invalid input(s): APPERANCEUR  Lipid Panel:      Component Value Date/Time   CHOL 162 10/27/2014 1144   TRIG 62 10/27/2014 1144   HDL 74 10/27/2014 1144   CHOLHDL 2.2 10/27/2014 1144   VLDL 12 10/27/2014 1144   LDLCALC 76 10/27/2014 1144    HgbA1C:  Lab Results  Component Value Date   HGBA1C  06/02/2009    5.4 (NOTE) The ADA recommends the following therapeutic goal for glycemic control related to Hgb A1c measurement: Goal of therapy: <6.5 Hgb A1c  Reference: American Diabetes Association: Clinical Practice Recommendations 2010, Diabetes Care, 2010, 33: (Suppl  1).    Urine Drug Screen:     Component Value Date/Time   LABOPIA NONE DETECTED 06/02/2009 1509   COCAINSCRNUR NONE DETECTED 06/02/2009 1509   LABBENZ NONE DETECTED 06/02/2009 1509   AMPHETMU NONE DETECTED 06/02/2009  [place] 06/02/2009 1509   LABBARB  06/02/2009 1509    NONE DETECTED        DRUG SCREEN FOR MEDICAL PURPOSES ONLY.  IF CONFIRMATION IS NEEDED FOR ANY PURPOSE, NOTIFY LAB WITHIN 5 DAYS.        LOWEST DETECTABLE LIMITS FOR URINE DRUG SCREEN Drug Class       Cutoff (ng/mL) Amphetamine      1000 Barbiturate      200 Benzodiazepine   948 Tricyclics       546 Opiates          300 Cocaine          300 THC              50    Alcohol Level: No results for input(s): ETH in the last 168 hours.  Other results: EKG: normal EKG, normal sinus rhythm.  Imaging: Ct Head Wo Contrast  02/26/2015  CLINICAL DATA:  Dysphagia, right arm tingling/numbness, previous CVA EXAM: CT HEAD WITHOUT CONTRAST TECHNIQUE: Contiguous axial images were obtained from the base of the skull through the vertex without intravenous contrast. COMPARISON:  MRI brain dated 06/02/2009 FINDINGS: No evidence of parenchymal hemorrhage or extra-axial fluid collection. No mass lesion, mass effect, or midline shift. No CT evidence of acute infarction. Mild small vessel ischemic changes. Cerebral volume is within normal limits.  No ventriculomegaly. The visualized paranasal  sinuses are essentially clear. The mastoid air cells are unopacified. No evidence of calvarial fracture. IMPRESSION: No evidence of acute intracranial abnormality. Mild small vessel ischemic changes. These results were called by telephone at the time of interpretation on 02/26/2015 at 5:28 pm to Dr. Jim Like , who verbally acknowledged these results. Electronically Signed   By: Julian Hy M.D.   On: 02/26/2015 17:27    Assessment: 64 y.o. female hx of prior CVA, HTN presenting with acute onset of expressive aphasia. Brought in as code stroke with initial NIHSS of 5. Extensively discussed risk/benefits of tPA with patient and spouse. They agreed to go ahead with tPA. Not an IR candidate. Will admit for further stroke workup. Possible embolic etiology.   Plan: 1. HgbA1c, fasting lipid panel 2. MRI, MRA  of the brain without contrast 3. PT consult, OT consult, Speech consult 4. Echocardiogram 5. Carotid dopplers 6. Prophylactic therapy-hold antiplatelet 24hrs post tPA 7. Risk factor modification 8. Telemetry monitoring 9. Frequent neuro checks 10. NPO until RN stroke swallow screen    This patient is critically ill and at significant risk of neurological worsening, death and care requires constant monitoring of vital signs, hemodynamics,respiratory and cardiac monitoring,review of multiple databases, neurological assessment, discussion with family, other specialists and medical decision making of high complexity. I spent 45 minutes of neurocritical care time in the care of this patient.   Jim Like, DO [facility] [phone]  If 7pm- 7am, please page neurology on call as listed in [place]. 02/26/2015, 5:44 PM

## 2015-02-27 ENCOUNTER — Inpatient Hospital Stay (HOSPITAL_COMMUNITY): Payer: 59

## 2015-02-27 ENCOUNTER — Encounter (HOSPITAL_COMMUNITY): Payer: Self-pay | Admitting: *Deleted

## 2015-02-27 DIAGNOSIS — R471 Dysarthria and anarthria: Secondary | ICD-10-CM

## 2015-02-27 DIAGNOSIS — E785 Hyperlipidemia, unspecified: Secondary | ICD-10-CM

## 2015-02-27 DIAGNOSIS — I6789 Other cerebrovascular disease: Secondary | ICD-10-CM

## 2015-02-27 DIAGNOSIS — Z8673 Personal history of transient ischemic attack (TIA), and cerebral infarction without residual deficits: Secondary | ICD-10-CM

## 2015-02-27 DIAGNOSIS — I10 Essential (primary) hypertension: Secondary | ICD-10-CM

## 2015-02-27 LAB — LIPID PANEL
CHOLESTEROL: 156 mg/dL (ref 0–200)
HDL: 54 mg/dL (ref >40)
LDL CALC: 86 mg/dL (ref 0–99)
Total CHOL/HDL Ratio: 2.9 RATIO
Triglycerides: 81 mg/dL (ref <150)
VLDL: 16 mg/dL (ref 0–40)

## 2015-02-27 LAB — GLUCOSE, CAPILLARY
GLUCOSE-CAPILLARY: 105 mg/dL — AB (ref 65–99)
Glucose-Capillary: 90 mg/dL (ref 65–99)

## 2015-02-27 MED ORDER — ATORVASTATIN CALCIUM 40 MG PO TABS
40.0000 mg | ORAL_TABLET | Freq: Every day | ORAL | Status: DC
  Administered 2015-02-27 – 2015-03-01 (×3): 40 mg via ORAL
  Filled 2015-02-27 (×3): qty 1

## 2015-02-27 MED ORDER — ASPIRIN 325 MG PO TABS
325.0000 mg | ORAL_TABLET | Freq: Every day | ORAL | Status: DC
  Administered 2015-02-27 – 2015-03-02 (×4): 325 mg via ORAL
  Filled 2015-02-27 (×5): qty 1

## 2015-02-27 MED ORDER — ENOXAPARIN SODIUM 40 MG/0.4ML ~~LOC~~ SOLN
40.0000 mg | SUBCUTANEOUS | Status: DC
  Administered 2015-02-27 – 2015-02-28 (×2): 40 mg via SUBCUTANEOUS
  Filled 2015-02-27 (×2): qty 0.4

## 2015-02-27 MED ORDER — PANTOPRAZOLE SODIUM 40 MG PO TBEC
40.0000 mg | DELAYED_RELEASE_TABLET | Freq: Every day | ORAL | Status: DC
  Administered 2015-02-27 – 2015-03-02 (×4): 40 mg via ORAL
  Filled 2015-02-27 (×4): qty 1

## 2015-02-27 MED ORDER — SODIUM CHLORIDE 0.9 % IV SOLN: INTRAVENOUS | Status: DC

## 2015-02-27 NOTE — Evaluation (Signed)
Clinical/Bedside Swallow Evaluation Patient Details  Name: Brianna Simon MRN: [id number] Date of Birth: 1950-07-26  Today's Date: 02/27/2015 Time: SLP Start Time (ACUTE ONLY): 5809 SLP Stop Time (ACUTE ONLY): 0940 SLP Time Calculation (min) (ACUTE ONLY): 13 min  Past Medical History:  Past Medical History  Diagnosis Date  . Hypertension   . Mitral valve prolapse     does not cause patient any problems  . Hx: UTI (urinary tract infection)   . Anxiety   . GERD (gastroesophageal reflux disease)   . Hyperlipidemia   . Stroke 2011    mild  . Seasonal allergies   . Arthritis     hips, hands, lower back - otc med prn  . Cancer     skin cancer left wrist and left chest  . SVD (spontaneous vaginal delivery)     x 1 - gave up for adoption   Past Surgical History:  Past Surgical History  Procedure Laterality Date  . Colonoscopy    . Sinus surgery with instatrak    . Lipoma removed      right shoulder  . Wisdom tooth extraction    . Dilatation & curettage/hysteroscopy with trueclear N/A 10/24/2013    Procedure: DILATATION & CURETTAGE/HYSTEROSCOPY WITH TRUCLEAR;  Surgeon: Logan Bores, MD;  Location: [facility];  Service: Gynecology;  Laterality: N/A;  1 1/2hrs OR time   HPI:  64 y.o. female hx of prior CVA, HTN presenting with acute onset of expressive aphasia.  TPa administered.  CT head negative; MRI pending.     Assessment / Plan / Recommendation Clinical Impression  Pt presents with normal oropharyngeal swallow with adequate mastication, brisk swallow response, no s/s of aspiration.  Recommend resuming a regular diet, thin liquids.  No SLP f/u for swallow; will follow for dysarthria.      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 female hx of prior CVA, HTN presenting with acute onset of expressive aphasia.  TPa administered.  CT head negative; MRI pending.   Type of Study: Bedside swallow evaluation Previous Swallow Assessment: no Diet Prior to this Study: NPO Respiratory Status: Room air History of Recent Intubation: No Behavior/Cognition: Alert;Cooperative;Pleasant mood Oral Cavity - Dentition: Adequate natural dentition/normal for age Self-Feeding Abilities: Able to feed self Patient Positioning: Upright in bed Baseline Vocal Quality: Normal Volitional Cough: Strong Volitional Swallow: Able to elicit    Oral/Motor/Sensory Function Overall Oral Motor/Sensory Function: Impaired (asymmetry right VII)   Ice Chips Ice chips: Within functional limits   Thin Liquid Thin Liquid: Within functional limits Presentation: Cup;Straw    Nectar Thick Nectar Thick Liquid: Not tested   Honey Thick Honey Thick Liquid: Not tested   Puree Puree: Within functional limits   Solid   GO    Solid: Within functional limits       Brianna Simon 02/27/2015,10:31 AM

## 2015-02-27 NOTE — Progress Notes (Signed)
Dr. Yevonne Aline made aware of CT results

## 2015-02-27 NOTE — Progress Notes (Signed)
  Echocardiogram 2D Echocardiogram has been performed.  Brianna Simon 02/27/2015, 11:48 AM

## 2015-02-27 NOTE — Evaluation (Signed)
Speech Language Pathology Evaluation Patient Details Name: Brianna Simon MRN: [id number] DOB: 08-11-1950 Today's Date: 02/27/2015 Time: 5188-4166 SLP Time Calculation (min) (ACUTE ONLY): 33 min  Problem List:  Patient Active Problem List   Diagnosis Date Noted  . CVA (cerebral infarction) 02/26/2015  . Former smoker 12/09/2014  . Allergic rhinitis 10/27/2014  . Anxiety state 10/27/2014  . Hyperlipidemia 02/22/2010  . History of CVA (cerebrovascular accident) 02/22/2010  . Esophageal reflux 12/31/2006  . Insomnia 12/31/2006  . MVP (mitral valve prolapse) 12/10/2006  . Essential hypertension 12/07/2006   Past Medical History:  Past Medical History  Diagnosis Date  . Hypertension   . Mitral valve prolapse     does not cause patient any problems  . Hx: UTI (urinary tract infection)   . Anxiety   . GERD (gastroesophageal reflux disease)   . Hyperlipidemia   . Stroke 2011    mild  . Seasonal allergies   . Arthritis     hips, hands, lower back - otc med prn  . Cancer     skin cancer left wrist and left chest  . SVD (spontaneous vaginal delivery)     x 1 - gave up for adoption   Past Surgical History:  Past Surgical History  Procedure Laterality Date  . Colonoscopy    . Sinus surgery with instatrak    . Lipoma removed      right shoulder  . Wisdom tooth extraction    . Dilatation & curettage/hysteroscopy with trueclear N/A 10/24/2013    Procedure: DILATATION & CURETTAGE/HYSTEROSCOPY WITH TRUCLEAR;  Surgeon: Logan Bores, MD;  Location: [facility];  Service: Gynecology;  Laterality: N/A;  1 1/2hrs OR time   HPI:  64 y.o. female hx of prior CVA, HTN presenting with acute onset of expressive aphasia.  TPa administered.  CT head negative; MRI pending.     Assessment / Plan / Recommendation Clinical Impression  Pt presents with normal language function - aphasia has resolved.  She is able to follow multistep commands; answer questions after listening to paragraphs.   Expressive language is intact.  Pt does present with a moderate-severe dysarthria c/b impaired diadokokinetic rates/impaired coordination, distorted consonant production; severe hypernasal resonance.   Recommend SLP treatment to address communication needs.  D/W pt, husband, and Dr. Erlinda Hong.      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)   Cognition  Overall Cognitive Status: Within Functional Limits for tasks assessed Arousal/Alertness: Awake/alert Orientation Level: Oriented X4    Comprehension  Auditory Comprehension Overall Auditory Comprehension: Appears within functional limits for tasks assessed Yes/No Questions: Within Functional Limits Commands: Within Functional Limits Conversation: Complex Visual Recognition/Discrimination Discrimination: Within Function Limits Reading Comprehension Reading Status: Within funtional limits    Expression Expression Primary Mode of Expression: Verbal Verbal Expression Overall Verbal Expression: Appears within functional limits for tasks assessed Level of Generative/Spontaneous Verbalization: Sentence Repetition: Impaired Level of Impairment: Word level Naming: No impairment Pragmatics: No impairment Written Expression Written Expression: Within Functional Limits   Oral / Motor Oral Motor/Sensory Function Overall Oral Motor/Sensory Function: Impaired (asymmetry right VII) Motor Speech Overall Motor Speech: Impaired Respiration: Within functional limits Phonation: Normal Resonance: Hypernasality Articulation: Impaired Level of Impairment: Word   GO  Cary Lothrop L. Tivis Ringer, [place] CCC/SLP Pager [phone]   Juan Quam Laurice 02/27/2015, 10:24 AM

## 2015-02-27 NOTE — Progress Notes (Signed)
STROKE TEAM PROGRESS NOTE   HISTORY Brianna Simon is an 64 y.o. female hx of prior CVA with residual right sided deficits, HTN presenting with acute onset of expressive aphasia. 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 EMS called, noted she had expressive aphasia, they report it has gotten worse upon arrival to the ED. She reports talking to someone around 1550, had no speech deficits at that time.   CT head imaging reviewed, no acute process. Reviewed prior MRI from 2011, shows small left sided acute infarct. No signs of hemorrhage. Patient takes daily ASA, not on anticoagulation.   Risk/benefits of tPA discussed extensively with patient and spouse. Discussed risk of ICH vs risk of progression of stroke symptoms. Patient and spouse consented to tPA.   Date last known well: 02/26/2015 Time last known well: 1550 tPA Given: yes Modified Rankin: Rankin Score=1   SUBJECTIVE (INTERVAL HISTORY) Her husband is at the bedside.  Overall she feels her condition is gradually improving. However, she still has severe dysarthria. No much aphasia but more severe dysarthria. She had small left parietal embolic infarct in 09/1698, had extensive test including TEE (as per pt), but no cause found. However, she was enrolled to research trial for stroke prevention. She followed with Dr. Leonie Man as outpt, and was doing well on ASA. Until yesterday, she had expressive aphasia with no word out with right facial droop and right hand numbness and mild weaknes. Got tPA, and today speech much better but severe dysarthria. Still has mild right facial and right hand clumsiness.   Of note, pt complaining of intermittent racing heart, heart palpitation, average once a month. Starts suddenly and goes off suddenly.    OBJECTIVE Temp:  [97.9 F (36.6 C)-98.8 F (37.1 C)] 98.3 F (36.8 C) (10/15 1239) Pulse Rate:  [66-87] 80 (10/15  1200) Cardiac Rhythm:  [-] Normal sinus rhythm (10/15 0800) Resp:  [9-21] 17 (10/15 1200) BP: (109-188)/(49-138) 158/73 mmHg (10/15 1200) SpO2:  [92 %-97 %] 95 % (10/15 1200) Weight:  [66.3 kg (146 lb 2.6 oz)-67.1 kg (147 lb 14.9 oz)] 66.3 kg (146 lb 2.6 oz) (10/15 0400)  CBC:   Recent Labs Lab 02/26/15 1716 02/26/15 1720  WBC 6.1  --   NEUTROABS 3.4  --   HGB 14.3 15.3*  HCT 42.9 45.0  MCV 91.3  --   PLT 252  --     Basic Metabolic Panel:   Recent Labs Lab 02/26/15 1716 02/26/15 1720  NA 137 143  K 3.4* 3.4*  CL 106 105  CO2 24  --   GLUCOSE 110* 108*  BUN 11 13  CREATININE 0.67 0.60  CALCIUM 8.9  --     Lipid Panel:     Component Value Date/Time   CHOL 156 02/27/2015 0416   TRIG 81 02/27/2015 0416   HDL 54 02/27/2015 0416   CHOLHDL 2.9 02/27/2015 0416   VLDL 16 02/27/2015 0416   LDLCALC 86 02/27/2015 0416   HgbA1c:  Lab Results  Component Value Date   HGBA1C  06/02/2009    5.4 (NOTE) The ADA recommends the following therapeutic goal for glycemic control related to Hgb A1c measurement: Goal of therapy: <6.5 Hgb A1c  Reference: American Diabetes Association: Clinical Practice Recommendations 2010, Diabetes Care, 2010, 33: (Suppl  1).   Urine Drug Screen:     Component Value Date/Time   LABOPIA NONE DETECTED 02/26/2015 1955  COCAINSCRNUR NONE DETECTED 02/26/2015 1955   LABBENZ NONE DETECTED 02/26/2015 1955   AMPHETMU NONE DETECTED 02/26/2015 1955   THCU NONE DETECTED 02/26/2015 1955   LABBARB NONE DETECTED 02/26/2015 1955      IMAGING  Ct Head Wo Contrast 02/26/2015   No evidence of acute intracranial abnormality. Mild small vessel ischemic changes.   MRI and MRA pending  CUS pending  2D echo pending    PHYSICAL EXAM  Temp:  [97.9 F (36.6 C)-98.8 F (37.1 C)] 98.3 F (36.8 C) (10/15 1239) Pulse Rate:  [66-87] 80 (10/15 1200) Resp:  [9-21] 12 (10/15 1400) BP: (109-188)/(49-138) 128/61 mmHg (10/15 1400) SpO2:  [92 %-97 %] 95 %  (10/15 1200) Weight:  [146 lb 2.6 oz (66.3 kg)-147 lb 14.9 oz (67.1 kg)] 146 lb 2.6 oz (66.3 kg) (10/15 0400)  General - Well nourished, well developed, in no apparent distress.  Ophthalmologic - Sharp disc margins OU.   Cardiovascular - Regular rate and rhythm.  Mental Status -  Level of arousal and orientation to time, place, and person were intact. Language including expression, naming, repetition, comprehension was assessed and found intact, however, severe dysarthria with hyponasal sound. Fund of Knowledge was assessed and was intact.  Cranial Nerves II - XII - II - Visual field intact OU. III, IV, VI - Extraocular movements intact. V - Facial sensation intact bilaterally. VII - mild right facial droop. VIII - Hearing & vestibular intact bilaterally. X - Palate elevates symmetrically. XI - Chin turning & shoulder shrug intact bilaterally. XII - Tongue protrusion intact.  Motor Strength - The patient's strength was normal in all extremities except right hand mild dexterity difficulty (with writing) and pronator drift was absent.  Bulk was normal and fasciculations were absent.   Motor Tone - Muscle tone was assessed at the neck and appendages and was normal.  Reflexes - The patient's reflexes were 1+ in all extremities and she had no pathological reflexes.  Sensory - Light touch, temperature/pinprick were assessed and were symmetrical.    Coordination - The patient had normal movements in the hands and feet with no ataxia or dysmetria.  Tremor was absent.  Gait and Station - not tested due to safety concerns.    ASSESSMENT/PLAN Brianna Simon is a 64 y.o. female with history of previous embolic stroke in 4765, hypertension, mitral valve prolapse, anxiety, hyperlipidemia, presenting with expressive aphasia. She received TPA 60 mg at 1745 on 02/26/2015. Symptoms improving but with severe dysarthria.  Stroke:  Possible left MCA cortical infarct, possibly embolic,  highly suspicious for afib due to hx of sudden on and off heart racing and palpitation.   Resultant severe dysarthria.  MRI  pending  MRA  pending  Carotid Doppler pending  2D Echo pending  LDL 86  HgbA1c pending  Will check her TEE result in 2011, if negative, she needs loop recorder placement.   VTE prophylaxis - SCDs  Diet regular Room service appropriate?: Yes; Fluid consistency:: Thin  aspirin 325 mg orally every day prior to admission, now on no antithrombotic secondary to TPA.  Ongoing aggressive stroke risk factor management  Therapy recommendations: Pending  Disposition:  Pending  Hx of embolic stroke  08/6501 with left parietal small infarct  Had extensive stroke work up including TEE  Not sure about cardiac monitoring at that time  Was enrolled in research trial  On ASA  Follows with Dr. Leonie Man as outpt in [place]. Last follow up one year ago.   Severe  dysarthria  Passed swallow  Speech following  Hypertension  Stable  Permissive hypertension (OK if < 180/105) but gradually normalize in 5-7 days  Hyperlipidemia  Home meds: Zocor 20 mg daily   LDL 86, goal < 70  Change to Lipitor 40 mg daily  Continue statin at discharge   Other Stroke Risk Factors  Advanced age  Cigarette smoker, quit smoking 11 years ago.  ETOH use  Other Active Problems  Mild hypokalemia  Hospital day # 1  This patient is critically ill due to embolic stroke s/p tPA and at significant risk of neurological worsening, death form hemorrhagic transformation, and bleeding. This patient's care requires constant monitoring of vital signs, hemodynamics, respiratory and cardiac monitoring, review of multiple databases, neurological assessment, discussion with family, other specialists and medical decision making of high complexity. I spent 40 minutes of neurocritical care time in the care of this patient.   Rosalin Hawking, MD PhD Stroke Neurology 02/27/2015 2:59  PM      To contact Stroke Continuity provider, please refer to [email or web address] After hours, contact General Neurology

## 2015-02-28 ENCOUNTER — Inpatient Hospital Stay (HOSPITAL_COMMUNITY): Payer: 59

## 2015-02-28 DIAGNOSIS — Z8669 Personal history of other diseases of the nervous system and sense organs: Secondary | ICD-10-CM

## 2015-02-28 DIAGNOSIS — R002 Palpitations: Secondary | ICD-10-CM

## 2015-02-28 DIAGNOSIS — I63412 Cerebral infarction due to embolism of left middle cerebral artery: Secondary | ICD-10-CM

## 2015-02-28 NOTE — Progress Notes (Signed)
Utilization Review Completed.Brianna Simon T10/16/2016  

## 2015-02-28 NOTE — Progress Notes (Signed)
Pt and husband very distressed over spreading pinkness and pain on pressure from infiltrated site on forearm (originally had tPA and pressure dressing) and requested for Neurology to assess arm. Neurology came and visited patient to access arm. If area worsen in color or patient has fever, please notify MD. RN provided education and stressed importance of applying warm compress and elevating arm to patient.

## 2015-02-28 NOTE — Evaluation (Signed)
Physical Therapy Evaluation Patient Details Name: Brianna Simon MRN: [id number] DOB: [birth year] Today's Date: 02/28/2015   History of Present Illness  64 yo female with onset of L frontal parietal infarct with chronic L basal ganglia infarct was referred to PT with no new testing information available.  Clinical Impression  Pt essentially is for PT's purpose at baseline but is definitely needing OT consult and ST consult.  Her gait even at stairs is not a fall risk, as noted previously by nursing as well.  Her plan is to dc for now and assured pt if she began to have issues that PT would return.    Follow Up Recommendations No PT follow up    Equipment Recommendations  None recommended by PT    Recommendations for Other Services       Precautions / Restrictions Precautions Precautions: None Restrictions Weight Bearing Restrictions: No      Mobility  Bed Mobility Overal bed mobility: Modified Independent                Transfers Overall transfer level: Modified independent Equipment used: None             General transfer comment: used hands to stand safely bedside  Ambulation/Gait Ambulation/Gait assistance: Modified independent (Device/Increase time) Ambulation Distance (Feet): 250 Feet Assistive device: None Gait Pattern/deviations: Step-through pattern;Narrow base of support Gait velocity: normal Gait velocity interpretation: at or above normal speed for age/gender General Gait Details: Pt is independent with gait, no LOB or struggle to walk  Stairs Stairs: Yes Stairs assistance: Supervision Stair Management: No rails;Forwards Number of Stairs: 3 General stair comments: able to climb with no PT or hand rail assistance  Wheelchair Mobility    Modified Rankin (Stroke Patients Only) Modified Rankin (Stroke Patients Only) Pre-Morbid Rankin Score: No symptoms Modified Rankin: No significant disability     Balance Overall balance assessment:  Modified Independent                                           Pertinent Vitals/Pain Pain Assessment: No/denies pain    Home Living Family/patient expects to be discharged to:: Private residence Living Arrangements: Spouse/significant other Available Help at Discharge: Family;Available PRN/intermittently Type of Home: House Home Access: Stairs to enter Entrance Stairs-Rails: None Entrance Stairs-Number of Steps: 2 Home Layout: One level Home Equipment: None      Prior Function Level of Independence: Independent               Hand Dominance   Dominant Hand: Right    Extremity/Trunk Assessment   Upper Extremity Assessment: RUE deficits/detail (numbness in R hand and dropping objects per pt)           Lower Extremity Assessment: Overall WFL for tasks assessed      Cervical / Trunk Assessment: Normal  Communication   Communication: Expressive difficulties (speech is slurred)  Cognition Arousal/Alertness: Awake/alert Behavior During Therapy: WFL for tasks assessed/performed Overall Cognitive Status: Within Functional Limits for tasks assessed                      General Comments General comments (skin integrity, edema, etc.): Pt is able to walk and climb steps with no help but does have significant UE and speech deficits and asked her to identify this to MD or staff if something changes from today.    Exercises  Assessment/Plan    PT Assessment Patent does not need any further PT services  PT Diagnosis Other (comment) (post stroke assessment of gait)   PT Problem List    PT Treatment Interventions     PT Goals (Current goals can be found in the Care Plan section) Acute Rehab PT Goals Patient Stated Goal: to get her R hand function back to normal as well as speech PT Goal Formulation: All assessment and education complete, DC therapy    Frequency     Barriers to discharge        Co-evaluation                End of Session   Activity Tolerance: Patient tolerated treatment well Patient left: in bed;with call bell/phone within reach (sat on bed) Nurse Communication: Mobility status         Time: 1450-1502 PT Time Calculation (min) (ACUTE ONLY): 12 min   Charges:   PT Evaluation $Initial PT Evaluation Tier I: 1 Procedure     PT G CodesRamond Dial 03-03-15, 4:08 PM   Mee Hives, PT MS Acute Rehab Dept. Number: [facility] [id number] and [place] [phone]

## 2015-02-28 NOTE — Progress Notes (Signed)
PT Cancellation Note  Patient Details Name: Brianna Simon MRN: [id number] DOB: May 20, 1950   Cancelled Treatment:    Reason Eval/Treat Not Completed: Patient at procedure or test/unavailable.  Will try as time permits later   Ramond Dial 02/28/2015, 11:00 AM   Mee Hives, PT MS Acute Rehab Dept. Number: [facility] [id number] and [place] [phone]

## 2015-02-28 NOTE — Progress Notes (Signed)
STROKE TEAM PROGRESS NOTE   SUBJECTIVE (INTERVAL HISTORY) Husband at bedside. Patient's dysarthria has much improved, not baseline yet. No acute event. Has left arm superficial vein irritation due to previous IV. Given warm compression.   OBJECTIVE Temp:  [97.5 F (36.4 C)-98.5 F (36.9 C)] 97.5 F (36.4 C) (10/16 0726) Pulse Rate:  [74-91] 83 (10/16 0726) Cardiac Rhythm:  [-] Normal sinus rhythm (10/16 0800) Resp:  [12-26] 16 (10/16 0726) BP: (114-158)/(52-86) 118/86 mmHg (10/16 0726) SpO2:  [93 %-100 %] 98 % (10/16 0726)  CBC:   Recent Labs Lab 02/26/15 1716 02/26/15 1720  WBC 6.1  --   NEUTROABS 3.4  --   HGB 14.3 15.3*  HCT 42.9 45.0  MCV 91.3  --   PLT 252  --     Basic Metabolic Panel:   Recent Labs Lab 02/26/15 1716 02/26/15 1720  NA 137 143  K 3.4* 3.4*  CL 106 105  CO2 24  --   GLUCOSE 110* 108*  BUN 11 13  CREATININE 0.67 0.60  CALCIUM 8.9  --     Lipid Panel:     Component Value Date/Time   CHOL 156 02/27/2015 0416   TRIG 81 02/27/2015 0416   HDL 54 02/27/2015 0416   CHOLHDL 2.9 02/27/2015 0416   VLDL 16 02/27/2015 0416   LDLCALC 86 02/27/2015 0416   HgbA1c:  Lab Results  Component Value Date   HGBA1C  06/02/2009    5.4 (NOTE) The ADA recommends the following therapeutic goal for glycemic control related to Hgb A1c measurement: Goal of therapy: <6.5 Hgb A1c  Reference: American Diabetes Association: Clinical Practice Recommendations 2010, Diabetes Care, 2010, 33: (Suppl  1).   Urine Drug Screen:     Component Value Date/Time   LABOPIA NONE DETECTED 02/26/2015 1955   COCAINSCRNUR NONE DETECTED 02/26/2015 1955   LABBENZ NONE DETECTED 02/26/2015 1955   AMPHETMU NONE DETECTED 02/26/2015 1955   THCU NONE DETECTED 02/26/2015 1955   LABBARB NONE DETECTED 02/26/2015 1955      IMAGING I have personally reviewed the radiological images below and agree with the radiology interpretations.  Ct Head Wo Contrast 02/27/2015 1. Findings  suggestive of subacute ischemia in the left MCA distribution, as above. This is new compared to prior study 02/26/2015. 2. Mild cerebral atrophy and mild chronic microvascular ischemic changes in the cerebral white matter redemonstrated.   02/26/2015   No evidence of acute intracranial abnormality. Mild small vessel ischemic changes.   Mr Brain Wo Contrast 02/27/2015  Acute infarct in the left precentral cortex. Atrophy and chronic microvascular ischemia.  Mr Jodene Nam Head/brain Wo Cm 02/27/2015   Negative MRA head.  Carotid Doppler  There is 1-39% bilateral ICA stenosis. Vertebral artery flow is antegrade.    2D echo Left ventricle: The cavity size was normal. Wall thickness wasincreased in a pattern of mild LVH. Systolic function was normal.The estimated ejection fraction was in the range of 55% to 60%.Wall motion was normal; there were no regional wall motionabnormalities. Doppler parameters are consistent with abnormalleft ventricular relaxation (grade 1 diastolic dysfunction). - Mitral valve: Calcified annulus. Mildly thickened leaflets . There was mild regurgitation. Impressions:   No cardiac source of emboli was indentified.    PHYSICAL EXAM  Temp:  [97.5 F (36.4 C)-98.5 F (36.9 C)] 97.5 F (36.4 C) (10/16 0726) Pulse Rate:  [74-91] 83 (10/16 0726) Resp:  [12-26] 16 (10/16 0726) BP: (114-158)/(52-86) 118/86 mmHg (10/16 0726) SpO2:  [93 %-100 %] 98 % (10/16 0726)  General - Well nourished, well developed, in no apparent distress.  Ophthalmologic - Sharp disc margins OU.   Cardiovascular - Regular rate and rhythm.  Mental Status -  Level of arousal and orientation to time, place, and person were intact. Language including expression, naming, repetition, comprehension was assessed and found intact, however, dysarthria much improved from yesterday. Fund of Knowledge was assessed and was intact.  Cranial Nerves II - XII - II - Visual field intact OU. III, IV, VI -  Extraocular movements intact. V - Facial sensation intact bilaterally. VII - mild right facial droop. VIII - Hearing & vestibular intact bilaterally. X - Palate elevates symmetrically. XI - Chin turning & shoulder shrug intact bilaterally. XII - Tongue protrusion intact.  Motor Strength - The patient's strength was normal in all extremities except right hand mild dexterity difficulty (with writing) and pronator drift was absent.  Bulk was normal and fasciculations were absent.   Motor Tone - Muscle tone was assessed at the neck and appendages and was normal.  Reflexes - The patient's reflexes were 1+ in all extremities and she had no pathological reflexes.  Sensory - Light touch, temperature/pinprick were assessed and were symmetrical.    Coordination - The patient had normal movements in the hands and feet with no ataxia or dysmetria.  Tremor was absent.  Gait and Station - not tested due to safety concerns.    ASSESSMENT/PLAN Ms. MARQUESA RATH is a 64 y.o. female with history of previous embolic stroke in 4315, hypertension, mitral valve prolapse, anxiety, hyperlipidemia, presenting with expressive aphasia. She received TPA 60 mg at 1745 on 02/26/2015. Symptoms improving but with severe dysarthria.  Stroke:  Left precentral cortical infarct, felt to be  embolic, highly suspicious for afib due to hx of sudden on and off heart racing and palpitation.   Resultant dysarthria.  MRI  L precentral cortex infarct  MRA  Unremarkable  Carotid Doppler No significant stenosis   2D Echo No source of embolus   TEE 2011 no PFO. No source of embolus  loop recorder placement consideration for atrial fibrillation source  LDL 86  HgbA1c pending  Hypercoagulable work up pending  VTE prophylaxis - lovenox  Diet regular Room service appropriate?: Yes; Fluid consistency:: Thin  aspirin 325 mg orally every day prior to admission, now on aspirin 325 mg orally every day .  Ongoing  aggressive stroke risk factor management  Therapy recommendations: Pending  Disposition:  Pending  Hx of embolic stroke  08/84 with left parietal small infarct  Had extensive stroke work up including TEE  Not sure about cardiac monitoring at that time  Was enrolled in research trial  On ASA  Follows with Dr. Leonie Man as outpt in [place]. Last follow up one year ago.   Severe dysarthria  Passed swallow  Speech following  Much improved  Hypertension  Stable  Permissive hypertension (OK if < 180/105) but gradually normalize in 5-7 days  Hyperlipidemia  Home meds: Zocor 20 mg daily   LDL 86, goal < 70  Change to Lipitor 40 mg daily  Continue statin at discharge   Other Stroke Risk Factors  Advanced age  Cigarette smoker, quit smoking 11 years ago.  ETOH use  Other Active Problems  Mild hypokalemia  Continue to follow up with Dr. Leonie Man as outpt.  Hospital day # 2  Rosalin Hawking, MD PhD Stroke Neurology 02/28/2015 10:40 AM      To contact Stroke Continuity provider, please refer to [email or web address] After  hours, contact General Neurology

## 2015-02-28 NOTE — Progress Notes (Signed)
VASCULAR LAB PRELIMINARY  PRELIMINARY  PRELIMINARY  PRELIMINARY  Carotid duplex completed.    Preliminary report:  1-39% ICA plaquing.  Vertebral artery flow is antegrade.   Vergene Marland, RVT 02/28/2015, 10:15 AM

## 2015-02-28 NOTE — Progress Notes (Signed)
Called by nursing staff to evaluate patient's left forearm - IV site from TPA. - reportedly TPA had infiltrated. Very mild ecchymosis oval shape - roughly 3 x 6 Cm. Already outlined in ink. C/w resolving hematoma. Soft except slight induration of underlying veins. Radial, ulnar, and brachial pulses intact. Very mild tenderness over the area. Pt declined a warm compress. Pinpoint darkened central area which was the site of the actual needle stick. Very slight pinkness to surrounding tissue. No real erythema. No radiating red streaks. Pt is afebrile. WBCs 6.1 on 02/26/2015 with normal differential. Check labs in AM. Discussed with nurse. Instructed her to call MD if pt developed a fever, increased erythema, or tenderness. May use warm or cool compresses as patient desires. Will continue to monitor on rounds. Discussed with Dr Erlinda Hong.  Mikey Bussing PA-C [facility] Pager [phone] 02/28/2015, 4:04 PM

## 2015-03-01 ENCOUNTER — Encounter (HOSPITAL_COMMUNITY): Payer: Self-pay | Admitting: Physician Assistant

## 2015-03-01 DIAGNOSIS — I639 Cerebral infarction, unspecified: Secondary | ICD-10-CM | POA: Diagnosis present

## 2015-03-01 LAB — HEMOGLOBIN A1C
Hgb A1c MFr Bld: 5.5 % (ref 4.8–5.6)
Mean Plasma Glucose: 111 mg/dL

## 2015-03-01 LAB — CBC WITH DIFFERENTIAL/PLATELET
BASOS ABS: 0 10*3/uL (ref 0.0–0.1)
BASOS PCT: 0 %
EOS ABS: 0.2 10*3/uL (ref 0.0–0.7)
Eosinophils Relative: 4 %
HEMATOCRIT: 42.7 % (ref 36.0–46.0)
HEMOGLOBIN: 14 g/dL (ref 12.0–15.0)
Lymphocytes Relative: 32 %
Lymphs Abs: 1.9 10*3/uL (ref 0.7–4.0)
MCH: 30.1 pg (ref 26.0–34.0)
MCHC: 32.8 g/dL (ref 30.0–36.0)
MCV: 91.8 fL (ref 78.0–100.0)
MONOS PCT: 9 %
Monocytes Absolute: 0.5 10*3/uL (ref 0.1–1.0)
NEUTROS ABS: 3.3 10*3/uL (ref 1.7–7.7)
NEUTROS PCT: 55 %
Platelets: 252 10*3/uL (ref 150–400)
RBC: 4.65 MIL/uL (ref 3.87–5.11)
RDW: 12.9 % (ref 11.5–15.5)
WBC: 5.9 10*3/uL (ref 4.0–10.5)

## 2015-03-01 LAB — BETA-2-GLYCOPROTEIN I ABS, IGG/M/A

## 2015-03-01 LAB — BASIC METABOLIC PANEL
ANION GAP: 9 (ref 5–15)
BUN: 12 mg/dL (ref 6–20)
CALCIUM: 8.7 mg/dL — AB (ref 8.9–10.3)
CO2: 24 mmol/L (ref 22–32)
Chloride: 104 mmol/L (ref 101–111)
Creatinine, Ser: 0.78 mg/dL (ref 0.44–1.00)
GFR calc non Af Amer: >60 mL/min (ref >60)
Glucose, Bld: 95 mg/dL (ref 65–99)
Potassium: 3.3 mmol/L — ABNORMAL LOW (ref 3.5–5.1)
SODIUM: 137 mmol/L (ref 135–145)

## 2015-03-01 MED ORDER — ADULT MULTIVITAMIN W/MINERALS CH
1.0000 | ORAL_TABLET | Freq: Every day | ORAL | Status: DC
  Administered 2015-03-01 – 2015-03-02 (×2): 1 via ORAL
  Filled 2015-03-01 (×2): qty 1

## 2015-03-01 MED ORDER — SERTRALINE HCL 100 MG PO TABS
100.0000 mg | ORAL_TABLET | Freq: Every day | ORAL | Status: DC
  Administered 2015-03-01 – 2015-03-02 (×2): 100 mg via ORAL
  Filled 2015-03-01 (×2): qty 1

## 2015-03-01 MED ORDER — HYDROCHLOROTHIAZIDE 25 MG PO TABS
12.5000 mg | ORAL_TABLET | Freq: Every day | ORAL | Status: DC
  Administered 2015-03-01 – 2015-03-02 (×2): 12.5 mg via ORAL
  Filled 2015-03-01 (×2): qty 1

## 2015-03-01 MED ORDER — POTASSIUM CHLORIDE CRYS ER 20 MEQ PO TBCR
40.0000 meq | EXTENDED_RELEASE_TABLET | Freq: Once | ORAL | Status: AC
  Administered 2015-03-01: 40 meq via ORAL
  Filled 2015-03-01: qty 2

## 2015-03-01 NOTE — Discharge Instructions (Signed)
Keep incision clean and dry for 3 days.  You can remove any outer dressing tomorrow. Keep a bandaid on for 2 days.  Keep wound check appointment. Call the office [phone]) for redness, drainage, swelling, or fever.

## 2015-03-01 NOTE — Progress Notes (Signed)
STROKE TEAM PROGRESS NOTE   SUBJECTIVE (INTERVAL HISTORY) Husband at bedside. They recounted history with Dr. Leonie Simon. Dr. Leonie Simon discussed and answered questions about loop placement, treatment, etc.   OBJECTIVE Temp:  [97.8 F (36.6 C)-98.7 F (37.1 C)] 97.9 F (36.6 C) (10/17 0756) Pulse Rate:  [65-81] 68 (10/17 0756) Cardiac Rhythm:  [-] Normal sinus rhythm (10/17 0800) Resp:  [16-19] 16 (10/17 0756) BP: (136-156)/(57-80) 147/73 mmHg (10/17 0756) SpO2:  [96 %-100 %] 97 % (10/17 0756) Weight:  [65.454 kg (144 lb 4.8 oz)] 65.454 kg (144 lb 4.8 oz) (10/17 0400)  CBC:   Recent Labs Lab 02/26/15 1716 02/26/15 1720 03/01/15 0324  WBC 6.1  --  5.9  NEUTROABS 3.4  --  3.3  HGB 14.3 15.3* 14.0  HCT 42.9 45.0 42.7  MCV 91.3  --  91.8  PLT 252  --  062    Basic Metabolic Panel:   Recent Labs Lab 02/26/15 1716 02/26/15 1720 03/01/15 0324  NA 137 143 137  K 3.4* 3.4* 3.3*  CL 106 105 104  CO2 24  --  24  GLUCOSE 110* 108* 95  BUN 11 13 12   CREATININE 0.67 0.60 0.78  CALCIUM 8.9  --  8.7*    Lipid Panel:     Component Value Date/Time   CHOL 156 02/27/2015 0416   TRIG 81 02/27/2015 0416   HDL 54 02/27/2015 0416   CHOLHDL 2.9 02/27/2015 0416   VLDL 16 02/27/2015 0416   LDLCALC 86 02/27/2015 0416   HgbA1c:  Lab Results  Component Value Date   HGBA1C 5.5 02/27/2015   Urine Drug Screen:     Component Value Date/Time   LABOPIA NONE DETECTED 02/26/2015 1955   COCAINSCRNUR NONE DETECTED 02/26/2015 1955   LABBENZ NONE DETECTED 02/26/2015 1955   AMPHETMU NONE DETECTED 02/26/2015 1955   THCU NONE DETECTED 02/26/2015 1955   LABBARB NONE DETECTED 02/26/2015 1955      IMAGING Ct Head Wo Contrast 02/27/2015 1. Findings suggestive of subacute ischemia in the left MCA distribution, as above. This is new compared to prior study 02/26/2015. 2. Mild cerebral atrophy and mild chronic microvascular ischemic changes in the cerebral white matter redemonstrated.    02/26/2015   No evidence of acute intracranial abnormality. Mild small vessel ischemic changes.   Mr Brain Wo Contrast 02/27/2015  Acute infarct in the left precentral cortex. Atrophy and chronic microvascular ischemia.  Mr Brianna Simon Head/brain Wo Cm 02/27/2015   Negative MRA head.  Carotid Doppler  There is 1-39% bilateral ICA stenosis. Vertebral artery flow is antegrade.    2D echo Left ventricle: The cavity size was normal. Wall thickness wasincreased in a pattern of mild LVH. Systolic function was normal.The estimated ejection fraction was in the range of 55% to 60%.Wall motion was normal; there were no regional wall motionabnormalities. Doppler parameters are consistent with abnormalleft ventricular relaxation (grade 1 diastolic dysfunction). - Mitral valve: Calcified annulus. Mildly thickened leaflets . There was mild regurgitation. Impressions:   No cardiac source of emboli was indentified.    PHYSICAL EXAM General - Well nourished, well developed, in no apparent distress.  Ophthalmologic - Sharp disc margins OU.   Cardiovascular - Regular rate and rhythm.  Mental Status -  Level of arousal and orientation to time, place, and person were intact. Language including expression, naming, repetition, comprehension was assessed and found intact,  . Mild dysarthria and word finding difficulty with nonfluent speech Fund of Knowledge was assessed and was intact.  Cranial  Nerves II - XII - II - Visual field intact OU. III, IV, VI - Extraocular movements intact. V - Facial sensation intact bilaterally. VII - mild right facial droop. VIII - Hearing & vestibular intact bilaterally. X - Palate elevates symmetrically. XI - Chin turning & shoulder shrug intact bilaterally. XII - Tongue protrusion intact.  Motor Strength - The patient's strength was normal in all extremities except right hand mild dexterity difficulty (with writing) and pronator drift was absent.  Bulk was normal  and fasciculations were absent.   Motor Tone - Muscle tone was assessed at the neck and appendages and was normal.  Reflexes - The patient's reflexes were 1+ in all extremities and she had no pathological reflexes.  Sensory - Light touch, temperature/pinprick were assessed and were symmetrical.    Coordination - The patient had normal movements in the hands and feet with no ataxia or dysmetria.  Tremor was absent.  Gait and Station - not tested due to safety concerns.    ASSESSMENT/PLAN Brianna Simon is a 64 y.o. female with history of previous embolic stroke in 3762, hypertension, mitral valve prolapse, anxiety, hyperlipidemia, presenting with expressive aphasia. She received TPA 60 mg at 1745 on 02/26/2015. Symptoms improving but with severe dysarthria.  Stroke:  Left precentral cortical infarct, felt to be  embolic, highly suspicious for afib due to hx of sudden on and off heart racing and palpitation.   Resultant dysarthria.  MRI  L precentral cortex infarct  MRA  Unremarkable  Carotid Doppler No significant stenosis   2D Echo No source of embolus   TEE 2011 no PFO. No source of embolus  loop recorder placement today for atrial fibrillation source. Cardiology notified   LDL 86  HgbA1c 5.5  Hypercoagulable work up pending lupus anticoag, beta 2 gly, CL abx  VTE prophylaxis - lovenox  Diet regular Room service appropriate?: Yes; Fluid consistency:: Thin  aspirin 325 mg orally every day prior to admission, now on aspirin 325 mg orally every day. Consider change to Aggrenox vs RESPECT ESUS study. Family to consider options.  Ongoing aggressive stroke risk factor management  Resumed home medications  Therapy recommendations: no therapy needs  Disposition:  Return home  Discharge home following loop recorder placement,  Hx of embolic stroke  12/3149 with left parietal small infarct  Had extensive stroke work up including TEE  Did not have cardiac  monitoring at that time  Was enrolled in research trial  On ASA  Follows with Dr. Leonie Simon as outpt in [place]. Last follow up one year ago.   Severe dysarthria  Passed swallow  Speech following  Much improved  Hypertension  Stable  Hyperlipidemia  Home meds: Zocor 20 mg daily   LDL 86, goal < 70  Change to Lipitor 40 mg daily  Continue statin at discharge   Other Stroke Risk Factors  Advanced age  Cigarette smoker, quit smoking 11 years ago.  ETOH use  Other Active Problems  Mild hypokalemia  Continue to follow up with Dr. Leonie Simon as outpt.  Hospital day # [place] for Pager information 03/01/2015 10:49 AM  I have personally examined this patient, reviewed notes, independently viewed imaging studies, participated in medical decision making and plan of care. I have made any additions or clarifications directly to the above note. Agree with note above. She has presented with recurrent left MCA branch infarct likely of embolic etiology without definite identified source. She had  a similar presentation in 2011 with negative workup. She remains at risk for neurological worsening, recurrent stroke and TIA. I had a long discussion with the patient and husband regarding treatment options including changing antiplatelet therapy to clopidogrel or Aggrenox as well as possible participation in the RESPECT ESUS trial. Patient will consider these options and let me know. Recommend placing loop recorder to look for paroxysmal atrial fibrillation.  Antony Contras, MD Medical Director [facility] Pager: [phone] 03/01/2015 4:47 PM   To contact Stroke Continuity provider, please refer to [email or web address] After hours, contact General Neurology

## 2015-03-01 NOTE — Evaluation (Addendum)
Occupational Therapy Evaluation Patient Details Name: Brianna Simon MRN: [id number] DOB: 12/20/1950 Today's Date: 03/01/2015    History of Present Illness 64 y.o. admitted with hx of prior CVA with residual right sided deficits, HTN, who presented with acute onset of expressive aphasia. MRI revealed L frontal parietal infarct with chronic L basal ganglia infarct.   Clinical Impression   Pt admitted with above. Pt with decreased coordination in [place]. Feel pt will benefit from acute OT to address RUE prior to d/c.     Follow Up Recommendations  No OT follow up    Equipment Recommendations  None recommended by OT    Recommendations for Other Services       Precautions / Restrictions Restrictions Weight Bearing Restrictions: No      Mobility Bed Mobility Overal bed mobility: Modified Independent                Transfers Overall transfer level: Independent                    Balance Overall balance assessment: No apparent balance deficits (not formally assessed)                                          ADL Overall ADL's : Needs assistance/impaired             Lower Body Bathing: Independent (standing)       Lower Body Dressing: Independent;Sit to/from Archivist: Independent (sit to stand from bed)           Functional mobility during ADLs: Independent       Vision Pt wears glasses all the time; reports no change from baseline Vision Assessment?: Yes Tracking/Visual Pursuits:  (cues for pt to keep head still-turning it to left; able to track in all quadrants-no apparent deficits) Visual Fields: No apparent deficits   Perception     Praxis      Pertinent Vitals/Pain Pain Assessment: No/denies pain     Hand Dominance Right   Extremity/Trunk Assessment Upper Extremity Assessment Upper Extremity Assessment: RUE deficits/detail RUE Sensation: decreased light touch RUE Coordination: decreased  fine motor;decreased gross motor   Lower Extremity Assessment Lower Extremity Assessment: Defer to PT evaluation       Communication Communication Communication: Expressive difficulties   Cognition Arousal/Alertness: Awake/alert Behavior During Therapy: WFL for tasks assessed/performed Overall Cognitive Status: Within Functional Limits for tasks assessed                     General Comments       Exercises  educated on opposition exercise. Educated on fine and gross motor coordination activities pt can be doing to help improve RUE.     Shoulder Instructions      Home Living Family/patient expects to be discharged to:: Private residence Living Arrangements: Spouse/significant other Available Help at Discharge: Family;Available PRN/intermittently Type of Home: House Home Access: Stairs to enter [employer] of Steps: 2 Entrance Stairs-Rails: None Home Layout: One level     Bathroom Shower/Tub: Tub/shower unit;Walk-in shower   Bathroom Toilet: Standard     Home Equipment: None          Prior Functioning/Environment Level of Independence: Independent             OT Diagnosis: Other (comment) (decreased coordination)   OT Problem List: Decreased coordination;Impaired sensation;Impaired UE  functional use   OT Treatment/Interventions: Self-care/ADL training;Therapeutic exercise;Therapeutic activities;Patient/family education    OT Goals(Current goals can be found in the care plan section) Acute Rehab OT Goals Patient Stated Goal: not stated OT Goal Formulation: With patient Time For Goal Achievement: 03/08/15 Potential to Achieve Goals: Good ADL Goals Additional ADL Goal #1: Pt will independently perform HEP for RUE to increase fine motor and gross motor coordination.  OT Frequency: Min 2X/week   Barriers to D/C:            Co-evaluation              End of Session Nurse Communication: Other (comment) (d/c recommendation; page  OT if pt leaving today)  Activity Tolerance: Patient tolerated treatment well Patient left: in bed;with call bell/phone within reach   Time: 7654-6503 OT Time Calculation (min): 16 min Charges:  OT General Charges $OT Visit: 1 Procedure OT Evaluation $Initial OT Evaluation Tier I: 1 Procedure G-CodesBenito Mccreedy OTR/L [id number] 03/01/2015, 10:11 AM

## 2015-03-01 NOTE — Consult Note (Signed)
ELECTROPHYSIOLOGY CONSULT NOTE  Patient ID: Brianna Simon MRN: [id number], DOB/AGE: July 22, 1950   Admit date: 02/26/2015 Date of Consult: 03/01/2015  Primary Physician: Garret Reddish, MD Primary Cardiologist: New Reason for Consultation: Cryptogenic stroke; recommendations regarding Implantable Loop Recorder  History of Present Illness Brianna Simon was admitted on 02/26/2015 with acute onset of expressive aphasia.  Imaging demonstrated acute infarct in the left frontal parietal cortex, s/p TPA.  She has undergone workup for stroke including echocardiogram and carotid dopplers (1-39% stenosis bilaterally).    The patient has been monitored on telemetry which has demonstrated sinus rhythm with no arrhythmias.  Inpatient stroke work-up is completed with an echo, results below.   Echocardiogram this admission demonstrated normal LV cavity size. Wall thickness was increased in a pattern of mild LVH. Systolic function was normal. The estimated ejection fraction was in the range of 55% to 60%. Wall motion was normal; there were no regional wall motion abnormalities. Doppler parameters are consistent with abnormalleft ventricular relaxation (grade 1 diastolic dysfunction). Calcified mitral annulus. Mildly thickened leaflets. There was mild regurgitation. No cardiac source of emboli was indentified.   Lab work is remarkable for K+ 3.3, supplemented with 40 meq.  Prior to admission, the patient denies chest pain, shortness of breath, dizziness, palpitations, or syncope.  They are recovering from their stroke with plans to return home at discharge.  EP has been asked to evaluate for placement of an implantable loop recorder to monitor for atrial fibrillation. TEE performed in 2011 for previous CVA was negative, monitoring not performed.  ROS is negative except as outlined above.    Past Medical History  Diagnosis Date  . Hypertension   . Mitral valve prolapse     does not cause  patient any problems  . Hx: UTI (urinary tract infection)   . Anxiety   . GERD (gastroesophageal reflux disease)   . Hyperlipidemia   . Stroke [facility]) 2011    mild  . Seasonal allergies   . Arthritis     hips, hands, lower back - otc med prn  . Cancer ([place])     skin cancer left wrist and left chest  . SVD (spontaneous vaginal delivery)     x 1 - gave up for adoption     Surgical History:  Past Surgical History  Procedure Laterality Date  . Colonoscopy    . Sinus surgery with instatrak    . Lipoma removed      right shoulder  . Wisdom tooth extraction    . Dilatation & curettage/hysteroscopy with trueclear N/A 10/24/2013    Procedure: DILATATION & CURETTAGE/HYSTEROSCOPY WITH TRUCLEAR;  Surgeon: Logan Bores, MD;  Location: [facility];  Service: Gynecology;  Laterality: N/A;  1 1/2hrs OR time     Prescriptions prior to admission  Medication Sig Dispense Refill Last Dose  . aspirin 325 MG tablet Take 162.5 mg by mouth daily.   02/26/2015 at Unknown time  . cetirizine (ZYRTEC) 10 MG tablet Take 10 mg by mouth daily.   Past Week at Unknown time  . fluticasone (FLONASE) 50 MCG/ACT nasal spray Place 2 sprays into both nostrils daily.  6 02/26/2015 at Unknown time  . hydrochlorothiazide (HYDRODIURIL) 25 MG tablet Take 0.5 tablets (12.5 mg total) by mouth daily. 45 tablet 3 02/26/2015 at Unknown time  . Multiple Vitamin (MULTIVITAMIN WITH MINERALS) TABS tablet Take 1 tablet by mouth daily.   02/26/2015 at Unknown time  . pantoprazole (PROTONIX) 40 MG tablet Take 1  tablet (40 mg total) by mouth daily. 90 tablet 3 02/26/2015 at Unknown time  . sertraline (ZOLOFT) 100 MG tablet TAKE 1 TABLET BY MOUTH DAILY 90 tablet 3 02/26/2015 at Unknown time  . simvastatin (ZOCOR) 20 MG tablet Take 1 tablet (20 mg total) by mouth at bedtime. 90 tablet 3 Past Week at Unknown time  . VITAMIN D, CHOLECALCIFEROL, PO Take 1 tablet by mouth daily.    Past Week at Unknown time    Inpatient Medications:  .  aspirin  325 mg Oral Daily  . atorvastatin  40 mg Oral q1800  . enoxaparin (LOVENOX) injection  40 mg Subcutaneous Q24H  . hydrochlorothiazide  12.5 mg Oral Daily  . multivitamin with minerals  1 tablet Oral Daily  . pantoprazole  40 mg Oral Daily  . potassium chloride  40 mEq Oral Once  . sertraline  100 mg Oral Daily    Allergies:  Allergies  Allergen Reactions  . Citalopram Hydrobromide     REACTION: blurred vision  . Codeine Phosphate     Liquid formulation only bothers her; tolerates percocet fine.  REACTION: nausea,vomiting  . Nitrofurantoin     REACTION: itching  . Nsaids Other (See Comments)    Elevated liver panel  . Sulfamethoxazole-Trimethoprim     REACTION: itching    Social History   Social History  . Marital Status: Married    Spouse Name: N/A  . Number of Children: N/A  . Years of Education: N/A   Occupational History  . [employer]   Social History Main Topics  . Smoking status: Former Smoker -- 2.00 packs/day for 30 years    Types: Cigarettes    Quit date: 03/17/2003  . Smokeless tobacco: Never Used  . Alcohol Use: Yes     Comment: occasionally  . Drug Use: No  . Sexual Activity: Yes    Birth Control/ Protection: Post-menopausal   Other Topics Concern  . Not on file   Social History Narrative   Caffeine Use: daily     Family History  Problem Relation Age of Onset  . Arrhythmia Mother 39    pacemaker   . Diverticulitis Other     Family Status  Relation Status Death Age  . Mother Deceased   . Father Deceased      Physical Exam: Filed Vitals:   02/28/15 2230 02/28/15 2348 03/01/15 0400 03/01/15 0756  BP: 156/67 145/80 137/76 147/73  Pulse: 65 72 81 68  Temp: 98.2 F (36.8 C) 97.9 F (36.6 C) 98.6 F (37 C) 97.9 F (36.6 C)  TempSrc: Oral   Oral  Resp:  18 18 16   Height:      Weight:   144 lb 4.8 oz (65.454 kg)   SpO2: 99% 100% 98% 97%    GEN- The patient is well appearing, alert and oriented x 3  today.  Still has dysarthria Head- normocephalic, atraumatic Eyes-  Sclera clear, conjunctiva pink Ears- hearing intact Oropharynx- clear Neck- supple, Lungs- Clear to ausculation bilaterally, normal work of breathing Heart- Regular rate and rhythm, soft murmur, no rubs or gallops, PMI not laterally displaced GI- soft, NT, ND, + BS Extremities- no clubbing, cyanosis, or edema MS- no significant deformity or atrophy Skin- no rash or lesion Psych- euthymic mood, full affect   Labs:   Lab Results  Component Value Date   WBC 5.9 03/01/2015   HGB 14.0 03/01/2015   HCT 42.7 03/01/2015   MCV 91.8 03/01/2015  PLT 252 03/01/2015    Recent Labs Lab 02/26/15 1716  03/01/15 0324  NA 137  < > 137  K 3.4*  < > 3.3*  CL 106  < > 104  CO2 24  --  24  BUN 11  < > 12  CREATININE 0.67  < > 0.78  CALCIUM 8.9  --  8.7*  PROT 6.6  --   --   BILITOT 0.6  --   --   ALKPHOS 109  --   --   ALT 22  --   --   AST 24  --   --   GLUCOSE 110*  < > 95  < > = values in this interval not displayed.  Lab Results  Component Value Date   CHOL 156 02/27/2015   CHOL 162 10/27/2014   CHOL 128 05/31/2012   Lab Results  Component Value Date   HDL 54 02/27/2015   HDL 74 10/27/2014   HDL 49.10 05/31/2012   Lab Results  Component Value Date   LDLCALC 86 02/27/2015   LDLCALC 76 10/27/2014   LDLCALC 63 05/31/2012   Lab Results  Component Value Date   TRIG 81 02/27/2015   TRIG 62 10/27/2014   TRIG 80.0 05/31/2012   Lab Results  Component Value Date   CHOLHDL 2.9 02/27/2015   CHOLHDL 2.2 10/27/2014   CHOLHDL 3 05/31/2012    Radiology/Studies: Ct Head Wo Contrast 02/27/2015  CLINICAL DATA:  64 year old female admitted with history of aphasia. Persistent slurred speech. Possible stroke. EXAM: CT HEAD WITHOUT CONTRAST TECHNIQUE: Contiguous axial images were obtained from the base of the skull through the vertex without intravenous contrast. COMPARISON:  Head CT 02/26/2015. FINDINGS: Today's  study now demonstrates loss of gray-white differentiation in the cortical and subcortical regions of the left posterior frontal lobe in the MCA distribution, presumably related to subacute ischemia. Patchy areas of mild decreased attenuation are noted throughout the deep and periventricular white matter of the cerebral hemispheres bilaterally, compatible with mild chronic microvascular ischemic disease. Mild cerebral atrophy. No signs of acute intracranial hemorrhage, no mass, mass effect, hydrocephalus or abnormal intra or extra-axial fluid collections. Visualized paranasal sinuses and mastoids are well pneumatized. No acute displaced skull fractures. IMPRESSION: 1. Findings suggestive of subacute ischemia in the left MCA distribution, as above. This is new compared to prior study 02/26/2015. 2. Mild cerebral atrophy and mild chronic microvascular ischemic changes in the cerebral white matter redemonstrated. These results were called by telephone at the time of interpretation on 02/27/2015 at 5:22 pm to nurse [facility] for Dr. Jim Like, who verbally acknowledged these results. Electronically Signed   By: Vinnie Langton M.D.   On: 02/27/2015 17:24   Mr Jodene Nam Head/brain Wo Cm 02/27/2015  CLINICAL DATA:  Expressive aphasia.  Right-sided deficit. EXAM: MRI HEAD WITHOUT CONTRAST MRA HEAD WITHOUT CONTRAST TECHNIQUE: Multiplanar, multiecho pulse sequences of the brain and surrounding structures were obtained without intravenous contrast. Angiographic images of the head were obtained using MRA technique without contrast. COMPARISON:  CT head 02/27/2015 FINDINGS: MRI HEAD FINDINGS Acute infarct in the left frontal parietal cortex. This appears to be the precentral gyrus. No other areas of acute infarct. Mild chronic microvascular ischemic change in the cerebral white matter. Chronic infarct in the left parietal lobe over the convexity. Small chronic infarcts in the cerebellum bilaterally. Small chronic infarct left  anterior basal ganglia. Negative for hemorrhage.  Negative for mass or edema. Mild atrophy.  Negative for hydrocephalus. Mild mucosal  edema in the paranasal sinuses. MRA HEAD FINDINGS Both vertebral arteries widely patent to the basilar. PICA patent bilaterally. Basilar normal. Superior cerebellar and posterior cerebral is normal Internal carotid artery widely patent bilaterally. Anterior and middle cerebral arteries are normal Negative for cerebral aneurysm. IMPRESSION: Acute infarct in the left precentral cortex. Atrophy and chronic microvascular ischemia. Negative MRA head. Electronically Signed   By: Franchot Gallo M.D.   On: 02/27/2015 20:54    12-lead ECG 02/26/2015 SR, no acute ischemic changes All prior EKG's in EPIC reviewed with no documented atrial fibrillation  Telemetry: SR, ST  Assessment and Plan:  1. Cryptogenic stroke The patient presents with cryptogenic stroke.  The patient has no TEE planned.  I spoke with the patient about monitoring for afib with either a 30 day event monitor or an implantable loop recorder.  Risks, benefits, and alteratives to implantable loop recorder were discussed with the patient today.   At this time, the patient is very clear in her decision to proceed with implantable loop recorder.   Wound care was reviewed with the patient (keep incision clean and dry for 3 days).  Wound check Hannan Hutmacher be scheduled prior to d/c.  Please call with questions.  Lenoard Aden 03/01/2015 11:40 AM Beeper [phone]  I have seen and examined this patient with Rosaria Ferries.  Agree with above, note added to reflect my findings.  On exam, regular rhythm, no murmurs, lungs clear.  Patient had a stroke with residual speech deficits.  Had workup 5 years prior which showed no evidence of cause.  Zalia Hautala plan for LINQ placement.    Aranda Bihm M. Donia Yokum MD 03/02/2015 7:01 AM

## 2015-03-02 ENCOUNTER — Encounter (HOSPITAL_COMMUNITY): Payer: Self-pay | Admitting: Cardiology

## 2015-03-02 ENCOUNTER — Encounter (HOSPITAL_COMMUNITY)
Admission: EM | Disposition: A | Discharge status: Home / Self Care | Payer: Self-pay | Source: Home / Self Care | Attending: Neurology

## 2015-03-02 DIAGNOSIS — I639 Cerebral infarction, unspecified: Secondary | ICD-10-CM

## 2015-03-02 DIAGNOSIS — R471 Dysarthria and anarthria: Secondary | ICD-10-CM | POA: Diagnosis present

## 2015-03-02 DIAGNOSIS — E876 Hypokalemia: Secondary | ICD-10-CM | POA: Diagnosis present

## 2015-03-02 HISTORY — PX: EP IMPLANTABLE DEVICE: SHX172B

## 2015-03-02 LAB — LUPUS ANTICOAGULANT PANEL
DRVVT: 29.6 s (ref 0.0–55.1)
PTT Lupus Anticoagulant: 31.6 s (ref 0.0–50.0)

## 2015-03-02 SURGERY — LOOP RECORDER INSERTION

## 2015-03-02 MED ORDER — ASPIRIN 325 MG PO TABS: 325.0000 mg | ORAL_TABLET | Freq: Every day | ORAL | Status: DC

## 2015-03-02 MED ORDER — ATORVASTATIN CALCIUM 40 MG PO TABS
40.0000 mg | ORAL_TABLET | Freq: Every day | ORAL | Status: DC
Start: 2015-03-02 — End: 2015-06-02

## 2015-03-02 MED ORDER — LIDOCAINE-EPINEPHRINE 1 %-1:100000 IJ SOLN
INTRAMUSCULAR | Status: AC
  Filled 2015-03-02: qty 1

## 2015-03-02 MED ORDER — LIDOCAINE-EPINEPHRINE 1 %-1:100000 IJ SOLN
INTRAMUSCULAR | Status: DC | PRN
  Administered 2015-03-02: 40 mL

## 2015-03-02 SURGICAL SUPPLY — 2 items
LOOP REVEAL LINQSYS (Prosthesis & Implant Heart) ×2 IMPLANT
PACK LOOP INSERTION (CUSTOM PROCEDURE TRAY) ×2 IMPLANT

## 2015-03-02 NOTE — Progress Notes (Signed)
STROKE TEAM PROGRESS NOTE   SUBJECTIVE (INTERVAL HISTORY) Husband at bedside. Patient has had her loop recorder placed today.    OBJECTIVE Temp:  [97.9 F (36.6 C)-98.4 F (36.9 C)] 98.4 F (36.9 C) (10/18 1114) Pulse Rate:  [65-84] 83 (10/18 1114) Cardiac Rhythm:  [-] Normal sinus rhythm (10/18 0700) Resp:  [17-20] 17 (10/18 1114) BP: (123-152)/(57-75) 133/65 mmHg (10/18 1114) SpO2:  [93 %-97 %] 93 % (10/18 1114) Weight:  [65.499 kg (144 lb 6.4 oz)] 65.499 kg (144 lb 6.4 oz) (10/18 0500)  CBC:   Recent Labs Lab 02/26/15 1716 02/26/15 1720 03/01/15 0324  WBC 6.1  --  5.9  NEUTROABS 3.4  --  3.3  HGB 14.3 15.3* 14.0  HCT 42.9 45.0 42.7  MCV 91.3  --  91.8  PLT 252  --  824    Basic Metabolic Panel:   Recent Labs Lab 02/26/15 1716 02/26/15 1720 03/01/15 0324  NA 137 143 137  K 3.4* 3.4* 3.3*  CL 106 105 104  CO2 24  --  24  GLUCOSE 110* 108* 95  BUN 11 13 12   CREATININE 0.67 0.60 0.78  CALCIUM 8.9  --  8.7*    Lipid Panel:     Component Value Date/Time   CHOL 156 02/27/2015 0416   TRIG 81 02/27/2015 0416   HDL 54 02/27/2015 0416   CHOLHDL 2.9 02/27/2015 0416   VLDL 16 02/27/2015 0416   LDLCALC 86 02/27/2015 0416   HgbA1c:  Lab Results  Component Value Date   HGBA1C 5.5 02/27/2015   Urine Drug Screen:     Component Value Date/Time   LABOPIA NONE DETECTED 02/26/2015 1955   COCAINSCRNUR NONE DETECTED 02/26/2015 1955   LABBENZ NONE DETECTED 02/26/2015 1955   AMPHETMU NONE DETECTED 02/26/2015 1955   THCU NONE DETECTED 02/26/2015 1955   LABBARB NONE DETECTED 02/26/2015 1955      IMAGING Ct Head Wo Contrast 02/27/2015 1. Findings suggestive of subacute ischemia in the left MCA distribution, as above. This is new compared to prior study 02/26/2015. 2. Mild cerebral atrophy and mild chronic microvascular ischemic changes in the cerebral white matter redemonstrated.   02/26/2015   No evidence of acute intracranial abnormality. Mild small vessel  ischemic changes.   Mr Brain Wo Contrast 02/27/2015  Acute infarct in the left precentral cortex. Atrophy and chronic microvascular ischemia.  Mr Jodene Nam Head/brain Wo Cm 02/27/2015   Negative MRA head.  Carotid Doppler  There is 1-39% bilateral ICA stenosis. Vertebral artery flow is antegrade.    2D echo Left ventricle: The cavity size was normal. Wall thickness wasincreased in a pattern of mild LVH. Systolic function was normal.The estimated ejection fraction was in the range of 55% to 60%.Wall motion was normal; there were no regional wall motionabnormalities. Doppler parameters are consistent with abnormalleft ventricular relaxation (grade 1 diastolic dysfunction). - Mitral valve: Calcified annulus. Mildly thickened leaflets . There was mild regurgitation. Impressions:   No cardiac source of emboli was indentified.    PHYSICAL EXAM General - Well nourished, well developed, in no apparent distress.  Ophthalmologic - Sharp disc margins OU.   Cardiovascular - Regular rate and rhythm.  Mental Status -  Level of arousal and orientation to time, place, and person were intact. Language including expression, naming, repetition, comprehension was assessed and found intact,  . Mild dysarthria and word finding difficulty with nonfluent speech Fund of Knowledge was assessed and was intact.  Cranial Nerves II - XII - II - Visual  field intact OU. III, IV, VI - Extraocular movements intact. V - Facial sensation intact bilaterally. VII - mild right facial droop. VIII - Hearing & vestibular intact bilaterally. X - Palate elevates symmetrically. XI - Chin turning & shoulder shrug intact bilaterally. XII - Tongue protrusion intact.  Motor Strength - The patient's strength was normal in all extremities except right hand mild dexterity difficulty (with writing) and pronator drift was absent.  Bulk was normal and fasciculations were absent.   Motor Tone - Muscle tone was assessed at the neck  and appendages and was normal.  Reflexes - The patient's reflexes were 1+ in all extremities and she had no pathological reflexes.  Sensory - Light touch, temperature/pinprick were assessed and were symmetrical.    Coordination - The patient had normal movements in the hands and feet with no ataxia or dysmetria.  Tremor was absent.  Gait and Station - not tested due to safety concerns.    ASSESSMENT/PLAN Brianna Simon is a 64 y.o. female with history of previous embolic stroke in 4481, hypertension, mitral valve prolapse, anxiety, hyperlipidemia, presenting with expressive aphasia. She received TPA 60 mg at 1745 on 02/26/2015. Symptoms improving but with severe dysarthria.  Stroke:  Left precentral cortical infarct, felt to be  embolic, highly suspicious for afib due to hx of sudden on and off heart racing and palpitation.   Resultant dysarthria.  MRI  L precentral cortex infarct  MRA  Unremarkable  Carotid Doppler No significant stenosis   2D Echo No source of embolus   TEE 2011 no PFO. No source of embolus  loop recorder placed   LDL 86  HgbA1c 5.5  Hypercoagulable work up pending lupus anticoag, CL abx.  Beta 2 gly neg  VTE prophylaxis - lovenox  Diet regular Room service appropriate?: Yes; Fluid consistency:: Thin  aspirin 162 mg orally every day prior to admission, now on aspirin 325 mg orally every day. Continue aspirin for now until research study   Ongoing aggressive stroke risk factor management  Resumed home medications  Therapy recommendations: no therapy needs  Disposition:  Return home today  Research associates to follow up for RESPECT ESUS as an OP in 1 week.  Hx of embolic stroke  12/5629 with left parietal small infarct  Had extensive stroke work up including TEE  Did not have cardiac monitoring at that time  Was enrolled in research trial, [facility]  On ASA  Follows with Dr. Leonie Man as outpt in [place]. Last follow up one year ago.    Severe dysarthria  Passed swallow  Speech following  Much improved  Hypertension  Stable  Hyperlipidemia  Home meds: Zocor 20 mg daily   LDL 86, goal < 70  Change to Lipitor 40 mg daily  Continue statin at discharge   Other Stroke Risk Factors  Advanced age  Cigarette smoker, quit smoking 11 years ago.  ETOH use  Other Active Problems  Mild hypokalemia  Continue to follow up with Dr. Leonie Man as outpt.  Hospital day # [place] for Pager information 03/02/2015 2:15 PM  I have personally examined this patient, reviewed notes, independently viewed imaging studies, participated in medical decision making and plan of care. I have made any additions or clarifications directly to the above note. Agree with note above.    Antony Contras, MD Medical Director [facility] Pager: [phone] 03/02/2015 5:35 PM  To contact Stroke Continuity provider, please refer to  [email or web address] After hours, contact General Neurology

## 2015-03-02 NOTE — Discharge Summary (Signed)
Stroke Discharge Summary  Patient ID: Brianna Simon   MRN: [id number]      DOB: 02/28/1951  Date of Admission: 02/26/2015 Date of Discharge: 03/02/2015  Attending Physician:  Garvin Fila, MD, Stroke MD  Consulting Physician(s):      Will Curt Bears, MD (electrophysiology)  Patient's PCP:  Garret Reddish, MD  DISCHARGE DIAGNOSIS:  Principal Problem:   Cryptogenic stroke ([place]) - Left precentral cortical infarct, felt to be embolic Active Problems:   Hyperlipidemia   Essential hypertension   History of CVA (cerebrovascular accident)   Dysarthria   Hypokalemia  BMI: Body mass index is 26.4 kg/(m^2).  Past Medical History  Diagnosis Date  . Hypertension   . Mitral valve prolapse     does not cause patient any problems  . Hx: UTI (urinary tract infection)   . Anxiety   . GERD (gastroesophageal reflux disease)   . Hyperlipidemia   . Stroke [facility]) 2011    mild  . Seasonal allergies   . Arthritis     hips, hands, lower back - otc med prn  . Cancer ([place])     skin cancer left wrist and left chest  . SVD (spontaneous vaginal delivery)     x 1 - gave up for adoption   Past Surgical History  Procedure Laterality Date  . Colonoscopy    . Sinus surgery with instatrak    . Lipoma removed      right shoulder  . Wisdom tooth extraction    . Dilatation & curettage/hysteroscopy with trueclear N/A 10/24/2013    Procedure: DILATATION & CURETTAGE/HYSTEROSCOPY WITH TRUCLEAR;  Surgeon: Logan Bores, MD;  Location: [facility];  Service: Gynecology;  Laterality: N/A;  1 1/2hrs OR time  . Ep implantable device N/A 03/02/2015    Procedure: Loop Recorder Insertion;  Surgeon: Will Meredith Leeds, MD;  Location: [facility];  Service: Cardiovascular;  Laterality: N/A;      Medication List    STOP taking these medications        simvastatin 20 MG tablet  Commonly known as:  ZOCOR      TAKE these medications        aspirin 325 MG tablet  Take 1 tablet (325 mg total)  by mouth daily.     atorvastatin 40 MG tablet  Commonly known as:  LIPITOR  Take 1 tablet (40 mg total) by mouth daily at 6 PM.     cetirizine 10 MG tablet  Commonly known as:  ZYRTEC  Take 10 mg by mouth daily.     fluticasone 50 MCG/ACT nasal spray  Commonly known as:  FLONASE  Place 2 sprays into both nostrils daily.     hydrochlorothiazide 25 MG tablet  Commonly known as:  HYDRODIURIL  Take 0.5 tablets (12.5 mg total) by mouth daily.     multivitamin with minerals Tabs tablet  Take 1 tablet by mouth daily.     pantoprazole 40 MG tablet  Commonly known as:  PROTONIX  Take 1 tablet (40 mg total) by mouth daily.     sertraline 100 MG tablet  Commonly known as:  ZOLOFT  TAKE 1 TABLET BY MOUTH DAILY     VITAMIN D (CHOLECALCIFEROL) PO  Take 1 tablet by mouth daily.        LABORATORY STUDIES CBC    Component Value Date/Time   WBC 5.9 03/01/2015 0324   RBC 4.65 03/01/2015 0324   HGB 14.0 03/01/2015 0324  HCT 42.7 03/01/2015 0324   PLT 252 03/01/2015 0324   MCV 91.8 03/01/2015 0324   MCH 30.1 03/01/2015 0324   MCHC 32.8 03/01/2015 0324   RDW 12.9 03/01/2015 0324   LYMPHSABS 1.9 03/01/2015 0324   MONOABS 0.5 03/01/2015 0324   EOSABS 0.2 03/01/2015 0324   BASOSABS 0.0 03/01/2015 0324   CMP    Component Value Date/Time   NA 137 03/01/2015 0324   K 3.3* 03/01/2015 0324   CL 104 03/01/2015 0324   CO2 24 03/01/2015 0324   GLUCOSE 95 03/01/2015 0324   BUN 12 03/01/2015 0324   CREATININE 0.78 03/01/2015 0324   CREATININE 0.54 10/27/2014 1144   CALCIUM 8.7* 03/01/2015 0324   PROT 6.6 02/26/2015 1716   ALBUMIN 3.8 02/26/2015 1716   AST 24 02/26/2015 1716   ALT 22 02/26/2015 1716   ALKPHOS 109 02/26/2015 1716   BILITOT 0.6 02/26/2015 1716   GFRNONAA >60 03/01/2015 0324   GFRAA >60 03/01/2015 0324   COAGS Lab Results  Component Value Date   INR 1.10 02/26/2015   INR 1.08 06/02/2009   Lipid Panel    Component Value Date/Time   CHOL 156 02/27/2015 0416    TRIG 81 02/27/2015 0416   HDL 54 02/27/2015 0416   CHOLHDL 2.9 02/27/2015 0416   VLDL 16 02/27/2015 0416   LDLCALC 86 02/27/2015 0416   HgbA1C  Lab Results  Component Value Date   HGBA1C 5.5 02/27/2015   Urinalysis    Component Value Date/Time   COLORURINE YELLOW 02/26/2015 1955   APPEARANCEUR CLEAR 02/26/2015 1955   LABSPEC 1.007 02/26/2015 1955   PHURINE 6.5 02/26/2015 1955   GLUCOSEU NEGATIVE 02/26/2015 1955   HGBUR NEGATIVE 02/26/2015 1955   BILIRUBINUR NEGATIVE 02/26/2015 1955   KETONESUR NEGATIVE 02/26/2015 1955   PROTEINUR NEGATIVE 02/26/2015 1955   UROBILINOGEN 0.2 02/26/2015 1955   NITRITE NEGATIVE 02/26/2015 1955   LEUKOCYTESUR TRACE* 02/26/2015 1955   Urine Drug Screen     Component Value Date/Time   LABOPIA NONE DETECTED 02/26/2015 1955   COCAINSCRNUR NONE DETECTED 02/26/2015 1955   LABBENZ NONE DETECTED 02/26/2015 1955   AMPHETMU NONE DETECTED 02/26/2015 1955   THCU NONE DETECTED 02/26/2015 1955   LABBARB NONE DETECTED 02/26/2015 1955    Alcohol Level    Component Value Date/Time   ETH <5 02/26/2015 1716     SIGNIFICANT DIAGNOSTIC STUDIES Ct Head Wo Contrast 02/27/2015 1. Findings suggestive of subacute ischemia in the left MCA distribution, as above. This is new compared to prior study 02/26/2015. 2. Mild cerebral atrophy and mild chronic microvascular ischemic changes in the cerebral white matter redemonstrated.   02/26/2015  No evidence of acute intracranial abnormality. Mild small vessel ischemic changes.   Mr Brain Wo Contrast 02/27/2015 Acute infarct in the left precentral cortex. Atrophy and chronic microvascular ischemia.  Mr Jodene Nam Head/brain Wo Cm 02/27/2015 Negative MRA head.  Carotid Doppler There is 1-39% bilateral ICA stenosis. Vertebral artery flow is antegrade.   2D echo Left ventricle: The cavity size was normal. Wall thickness wasincreased in a pattern of mild LVH. Systolic function was normal.The estimated ejection  fraction was in the range of 55% to 60%.Wall motion was normal; there were no regional wall motionabnormalities. Doppler parameters are consistent with abnormalleft ventricular relaxation (grade 1 diastolic dysfunction). - Mitral valve: Calcified annulus. Mildly thickened leaflets . There was mild regurgitation. Impressions: No cardiac source of emboli was indentified.     HISTORY OF PRESENT ILLNESS Brianna Simon is an  63 y.o. female hx of prior CVA with residual right sided deficits, HTN presenting with acute onset of expressive aphasia. 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 EMS called, noted she had expressive aphasia, they report it has gotten worse upon arrival to the ED. She reports talking to someone around 1550, had no speech deficits at that time. Modified Rankin: Rankin Score=1   CT head imaging reviewed, no acute process. Reviewed prior MRI from 2011, shows small left sided acute infarct. No signs of hemorrhage. Patient takes daily ASA, not on anticoagulation.   Risk/benefits of tPA discussed extensively with patient and spouse. Discussed risk of ICH vs risk of progression of stroke symptoms. Patient and spouse consented to tPA.   Patient was last known well 02/26/2015 at 1550. IV tPA was administered. She was admitted to the neuro ICU for further evaluation and treatment.   HOSPITAL COURSE Brianna Simon is a 64 y.o. female with history of previous embolic stroke in 1638, hypertension, mitral valve prolapse, anxiety, hyperlipidemia, presenting with expressive aphasia. She received TPA 60 mg at 1745 on 02/26/2015. Symptoms improving but with severe dysarthria.  Stroke: Left precentral cortical infarct, felt to be embolic, highly suspicious for afib due to hx of sudden on and off heart racing and palpitation.   Resultant dysarthria.  MRI L precentral cortex  infarct  MRA Unremarkable  Carotid Doppler No significant stenosis   2D Echo No source of embolus  TEE 2011 no PFO. No source of embolus  loop recorder placed  LDL 86  HgbA1c 5.5  Hypercoagulable work up pending lupus anticoag, CL abx. Beta 2 gly neg  aspirin 162 mg orally every day prior to admission, now on aspirin 325 mg orally every day. Continue aspirin for now until research study   Ongoing aggressive stroke risk factor management  Therapy recommendations: no therapy needs  Disposition: Return home today  Research associates to follow up for RESPECT ESUS as an OP in 1 week.  Follows with Dr. Leonie Man as outpt in [place]. Last follow up one year ago. appt requested  Hx of embolic stroke  08/6657 with left parietal small infarct  Had extensive stroke work up including TEE  Did not have cardiac monitoring at that time  Was enrolled in research trial, [facility]  Severe dysarthria  Passed swallow  Speech following  Much improved  Hypertension  Stable  Hyperlipidemia  Home meds: Zocor 20 mg daily  LDL 86, goal < 70  Change to Lipitor 40 mg daily  Continue statin at discharge  Other Stroke Risk Factors  Advanced age  Cigarette smoker, quit smoking 11 years ago.  ETOH use  Other Active Problems  Mild hypokalemia, 3.3   DISCHARGE EXAM Blood pressure 133/65, pulse 83, temperature 98.4 F (36.9 C), temperature source Oral, resp. rate 17, height 5\' 2"  (1.575 m), weight 65.499 kg (144 lb 6.4 oz), SpO2 93 %. General - Well nourished, well developed, in no apparent distress.  Ophthalmologic - Sharp disc margins OU.   Cardiovascular - Regular rate and rhythm.  Mental Status -  Level of arousal and orientation to time, place, and person were intact. Language including expression, naming, repetition, comprehension was assessed and found intact, . Mild dysarthria and word finding difficulty with nonfluent speech Fund of Knowledge was assessed and  was intact.  Cranial Nerves II - XII - II - Visual field intact OU. III, IV, VI -  Extraocular movements intact. V - Facial sensation intact bilaterally. VII - mild right facial droop. VIII - Hearing & vestibular intact bilaterally. X - Palate elevates symmetrically. XI - Chin turning & shoulder shrug intact bilaterally. XII - Tongue protrusion intact.  Motor Strength - The patient's strength was normal in all extremities except right hand mild dexterity difficulty (with writing) and pronator drift was absent. Bulk was normal and fasciculations were absent.  Motor Tone - Muscle tone was assessed at the neck and appendages and was normal.  Reflexes - The patient's reflexes were 1+ in all extremities and she had no pathological reflexes.  Sensory - Light touch, temperature/pinprick were assessed and were symmetrical.   Coordination - The patient had normal movements in the hands and feet with no ataxia or dysmetria. Tremor was absent.  Gait and Station - not tested due to safety concerns.   Discharge Diet   Diet regular Room service appropriate?: Yes; Fluid consistency:: Thin liquids  DISCHARGE PLAN  Disposition:  Home with husband   aspirin 325 mg orally every day for secondary stroke prevention.  Consider for RESPECT ESUS in 1 week  Follow-up Garret Reddish, MD in 2 weeks.  Follow-up with Dr. Antony Contras, Stroke Clinic in 2 months.  32 minutes were spent preparing discharge.  [place] for Pager information 03/02/2015 2:55 PM  I have personally examined this patient, reviewed notes, independently viewed imaging studies, participated in medical decision making and plan of care. I have made any additions or clarifications directly to the above note. Agree with note above.    Antony Contras, MD Medical Director [facility] Pager: [phone] 03/02/2015 5:35 PM

## 2015-03-02 NOTE — Progress Notes (Addendum)
Occupational Therapy Treatment Patient Details Name: Brianna Simon MRN: [id number] DOB: 16-Mar-1951 Today's Date: 03/02/2015    History of present illness 64 y.o. admitted with hx of prior CVA with residual right sided deficits, HTN, who presented with acute onset of expressive aphasia. MRI revealed L frontal parietal infarct with chronic L basal ganglia infarct. Pt s/p placement of loop recorder.   OT comments  Provided fine motor coordination handout in session and educated on coordination exercise/activities.   Follow Up Recommendations  No OT follow up    Equipment Recommendations  None recommended by OT    Recommendations for Other Services      Precautions / Restrictions         Mobility Bed Mobility               General bed mobility comments: scooted bottom up in bed-no physical assist needed.  Transfers                 General transfer comment: not assessed                 Perception     Praxis      Cognition  Awake/Alert Behavior During Therapy: WFL for tasks assessed/performed Overall Cognitive Status: Within Functional Limits for tasks assessed                       Extremity/Trunk Assessment               Exercises Other Exercises Other Exercises: Pt performed fine motor coordination activities/exercises in session with right hand (writing, tying shoe, penny activity, and activity with paper towel/tissue). Handout provided. Discussed safety such as avoiding sharp objects and hot surfaces due to decreased sensation. Pt also took meds in session and OT had her picking up a couple of pills with Rt hand to work on coordination.   Shoulder Instructions       General Comments      Pertinent Vitals/ Pain       Pain Assessment: No/denies pain  Home Living                                          Prior Functioning/Environment              Frequency Min 2X/week     Progress Toward  Goals  OT Goals(current goals can now be found in the care plan section)  Progress towards OT goals: Progressing toward goals  Acute Rehab OT Goals Patient Stated Goal: not stated OT Goal Formulation: With patient Time For Goal Achievement: 03/08/15 Potential to Achieve Goals: Good ADL Goals Additional ADL Goal #1: Pt will independently perform HEP for RUE to increase fine motor and gross motor coordination.  Plan Discharge plan remains appropriate    Co-evaluation                 End of Session     Activity Tolerance Patient tolerated treatment well   Patient Left in bed;with family/visitor present   Nurse Communication          Time: 5277-8242 OT Time Calculation (min): 20 min  Charges: OT General Charges $OT Visit: 1 Procedure OT Treatments $Therapeutic Activity: 8-22 mins  Benito Mccreedy OTR/L [phone] 03/02/2015, 11:29 AM

## 2015-03-02 NOTE — Care Management (Signed)
1528 03-02-15 Outpatient Rehab Referral for Speech sent via EPIC to Neuro Rehab @  [address] No further needs from CM at this time. Bethena Roys, RN,BSN [phone].

## 2015-03-02 NOTE — Progress Notes (Signed)
Speech Pathology  Discussed pt with Case Manager. Given persistent dysarthria with no fruther PT/OT needs, recommend f/u with Outpatient SLP.  Herbie Baltimore, [place] CCC-SLP [phone]

## 2015-03-03 LAB — CARDIOLIPIN ANTIBODIES, IGG, IGM, IGA
ANTICARDIOLIPIN IGG: 29 GPL U/mL — AB (ref 0–14)
ANTICARDIOLIPIN IGM: 14 [MPL'U]/mL — AB (ref 0–12)

## 2015-03-03 NOTE — Consult Note (Signed)
   [facility] CM Inpatient Consult   03/03/2015  Brianna Simon 1950-09-04 [id number]   Thank you for this consult.   This patient is Not eligible for [facility] Care Management Services due to her insurance is not in the [place] Management contact for services.   Reason:  Not a beneficiary currently attributed to one of the [place].  Membership roster was used to verify non- eligible status. For questions, please contact: Natividad Brood, RN BSN [facility] Liaison  [phone] business mobile phone

## 2015-03-04 ENCOUNTER — Telehealth: Payer: Self-pay | Admitting: *Deleted

## 2015-03-04 NOTE — Telephone Encounter (Signed)
I spoke w/ pt's husband. While removing exterior bandage, steri-strips also came off due to exterior bandage adhesive. No active bleeding. Incision has dried blood. I instructed pt not to use neosporin or other OTC ointments. Instructed them to use a band-aid if desired.  They are aware of full wound check appt on 03/08/15.

## 2015-03-08 ENCOUNTER — Telehealth: Payer: Self-pay | Admitting: Cardiology

## 2015-03-08 ENCOUNTER — Ambulatory Visit (INDEPENDENT_AMBULATORY_CARE_PROVIDER_SITE_OTHER): Payer: 59 | Admitting: *Deleted

## 2015-03-08 DIAGNOSIS — I639 Cerebral infarction, unspecified: Secondary | ICD-10-CM

## 2015-03-08 LAB — CUP PACEART INCLINIC DEVICE CHECK: Date Time Interrogation Session: 20161024103905

## 2015-03-08 NOTE — Telephone Encounter (Addendum)
Needs to stay with Dr Curt Bears.  We are in same practice and that part needs to be addressed and fixed on billing end

## 2015-03-08 NOTE — Progress Notes (Signed)
ILR wound check appointment. Steri-strips removed by pt prior to arrival. Wound without redness or edema. Incision edges approximated, wound well healed. Normal device function. Pt with 0 tachy episodes; 0 brady episodes; 0 asystole. Patient educated about wound care, arm mobility, lifting restrictions. Carelink summary reports QMO & ROV w/ WC 06/07/15.

## 2015-03-08 NOTE — Telephone Encounter (Signed)
Husband is here with his wife for a wound check.   He stated that his wife needs to change her follow up appointment with Dr.Camnitz to Dr. Rayann Heman because their insurance is not showing Dr. Curt Bears in their system yet.   Spoke with Erline Levine in the billing department and she stated that information was sent to the insurance company back in August and she will check with a supervisor to see why he is not showing up.  Marzetta Board will gave Mr. Hemann a call later this week.  He instance that we change to Dr. Rayann Heman do to this situation.

## 2015-03-09 ENCOUNTER — Telehealth: Payer: Self-pay | Admitting: Neurology

## 2015-03-09 ENCOUNTER — Telehealth: Payer: Self-pay

## 2015-03-09 ENCOUNTER — Telehealth: Payer: Self-pay | Admitting: Family Medicine

## 2015-03-09 NOTE — Telephone Encounter (Signed)
Order has been faxed. Called and spoke to patient's husband he is aware.

## 2015-03-09 NOTE — Telephone Encounter (Signed)
Pt is asking for Speech Therapy Referral be sent to [facility]. Phone Number [phone], Fax [phone]. Point of Contact: Bevely Palmer May call pt at [phone] Charlestine Massed), husband

## 2015-03-09 NOTE — Telephone Encounter (Signed)
See below

## 2015-03-09 NOTE — Telephone Encounter (Signed)
Yes thanks 

## 2015-03-09 NOTE — Telephone Encounter (Signed)
I spoke to the patient's spouse, Inaya Gillham, in regards to the Respect-ESUS study. The patient and the patient's spouse are very interested in participating. Therefore, a screening visit was scheduled for [id number] at 13:00h.

## 2015-03-09 NOTE — Telephone Encounter (Signed)
Pt has been sch for 03-16-15

## 2015-03-09 NOTE — Telephone Encounter (Signed)
Pt was discharge from [place] on 03-02-15. Can I create 30 min slot soon?

## 2015-03-10 ENCOUNTER — Telehealth: Payer: Self-pay

## 2015-03-10 NOTE — Telephone Encounter (Signed)
I spoke to the patient's spouse in regards to the Respect-ESUS study. I let him know that a slot had become available for the patient to have the screening visit for the study. The patient's spouse declined the offer. Therefore, her original screening appointment date was left unchanged. ([id number]).

## 2015-03-11 ENCOUNTER — Telehealth: Payer: Self-pay | Admitting: *Deleted

## 2015-03-11 DIAGNOSIS — Z0289 Encounter for other administrative examinations: Secondary | ICD-10-CM

## 2015-03-11 NOTE — Telephone Encounter (Signed)
Rn talk to patients husband Liliane Channel known as Dorothyann Peng about his wife FMLA form. Pt is going to [facility] speech therapy. Rn stated that we dont have a order for occupational therapy. Pt husband who is on DPR stated patient was given exercises by the therapist in the hospital, and that she is doing Ot at home on her own. Rn gave pts husband the fax number and a letter from [facility] therapy. Rn explain we have 10 days to do any form for jobs or companies.

## 2015-03-11 NOTE — Telephone Encounter (Signed)
Form,FMLA [employer] received from [place] to [place] and Dr Theresia Lo 03/11/15.

## 2015-03-16 ENCOUNTER — Ambulatory Visit (INDEPENDENT_AMBULATORY_CARE_PROVIDER_SITE_OTHER): Payer: 59 | Admitting: Family Medicine

## 2015-03-16 VITALS — BP 128/76 | HR 68 | Temp 98.5°F | Wt 148.0 lb

## 2015-03-16 DIAGNOSIS — I10 Essential (primary) hypertension: Secondary | ICD-10-CM | POA: Diagnosis not present

## 2015-03-16 DIAGNOSIS — I639 Cerebral infarction, unspecified: Secondary | ICD-10-CM | POA: Diagnosis not present

## 2015-03-16 DIAGNOSIS — E785 Hyperlipidemia, unspecified: Secondary | ICD-10-CM

## 2015-03-16 NOTE — Progress Notes (Signed)
Garret Reddish, MD  Subjective:  Brianna Simon is a 64 y.o. year old very pleasant female patient who presents for/with See problem oriented charting ROS- No chest pain or shortness of breath. No headache or blurry vision. No extremity weakness or paresthesias other than right hand.   Past Medical History-  Patient Active Problem List   Diagnosis Date Noted  . History of CVA (cerebrovascular accident) 02/22/2010    Priority: High  . Former smoker 12/09/2014    Priority: Medium  . Anxiety state 10/27/2014    Priority: Medium  . Hyperlipidemia 02/22/2010    Priority: Medium  . Essential hypertension 12/07/2006    Priority: Medium  . Allergic rhinitis 10/27/2014    Priority: Low  . Esophageal reflux 12/31/2006    Priority: Low  . Insomnia 12/31/2006    Priority: Low  . MVP (mitral valve prolapse) 12/10/2006    Priority: Low  . Dysarthria 03/02/2015  . Hypokalemia 03/02/2015  . Cryptogenic stroke ([place]) 03/01/2015    Medications- reviewed and updated Current Outpatient Prescriptions  Medication Sig Dispense Refill  . aspirin 325 MG tablet Take 1 tablet (325 mg total) by mouth daily. 30 tablet 12  . atorvastatin (LIPITOR) 40 MG tablet Take 1 tablet (40 mg total) by mouth daily at 6 PM. 30 tablet 2  . cetirizine (ZYRTEC) 10 MG tablet Take 10 mg by mouth daily.    . fluticasone (FLONASE) 50 MCG/ACT nasal spray Place 2 sprays into both nostrils daily.  6  . hydrochlorothiazide (HYDRODIURIL) 25 MG tablet Take 0.5 tablets (12.5 mg total) by mouth daily. 45 tablet 3  . Multiple Vitamin (MULTIVITAMIN WITH MINERALS) TABS tablet Take 1 tablet by mouth daily.    . pantoprazole (PROTONIX) 40 MG tablet Take 1 tablet (40 mg total) by mouth daily. 90 tablet 3  . sertraline (ZOLOFT) 100 MG tablet TAKE 1 TABLET BY MOUTH DAILY 90 tablet 3  . VITAMIN D, CHOLECALCIFEROL, PO Take 1 tablet by mouth daily.      Objective: BP 128/76 mmHg  Pulse 68  Temp(Src) 98.5 F (36.9 C)  Wt 148 lb  (67.132 kg) Gen: NAD, resting comfortably CV: RRR no murmurs rubs or gallops Lungs: CTAB no crackles, wheeze, rhonchi Abdomen: soft/nontender/nondistended/normal bowel sounds. No rebound or guarding.  Ext: no edema Skin: warm, dry Neuro: CN II-XII - I do not note the facial droop on right previously noted. Mild dysarthria. sensation and reflexes normal throughout, 5/5 muscle strength in bilateral upper and lower extremities with exception 5-/5 right grip. Normal finger to nose. Normal rapid alternating movements. Normal gait. Romberg normal.   Assessment/Plan:  Cryptogenic stroke [facility]) S: Patient admitted from 02/26/15 to 03/02/15. Presented with dysarthria and weakened right grip while on her female route. CT showed no acute process. Patient had tPA administered. She has a history of stroke with right grip strength mildly diminished as residual deficit. Patient had been compliant with her aspirin. Admitted to neuro ICU. Felt to be embolic and likely due to afib due to history of sudden on and off palpitations. MRI showed L precentral cortex infarct. MRA unremarkable. Carotid dopplers without stenosis. Echo with no source of embolus. 3 year loop reocrder placed. LDLD was 86 so statin intensified. A1c was 5.5. Hypercoagulable work up was initiated and is to be followed by neurology. Unclear results on anticardiolipin- which will be interpreted by Dr. Leonie Man. Patients dysarthria is improving daily. She is working with ST.   A/P: Recheck lipids at follow up with goal <70.  BP at goal. On aspirin currently full 325- continue. She will be placed into study which she reports may place her on pradaxa- we will await further info from neurology. In addition, would ask for Dr. Clydene Fake aid in interpreting anticardiolipin studies in this context.     Hyperlipidemia S: prior Simvastatin 20mg --> atorvastatin 40mg  for LDL in hospital of 86. Goal <70 A/P: we discussed not enough time to know effect of statin- repeat  next visit at least direct ldl   Essential hypertension S: controlled. On   HCTZ 12.5mg  BP Readings from Last 3 Encounters:  03/16/15 128/76  03/02/15 133/65  12/21/14 130/82  A/P:Continue current meds:  Given 2nd stroke- we are really going to aim for <865 systolic .   Return precautions advised. See avs.

## 2015-03-16 NOTE — Telephone Encounter (Signed)
Correction made on previous note on 03-16-15. PT needs FMLA forms not research forms. Pt has research appt at [facility].[name] Form fax to  [place] [employer] for patient. Fax receive and confirm.

## 2015-03-16 NOTE — Patient Instructions (Addendum)
Continue with current medications: no change  Consider rechecking potassium at follow up- suspset this was low due to all the fluid you received  Thankful we have the loop recorder in place and follow up in neurology study

## 2015-03-16 NOTE — Telephone Encounter (Signed)
Rn call Liliane Channel patients husband on the [facility] form. Pt was seen in the hospital by Dr. Leonie Man from 02-26-15 to 03-02-15. Pt has a research appt on 03-23-15 with Dr.Sethi. Her follow up is in January 2017. Pt will be attending her research appt next week. Rn explain that she has to attend the research study appt in order to complete the research forms.  Pts wife Liliane Channel understands about the process of the form.

## 2015-03-17 ENCOUNTER — Encounter: Payer: Self-pay | Admitting: Family Medicine

## 2015-03-17 NOTE — Assessment & Plan Note (Signed)
S: Patient admitted from 02/26/15 to 03/02/15. Presented with dysarthria and weakened right grip while on her female route. CT showed no acute process. Patient had tPA administered. She has a history of stroke with right grip strength mildly diminished as residual deficit. Patient had been compliant with her aspirin. Admitted to neuro ICU. Felt to be embolic and likely due to afib due to history of sudden on and off palpitations. MRI showed L precentral cortex infarct. MRA unremarkable. Carotid dopplers without stenosis. Echo with no source of embolus. 3 year loop reocrder placed. LDLD was 86 so statin intensified. A1c was 5.5. Hypercoagulable work up was initiated and is to be followed by neurology. Unclear results on anticardiolipin- which will be interpreted by Dr. Leonie Man. Patients dysarthria is improving daily. She is working with ST.   A/P: Recheck lipids at follow up with goal <70. BP at goal. On aspirin currently full 325- continue. She will be placed into study which she reports may place her on pradaxa- we will await further info from neurology. In addition, would ask for Dr. Clydene Fake aid in interpreting anticardiolipin studies in this context.

## 2015-03-17 NOTE — Assessment & Plan Note (Signed)
S: prior Simvastatin 20mg --> atorvastatin 40mg  for LDL in hospital of 86. Goal <70 A/P: we discussed not enough time to know effect of statin- repeat next visit at least direct ldl

## 2015-03-17 NOTE — Assessment & Plan Note (Signed)
S: controlled. On   HCTZ 12.5mg  BP Readings from Last 3 Encounters:  03/16/15 128/76  03/02/15 133/65  12/21/14 130/82  A/P:Continue current meds:  Given 2nd stroke- we are really going to aim for <741 systolic .

## 2015-03-23 ENCOUNTER — Ambulatory Visit (INDEPENDENT_AMBULATORY_CARE_PROVIDER_SITE_OTHER): Payer: Self-pay | Admitting: Neurology

## 2015-03-23 DIAGNOSIS — I639 Cerebral infarction, unspecified: Secondary | ICD-10-CM

## 2015-03-23 NOTE — Progress Notes (Signed)
RESPECT ESUS STUDY SCREENING VISIT NOTE  29 year Caucasian lady with remote small left pareital embolic infarct in January 2011 without definite identified source of embolism.Vascular risk factors of Hypertension and Hyperlipidimia. Recent left precentral cortical embolic infarcts of cryptogenic etiology  In October 2016. Patient has a loop recorder inserted but so far atrial fibrillation has not been found. LDL cholesterol is 86. Malignancy was 5.5. Hypercoagulable workup was all negative. TEE done in 2011 intracranial MRA and extracranial carotid Dopplers did not show significant stenosis on the left side   Patient is seen today for the respect cases studies crit screening visit. She states she has noticed improvement in her speech difficulties and can speak quite well but still has some diminished fine motor skills in the right hand. She is tolerating aspirin well without bleeding bruising or other side effects. PHYSICAL EXAM :  . Afebrile. Head is nontraumatic. Neck is supple without bruit.    Cardiac exam no murmur or gallop. Lungs are clear to auscultation. Distal pulses are well felt.  Neurological Exam ;  Awake  Alert oriented x 3. Normal speech and language.eye movements full without nystagmus.fundi were not visualized. Vision acuity and fields appear normal. Hearing is normal. Palatal movements are normal. Face symmetric. Tongue midline. Normal strength, tone, reflexes and coordination.  Diminished fine finger movements on the right. Orbits left over right upper extremity. Symmetric grip strength.Normal sensation. Gait normal including tandem walking.  NIHSS 0. Modified Rankin score 1  IMPRESSION :  64 year old lady with cryptogenic left MCA branch infarct in October 2016 as well as prior left parietal infarct in 2011. Vascular risk factors of hypertension, hyperlipidemia PLAN : . I had a long discussion with the patient and husband regarding the RESPECT ESUS trial. Patient and husband were  given opportunity to ask questions which were answered. I went over personally inclusion and exclusion criteria and patient meets all. She states she had some skin lesions removed which was precancerous. We will plan to get records from the dermatologist to confirm this. Patient will continue aspirin and will be randomized into the study he if she meets all inclusion and exclusion criteria.  she will follow-up in the future as per study protocol.  Antony Contras, MD

## 2015-03-25 ENCOUNTER — Telehealth: Payer: Self-pay

## 2015-03-25 NOTE — Telephone Encounter (Signed)
Spoke to pts husband . Told him we have received dermatology notes. Pt will be seen for randomization into r eusu trial as planned.

## 2015-04-01 ENCOUNTER — Telehealth: Payer: Self-pay | Admitting: Cardiology

## 2015-04-01 ENCOUNTER — Ambulatory Visit (INDEPENDENT_AMBULATORY_CARE_PROVIDER_SITE_OTHER): Payer: 59 | Admitting: *Deleted

## 2015-04-01 DIAGNOSIS — I639 Cerebral infarction, unspecified: Secondary | ICD-10-CM

## 2015-04-01 NOTE — Telephone Encounter (Signed)
Spoke w/ pt husband and explained to him that the monthly summary reports are automatic. Pt husband verbalized understanding.

## 2015-04-01 NOTE — Telephone Encounter (Signed)
New problem   Pt's spouse need call back with assistance on remote check b/c he stated no one explained it to them. Please call pt's spouse.

## 2015-04-21 ENCOUNTER — Encounter: Payer: Self-pay | Admitting: Cardiology

## 2015-04-26 DIAGNOSIS — Z0271 Encounter for disability determination: Secondary | ICD-10-CM

## 2015-05-03 ENCOUNTER — Ambulatory Visit (INDEPENDENT_AMBULATORY_CARE_PROVIDER_SITE_OTHER): Payer: 59 | Admitting: *Deleted

## 2015-05-03 DIAGNOSIS — I639 Cerebral infarction, unspecified: Secondary | ICD-10-CM

## 2015-05-03 LAB — CUP PACEART REMOTE DEVICE CHECK: Date Time Interrogation Session: 20161117120525

## 2015-05-03 NOTE — Progress Notes (Signed)
Carelink Summary Report 

## 2015-05-04 NOTE — Progress Notes (Signed)
Carelink Summary Report / Loop Recorder 

## 2015-05-19 ENCOUNTER — Encounter: Payer: Self-pay | Admitting: Neurology

## 2015-05-19 ENCOUNTER — Ambulatory Visit (INDEPENDENT_AMBULATORY_CARE_PROVIDER_SITE_OTHER): Payer: 59 | Admitting: Neurology

## 2015-05-19 VITALS — BP 120/70 | HR 61 | Ht 61.0 in | Wt 155.6 lb

## 2015-05-19 DIAGNOSIS — I639 Cerebral infarction, unspecified: Secondary | ICD-10-CM | POA: Diagnosis not present

## 2015-05-19 NOTE — Patient Instructions (Signed)
I had a long d/w patient and husband about her recent stroke, risk for recurrent stroke/TIAs, personally independently reviewed imaging studies and stroke evaluation results and answered questions.Continue RESPECT ESUS study medication ( aspirin versus Pradaxa) for secondary stroke prevention and maintain strict control of hypertension with blood pressure goal below 130/90, diabetes with hemoglobin A1c goal below 6.5% and lipids with LDL cholesterol goal below 70 mg/dL. I also advised the patient to eat a healthy diet with plenty of whole grains, cereals, fruits and vegetables, exercise regularly and maintain ideal body weight. Followup in the future with me in the future as per RESPECT ESUS study protocol

## 2015-05-19 NOTE — Progress Notes (Signed)
[facility] [address] [place]. [place] [address] [phone]       OFFICE FOLLOW-UP NOTE  [name] Date of Birth:  1950-11-22 Medical Record Number:  [id number]   HPI: 89 year Caucasian lady with remote small left pareital embolic infarct in January 2011 without definite identified source of embolism.Vascular risk factors of Hypertension and Hyperlipidimia. Mild chronic lower extremities paresthesias  Update 02/10/2013  She returns for f/u after last visit on 01/16/2012.She remains stable without any new stroke or TIA signs and symptoms.She is tolerating aspirin well without side effects and BP is well controlled and last lipid profile in February this year was fine. She has mild paresthesias in her feet which appear unchanged from last visit.She has minor nasal bleeds related to her sinuses after sneezing but no unprovoked bleeding.Shehas intermittent occasional blurred vision and dizziness without vertigo. UPDATE 02/09/2014 : She returns for followup of the last visit 1 year ago. She continues to do well and has remained stroke symptom free since January 2011. She continues to take aspirin and states her blood pressure is under good control though it is slightly elevated at 148/79 in office today. She had her last lipid profile checked less than a year ago and was fine. She is tolerating her medications well without any side effects. She had it last carotid ultrasound and also a year ago. She had no new complaints today. 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 Patient had a loop recorder inserted but so far atrial fibrillation has not been found. LDL cholesterol is 86. HbA1c was 5.5. Hypercoagulable workup was all negative. TEE done in 2011 intracranial MRA and extracranial  carotid Dopplers did not show significant stenosis on the left side. She is tolerating the  RESPECT ESUSstudy medications without any significant bruising, bleeding or other side effects. Blood pressure is well controlled and it is 120/70 today. She is tolerating Lipitor without myalgias or arthralgias or other side effects. She continues to have occasional speech difficulties as well as tingling and numbness in the right side of the face and tongue as well as diminished taste. She is currently doing occupational therapy for her right hand. She's had trouble sleeping with some insomnia for sleep onset but 1 she sleep she can sleep 7-8 hours. Is requesting medication to help her sleep earlier. ROS:   14 system review of systems is positive for  fatigue, hearing loss, ringing in the ears, light sensitivity, easy bruising and bleeding, memory loss, numbness, speech difficulty, depression, anxiety, new skin moles, itching, snoring, insomnia and all other systems negative. PMH:  Past Medical History  Diagnosis Date  . Hypertension   . Mitral valve prolapse     does not cause patient any problems  . Hx: UTI (urinary tract infection)   . Anxiety   . GERD (gastroesophageal reflux disease)   . Hyperlipidemia   . Stroke [facility] 2011    mild  . Seasonal allergies   . Arthritis     hips, hands, lower back - otc med prn  . SVD (spontaneous vaginal delivery)     x 1 - gave up for adoption  . Cancer ([place])     skin cancer left wrist and left chest    Social History:  Social History   Social History  . Marital Status:  Married    Spouse Name: N/A  . Number of Children: N/A  . Years of Education: N/A   Occupational History  . [place] [employer]   Social History Main Topics  . Smoking status: Former Smoker -- 2.00 packs/day for 30 years    Types: Cigarettes    Quit date: 03/17/2003  . Smokeless tobacco: Never Used  . Alcohol Use: 1.8 oz/week    1 Glasses of wine, 1 Cans of beer, 1  Shots of liquor per week     Comment: occasionally  . Drug Use: No  . Sexual Activity: Yes    Birth Control/ Protection: Post-menopausal   Other Topics Concern  . Not on file   Social History Narrative   Caffeine Use: daily    Medications:   Current Outpatient Prescriptions on File Prior to Visit  Medication Sig Dispense Refill  . aspirin 325 MG tablet Take 1 tablet (325 mg total) by mouth daily. 30 tablet 12  . atorvastatin (LIPITOR) 40 MG tablet Take 1 tablet (40 mg total) by mouth daily at 6 PM. 30 tablet 2  . cetirizine (ZYRTEC) 10 MG tablet Take 10 mg by mouth daily.    . fluticasone (FLONASE) 50 MCG/ACT nasal spray Place 2 sprays into both nostrils daily.  6  . hydrochlorothiazide (HYDRODIURIL) 25 MG tablet Take 0.5 tablets (12.5 mg total) by mouth daily. 45 tablet 3  . Multiple Vitamin (MULTIVITAMIN WITH MINERALS) TABS tablet Take 1 tablet by mouth daily.    . pantoprazole (PROTONIX) 40 MG tablet Take 1 tablet (40 mg total) by mouth daily. 90 tablet 3  . sertraline (ZOLOFT) 100 MG tablet TAKE 1 TABLET BY MOUTH DAILY 90 tablet 3  . VITAMIN D, CHOLECALCIFEROL, PO Take 1 tablet by mouth daily.      No current facility-administered medications on file prior to visit.    Allergies:   Allergies  Allergen Reactions  . Citalopram Hydrobromide     REACTION: blurred vision  . Codeine Phosphate     Liquid formulation only bothers her; tolerates percocet fine.  REACTION: nausea,vomiting  . Nitrofurantoin     REACTION: itching  . Nsaids Other (See Comments)    Elevated liver panel  . Sulfamethoxazole-Trimethoprim     REACTION: itching   Filed Vitals:   05/19/15 1315  BP: 120/70  Pulse: 61     Physical Exam General: well developed, well nourished, seated, in no evident distress Head: head normocephalic and atraumatic.   Neck: supple with no carotid or supraclavicular bruits Cardiovascular: regular rate and rhythm, no murmurs Musculoskeletal: no deformity Skin:  no  rash/petichiae Vascular:  Normal pulses all extremities Filed Vitals:   05/19/15 1315  BP: 120/70  Pulse: 61    Neurologic Exam Mental Status: Awake and fully alert. Oriented to place and time. Recent and remote memory intact. Attention span, concentration and fund of knowledge appropriate. Mood and affect appropriate.  Cranial Nerves: Fundoscopic exam not done. Pupils equal, briskly reactive to light. Extraocular movements full without nystagmus. Visual fields full to confrontation. Hearing intact. Facial sensation intact. Face, tongue, palate moves normally and symmetrically.  Motor: Normal bulk and tone. Normal strength in all tested extremity muscles. Sensory.: intact to touch and pinprick and vibratory sensation. Except for right upper and lower lip where it is diminished Coordination: Rapid alternating movements normal in all extremities. Finger-to-nose and heel-to-shin performed accurately bilaterally. Gait and Station: Arises from chair without difficulty. Stance is normal. Gait demonstrates normal stride length  and balance . Able to heel, toe and tandem walk without difficulty.  Reflexes: 1+ and symmetric. Toes downgoing.      ASSESSMENT: 35 year Caucasian lady with remote small left pareital embolic infarcts in January 2011 and October 2016 without definite identified source of embolism.Vascular risk factors of Hypertension and Hyperlipidimia.  Patient is participating in the RESPECT ESUS trial( aspirin versus Pradaxa)    PLAN: I had a long d/w patient and husband about her recent stroke, risk for recurrent stroke/TIAs, personally independently reviewed imaging studies and stroke evaluation results and answered questions.Continue RESPECT ESUS study medication ( aspirin versus Pradaxa) for secondary stroke prevention and maintain strict control of hypertension with blood pressure goal below 130/90, diabetes with hemoglobin A1c goal below 6.5% and lipids with LDL cholesterol goal  below 70 mg/dL. I also advised the patient to eat a healthy diet with plenty of whole grains, cereals, fruits and vegetables, exercise regularly and maintain ideal body weight. Followup in the future with me in the future as per RESPECT ESUS study protocol  Antony Contras, MD

## 2015-05-31 ENCOUNTER — Telehealth: Payer: Self-pay | Admitting: Family Medicine

## 2015-05-31 ENCOUNTER — Telehealth: Payer: Self-pay | Admitting: Neurology

## 2015-05-31 ENCOUNTER — Ambulatory Visit (INDEPENDENT_AMBULATORY_CARE_PROVIDER_SITE_OTHER): Payer: 59 | Admitting: *Deleted

## 2015-05-31 ENCOUNTER — Encounter: Payer: Self-pay | Admitting: Family Medicine

## 2015-05-31 ENCOUNTER — Ambulatory Visit (INDEPENDENT_AMBULATORY_CARE_PROVIDER_SITE_OTHER): Payer: 59 | Admitting: Family Medicine

## 2015-05-31 VITALS — BP 132/74 | HR 66 | Temp 98.1°F | Wt 153.0 lb

## 2015-05-31 DIAGNOSIS — I639 Cerebral infarction, unspecified: Secondary | ICD-10-CM | POA: Diagnosis not present

## 2015-05-31 DIAGNOSIS — B0239 Other herpes zoster eye disease: Secondary | ICD-10-CM | POA: Diagnosis not present

## 2015-05-31 DIAGNOSIS — R21 Rash and other nonspecific skin eruption: Secondary | ICD-10-CM | POA: Diagnosis not present

## 2015-05-31 MED ORDER — VALACYCLOVIR HCL 1 G PO TABS: 1000.0000 mg | ORAL_TABLET | Freq: Three times a day (TID) | ORAL | Status: DC

## 2015-05-31 MED ORDER — METHYLPREDNISOLONE ACETATE 80 MG/ML IJ SUSP
80.0000 mg | Freq: Once | INTRAMUSCULAR | Status: AC
  Administered 2015-05-31: 80 mg via INTRAMUSCULAR

## 2015-05-31 NOTE — Telephone Encounter (Signed)
Pt would like to see the doctor tomorrow for redness around the outside of the right eye.May I use a SDA [facility] TOMORROW 06/01/15

## 2015-05-31 NOTE — Telephone Encounter (Signed)
Patient called to advise she had a rash over her eye on Saturday 05/29/15, was evaluated for this and it was determined she has shingles, patient wants to make sure the shot (depo-medrol)  they gave her and the pills they prescribed (VALTREX) her won't interfere with PRADAXA.

## 2015-05-31 NOTE — Progress Notes (Signed)
Garret Reddish, MD  Subjective:  Brianna Simon is a 65 y.o. year old very pleasant female patient who presents for/with See problem oriented charting ROS- no blurry vision, no double vision, no eye pain, no fever, chills, fatigue  Past Medical History-  Patient Active Problem List   Diagnosis Date Noted  . History of CVA (cerebrovascular accident) 02/22/2010    Priority: High  . Former smoker 12/09/2014    Priority: Medium  . Anxiety state 10/27/2014    Priority: Medium  . Hyperlipidemia 02/22/2010    Priority: Medium  . Essential hypertension 12/07/2006    Priority: Medium  . Allergic rhinitis 10/27/2014    Priority: Low  . Esophageal reflux 12/31/2006    Priority: Low  . Insomnia 12/31/2006    Priority: Low  . MVP (mitral valve prolapse) 12/10/2006    Priority: Low  . Dysarthria 03/02/2015  . Hypokalemia 03/02/2015  . Cryptogenic stroke ([facility]) 03/01/2015    Medications- reviewed and updated Current Outpatient Prescriptions  Medication Sig Dispense Refill  . aspirin 325 MG tablet Take 1 tablet (325 mg total) by mouth daily. 30 tablet 12  . atorvastatin (LIPITOR) 40 MG tablet Take 1 tablet (40 mg total) by mouth daily at 6 PM. 30 tablet 2  . cetirizine (ZYRTEC) 10 MG tablet Take 10 mg by mouth daily.    . fluticasone (FLONASE) 50 MCG/ACT nasal spray Place 2 sprays into both nostrils daily.  6  . hydrochlorothiazide (HYDRODIURIL) 25 MG tablet Take 0.5 tablets (12.5 mg total) by mouth daily. 45 tablet 3  . Multiple Vitamin (MULTIVITAMIN WITH MINERALS) TABS tablet Take 1 tablet by mouth daily.    . pantoprazole (PROTONIX) 40 MG tablet Take 1 tablet (40 mg total) by mouth daily. 90 tablet 3  . sertraline (ZOLOFT) 100 MG tablet TAKE 1 TABLET BY MOUTH DAILY 90 tablet 3  . VITAMIN D, CHOLECALCIFEROL, PO Take 1 tablet by mouth daily.      Objective: BP 132/74 mmHg  Pulse 66  Temp(Src) 98.1 F (36.7 C)  Wt 153 lb (69.4 kg) Gen: NAD, resting comfortably Left eye and  eyelid normal. Right eyelid erythematous and swollen with several small white to clear areas concerning for vesicles. PERRLA, EOMI. No pain with eye movement. Small erythematous area to the right of above mentioned areas CV: RRR no murmurs rubs or gallops Lungs: CTAB no crackles, wheeze, rhonchi Abdomen: soft/nontender/nondistended/normal bowel sounds. No rebound or guarding.  Ext: no edema- no rash on nose or forehead Skin: warm, dry, rash as noted above     Assessment/Plan:  Rash and nonspecific skin eruption -  Herpes zoster dermatitis of eyelid - Plan: Ambulatory referral to Ophthalmology S:Saturday morning mainly on eyelid with 2 red spots mild swelling. Redness has increased and noted small white or clearspots. Then developed another red spot to right of eye as well with small white spot as well. Very mild itch. No pain unless area is palpated but that can be quite bothersome. Did have shingles vaccine in 2014. Does have history of issues with getting makeup in the eye. Has been wearing makeup recently until yesterday. No new makeup products. No new contacts with eye. No blurry vision or pain in the eye A/P: Given potential vesicles I am worried about shingles. 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 Urgent referral to optho within 24 hours. Within 72 hours of start- Start valtrex for 7 days. Other concern is contact dermatitis from her eye makeup- advised avoid all eye makeup until this  has resolved and also given depo-medrol 80mg  injection. Doubt preseptal cellulitis as afebrile and not rapidly expanding and no pain with eye movement.   Urgent Return precautions advised.   Orders Placed This Encounter  Procedures  . Ambulatory referral to Ophthalmology    Referral Priority:  Urgent    Referral Type:  Consultation    Referral Reason:  Specialty Services Required    Requested Specialty:  Ophthalmology    Number of Visits Requested:  1   Meds ordered this encounter  Medications   . valACYclovir (VALTREX) 1000 MG tablet    Sig: Take 1 tablet (1,000 mg total) by mouth 3 (three) times daily.    Dispense:  21 tablet    Refill:  0  . methylPREDNISolone acetate (DEPO-MEDROL) injection 80 mg    Sig:

## 2015-05-31 NOTE — Patient Instructions (Addendum)
Brianna Simon will give you depo- medrol 80mg  injection before you leave  Start valtrex for 7 days (anti viral)  Refer to optho urgently to be seen within 24 hours. I am more than happy to see you back to reevaluate if new or worsening symptoms or any questions after 7 days of treatment

## 2015-05-31 NOTE — Telephone Encounter (Signed)
ok 

## 2015-05-31 NOTE — Telephone Encounter (Signed)
Rn cal patient back the pradaxa interfering with her steroid shot and valtrex pills. Pt stated she has shingles in her left eye and is on medication for it. Pt is currently in a research study at [facility] because of a stoke, and they have her on Pradaxa right now per patient. Rn explain the pradaxa will not interfer because she was just given a injection for the shingles. Rn educated patient that if she was under anesthesia, having dental work done, or having surgery or procedure the pradaxa would have to be stop prior. PT stated the MD who gave he the steroid injection saw her med list. Pt verbalized understanding.

## 2015-05-31 NOTE — Progress Notes (Signed)
Carelink Summary Report / Loop Recorder 

## 2015-06-01 ENCOUNTER — Other Ambulatory Visit: Payer: Self-pay | Admitting: Neurology

## 2015-06-01 NOTE — Telephone Encounter (Addendum)
Pt's husband called requesting 90 day supply for atorvastatin (LIPITOR) 40 MG tablet . It will be cheaper. He is inquiring if she could get a year refill on RX also.

## 2015-06-02 MED ORDER — ATORVASTATIN CALCIUM 40 MG PO TABS: 40.0000 mg | ORAL_TABLET | Freq: Every day | ORAL | Status: DC

## 2015-06-02 NOTE — Telephone Encounter (Signed)
I was out of the office yesterday.  Request has been sent to provider for review.  Previously written by hospitalist Ms Miachel Roux.

## 2015-06-03 ENCOUNTER — Telehealth: Payer: Self-pay | Admitting: Cardiology

## 2015-06-03 NOTE — Telephone Encounter (Signed)
Brianna Simon requested Probation officer speak with patient's husband regarding his request for patient to switch from Dr Curt Bears to Dr Rayann Heman because Mr Carrano is concerned Dr Curt Bears will not be covered by their insurance.  All efforts have been made to confirm that [facility] Falling will cover services for Dr Curt Bears per patients benefit structure. Stacy offered to transfer Mr Annett to Probation officer to address his concerns regarding Dr Curt Bears credentialing, Mr Brei declined.  Writer contacted [facility], per [employer] and claims coverage is under the practice name [facility] or [employer] (using the tax ID#).  Call back to Mr Widdowson to provide information received from [place], he requested time to call Holland Falling himself to confirm.  Mr Hogenson returned my call to state he had confirmed [facility] is in network.  He requested another appt. for Monday 06/07/15, appointment scheduled for 12:00 on Monday 06/07/15.  Mr Fasolino accepted and confirmed the appt.

## 2015-06-03 NOTE — Telephone Encounter (Signed)
Pt's husband calling re insurance not covering [facility] as he , according to him, is not credentialed with [employer]. Our credentialing dept states he is as they already paid for her implant and fu visit. Husband doesn't believe this to be true and refuses to see Camntiz but will see Allred who  insurance shows is credentialed, pt's husband stated to [facility] in billing that Elida is new and Dr. Rayann Heman has been here 16 years, so that maybe playing a part in why they want to change. He states he will see Camnitz Monday if there is no charge, but has cancelled it until he hears back from [place]. Wants to change to Allred if there is a charge. Pls advise [phone], wants call asap.

## 2015-06-03 NOTE — Telephone Encounter (Signed)
Patient had extensive conversation with Rayna Sexton (billing dept) this am.  We will be following up with credentialing, making sure Dr Curt Bears is in network with [employer].  Patient was informed that 2016 charges by Dr Curt Bears were paid by [facility].

## 2015-06-03 NOTE — Telephone Encounter (Signed)
Will forward to Billing

## 2015-06-03 NOTE — Telephone Encounter (Signed)
Pt states Dr. Curt Bears is not on her insurance yet but Allred is, she has a 3 month loop fu appt with Camnitz Monday, is there a charge for this? If so is it ok to switch her to Allred?

## 2015-06-04 ENCOUNTER — Telehealth: Payer: Self-pay | Admitting: Cardiology

## 2015-06-04 NOTE — Telephone Encounter (Signed)
New message     On 1.19.2017 after 5:00 pm  Left message on home voice mail for patient husband Audry Pili per request from clinical manager to contact me to discuss incident that happen Left my direct phone number and my personal cell phone number    On  1.20.2017 @ 11:31 am  left message on home voice mail for Audry Pili to call me back @ [phone] to discuss incident that happen on 1/19.

## 2015-06-04 NOTE — Telephone Encounter (Signed)
Pt call was returned by Georgana Curio, new telephone note opened

## 2015-06-07 ENCOUNTER — Encounter: Payer: Self-pay | Admitting: Cardiology

## 2015-06-07 ENCOUNTER — Encounter: Payer: 59 | Admitting: Cardiology

## 2015-06-07 ENCOUNTER — Ambulatory Visit (INDEPENDENT_AMBULATORY_CARE_PROVIDER_SITE_OTHER): Payer: 59 | Admitting: Cardiology

## 2015-06-07 VITALS — BP 152/80 | HR 70 | Ht 61.0 in | Wt 151.8 lb

## 2015-06-07 DIAGNOSIS — I639 Cerebral infarction, unspecified: Secondary | ICD-10-CM | POA: Diagnosis not present

## 2015-06-07 LAB — CUP PACEART INCLINIC DEVICE CHECK: MDC IDC SESS DTM: 20170123133304

## 2015-06-07 NOTE — Patient Instructions (Signed)
Medication Instructions:  Your physician recommends that you continue on your current medications as directed. Please refer to the Current Medication list given to you today.  Labwork: None ordered  Testing/Procedures: None ordered  Follow-Up: No follow up is needed at this time with Dr. Camnitz.  He will see you on an as needed basis.  If you need a refill on your cardiac medications before your next appointment, please call your pharmacy.  Thank you for choosing [facility]!!   Zaide Mcclenahan, RN [phone]      

## 2015-06-07 NOTE — Progress Notes (Signed)
Electrophysiology Office Note   Date:  06/07/2015   ID:  Brianna, Simon 30-Jun-1950, MRN [id number]  PCP:  Garret Reddish, MD   Primary Electrophysiologist:  Chiyeko Ferre Meredith Leeds, MD    Chief Complaint  Patient presents with  . Pacemaker Check    loop recorder     History of Present Illness: Brianna Simon is a 65 y.o. female who presents today for electrophysiology evaluation.   She has a history of cryptogenic stroke and had a LINQ monitor placed in Oct 2016.  She was admitted on 02/26/2015 with acute onset of expressive aphasia. Imaging demonstrated acute infarct in the left frontal parietal cortex, s/p TPA. She has undergone workup for stroke including echocardiogram and carotid dopplers (1-39% stenosis bilaterally). she has been doing well with rehabilitation, although she does continue to have numbness in her hands the side of her face and in her palate. She has been exercising with walking at the [facility].she is tolerating this well and wants to increase her exercise regimen.   Today, she denies symptoms of palpitations, chest pain, shortness of breath, orthopnea, PND, lower extremity edema, claudication, dizziness, presyncope, syncope, bleeding, or neurologic sequela. The patient is tolerating medications without difficulties and is otherwise without complaint today.    Past Medical History  Diagnosis Date  . Hypertension   . Mitral valve prolapse     does not cause patient any problems  . Hx: UTI (urinary tract infection)   . Anxiety   . GERD (gastroesophageal reflux disease)   . Hyperlipidemia   . Stroke [facility]) 2011    mild  . Seasonal allergies   . Arthritis     hips, hands, lower back - otc med prn  . SVD (spontaneous vaginal delivery)     x 1 - gave up for adoption  . Cancer [facility])     skin cancer left wrist and left chest   Past Surgical History  Procedure Laterality Date  . Colonoscopy    . Sinus surgery with instatrak    . Lipoma removed     right shoulder  . Wisdom tooth extraction    . Dilatation & curettage/hysteroscopy with trueclear N/A 10/24/2013    Procedure: DILATATION & CURETTAGE/HYSTEROSCOPY WITH TRUCLEAR;  Surgeon: Logan Bores, MD;  Location: [facility];  Service: Gynecology;  Laterality: N/A;  1 1/2hrs OR time  . Ep implantable device N/A 03/02/2015    Procedure: Loop Recorder Insertion;  Surgeon: Deryk Bozman Meredith Leeds, MD;  Location: [facility];  Service: Cardiovascular;  Laterality: N/A;     Current Outpatient Prescriptions  Medication Sig Dispense Refill  . aspirin 325 MG tablet Take 1 tablet (325 mg total) by mouth daily. 30 tablet 12  . atorvastatin (LIPITOR) 40 MG tablet Take 1 tablet (40 mg total) by mouth daily at 6 PM. 90 tablet 2  . cetirizine (ZYRTEC) 10 MG tablet Take 10 mg by mouth daily.    . fluticasone (FLONASE) 50 MCG/ACT nasal spray Place 2 sprays into both nostrils daily.  6  . hydrochlorothiazide (HYDRODIURIL) 25 MG tablet Take 0.5 tablets (12.5 mg total) by mouth daily. 45 tablet 3  . Multiple Vitamin (MULTIVITAMIN WITH MINERALS) TABS tablet Take 1 tablet by mouth daily.    . pantoprazole (PROTONIX) 40 MG tablet Take 1 tablet (40 mg total) by mouth daily. 90 tablet 3  . sertraline (ZOLOFT) 100 MG tablet TAKE 1 TABLET BY MOUTH DAILY 90 tablet 3  . valACYclovir (VALTREX) 1000 MG tablet  Take 1 tablet (1,000 mg total) by mouth 3 (three) times daily. 21 tablet 0  . VITAMIN D, CHOLECALCIFEROL, PO Take 1 tablet by mouth daily.      No current facility-administered medications for this visit.    Allergies:   Citalopram hydrobromide; Codeine phosphate; Nitrofurantoin; Nsaids; and Sulfamethoxazole-trimethoprim   Social History:  The patient  reports that she quit smoking about 12 years ago. Her smoking use included Cigarettes. She has a 60 pack-year smoking history. She has never used smokeless tobacco. She reports that she drinks about 1.8 oz of alcohol per week. She reports that she does not  use illicit drugs.   Family History:  The patient's family history includes Arrhythmia (age of onset: 32) in her mother; Diverticulitis in her other; Stroke in her mother and paternal grandmother.    ROS:  Please see the history of present illness.   Otherwise, review of systems is positive for hearing loss, DOE, .   All other systems are reviewed and negative.    PHYSICAL EXAM: VS:  BP 152/80 mmHg  Pulse 70  Ht 5\' 1"  (1.549 m)  Wt 151 lb 12.8 oz (68.856 kg)  BMI 28.70 kg/m2 , BMI Body mass index is 28.7 kg/(m^2). GEN: Well nourished, well developed, in no acute distress HEENT: normal Neck: no JVD, carotid bruits, or masses Cardiac: RRR; no murmurs, rubs, or gallops,no edema  Respiratory:  clear to auscultation bilaterally, normal work of breathing GI: soft, nontender, nondistended, + BS MS: no deformity or atrophy Skin: warm and dry,  device pocket is well healed Neuro:  Strength and sensation are intact Psych: euthymic mood, full affect  EKG:  EKG was not ordered today.    Device interrogation is reviewed today in detail.  See PaceArt for details.  No atrial arrhythmias noted.   Recent Labs: 10/27/2014: TSH 0.694 02/26/2015: ALT 22 03/01/2015: BUN 12; Creatinine, Ser 0.78; Hemoglobin 14.0; Platelets 252; Potassium 3.3*; Sodium 137    Lipid Panel     Component Value Date/Time   CHOL 156 02/27/2015 0416   TRIG 81 02/27/2015 0416   HDL 54 02/27/2015 0416   CHOLHDL 2.9 02/27/2015 0416   VLDL 16 02/27/2015 0416   LDLCALC 86 02/27/2015 0416     Wt Readings from Last 3 Encounters:  06/07/15 151 lb 12.8 oz (68.856 kg)  05/31/15 153 lb (69.4 kg)  05/19/15 155 lb 9.6 oz (70.58 kg)      Other studies Reviewed: Additional studies/ records that were reviewed today include: TTE 02/27/15 Review of the above records today demonstrates: - Left ventricle: The cavity size was normal. Wall thickness was increased in a pattern of mild LVH. Systolic function was normal. The  estimated ejection fraction was in the range of 55% to 60%. Wall motion was normal; there were no regional wall motion abnormalities. Doppler parameters are consistent with abnormal left ventricular relaxation (grade 1 diastolic dysfunction). - Mitral valve: Calcified annulus. Mildly thickened leaflets . There was mild regurgitation.   ASSESSMENT AND PLAN:  1.  Cryptogenic stroke: Doing well without complaint.  Has had no episodes on her LINQ monitor.  With no atrial fibrillation, Lemoyne Scarpati not plan for anticoagulation at this time.  She has been instructed on how to use her monitor.  If she has symptoms, she Rebeccah Ivins call the clinic.  We Peace Jost see her back on a PRN basis. I have told her that it is okay to increase her activity level as she wants to exercise more.  Current medicines are reviewed at length with the patient today.   The patient does not have concerns regarding her medicines.  The following changes were made today:  none  Labs/ tests ordered today include:  No orders of the defined types were placed in this encounter.     Disposition:   FU with Damond Borchers PRN  Signed, Kathline Banbury Meredith Leeds, MD  06/07/2015 12:39 PM     [facility] [address] [place] [address] [phone] (office) [phone] (fax)

## 2015-06-08 ENCOUNTER — Other Ambulatory Visit: Payer: Self-pay

## 2015-06-08 NOTE — Patient Outreach (Signed)
Telephone call to patient to complete mRS. Score = 1.

## 2015-06-18 LAB — CUP PACEART REMOTE DEVICE CHECK: Date Time Interrogation Session: 20161217120526

## 2015-06-30 ENCOUNTER — Encounter (INDEPENDENT_AMBULATORY_CARE_PROVIDER_SITE_OTHER): Payer: Self-pay

## 2015-06-30 ENCOUNTER — Ambulatory Visit (INDEPENDENT_AMBULATORY_CARE_PROVIDER_SITE_OTHER): Payer: 59 | Admitting: *Deleted

## 2015-06-30 DIAGNOSIS — I639 Cerebral infarction, unspecified: Secondary | ICD-10-CM | POA: Diagnosis not present

## 2015-06-30 DIAGNOSIS — Z0289 Encounter for other administrative examinations: Secondary | ICD-10-CM

## 2015-06-30 NOTE — Progress Notes (Signed)
Carelink Summary Report / Loop Recorder 

## 2015-07-02 ENCOUNTER — Telehealth: Payer: Self-pay | Admitting: Family Medicine

## 2015-07-02 ENCOUNTER — Telehealth: Payer: Self-pay | Admitting: Cardiology

## 2015-07-02 NOTE — Telephone Encounter (Signed)
New Message  Pt husband calling to speak w/ Device clinic- pt husband wanted to discuss pt's monthly loop recorder check in relation to pt's qualification to stroke study. Please call back and discuss.

## 2015-07-02 NOTE — Telephone Encounter (Signed)
Only slot I see is 10:15 on March 3rd. i want to help but this is late in February already. I can see for an acute visit but not for physical (no 30 minute slots unless someone cancels). In addition does she have an insurance that provides physical? It depends on what we are able to cover if she can cancel the establish visit (that is a 30 minute block and we have not yet reviewed her full history0

## 2015-07-02 NOTE — Telephone Encounter (Signed)
LMTCB//sss 

## 2015-07-02 NOTE — Telephone Encounter (Signed)
Patient's husband (EC) called back and proceeded to explain to me in a very aggressive tone how he felt about his wife's ILR implant. EC voiced his frustration about how he and his wife were told that if they wanted to be a part of the [employer] then they needed the ILR implanted. He stated that he was not made aware of the monthly cost ($64.50/month). He voiced how upsetting this is being that he and his wife are on a fixed income. Patient requested that Dr.Camnitz give him a call and explain to him why the monthly bill was not mentioned. EC stated that he "wanted to see if Dr.Camnitz would lie to him because if he does then he's going to have to meet him face to face".  I asked if EC would like to speak to the practice manager about his concerns. EC declined because he said that "all the Environmental education officer would do is listen" and he didn't want to have to explain it again.  Will forward message to Monona and Trinidad Curet, RN.  EC is requesting a call back first thing on Monday morning and wants Dr.Camnitz to call Dr.Sethi about the matter before speaking with him.

## 2015-07-02 NOTE — Telephone Encounter (Signed)
See below

## 2015-07-02 NOTE — Telephone Encounter (Signed)
Husband states it is imperative that pt have an annual CPE ASAP so pt can continue to process her disilbilty request. Disibilty is on hold until she gets a Feb physical. Husband states it is vital because pt has not seen a dr in feb, ans social security advised him the best way and fastest way  Is to see her pcp asap for the physical.  Husband also advised pt's appointment in August (that is an establish appt,) to be cancelled, he called a cpe I do see you have see pt several times.. Is it ok to cancel that appointment also? Please advise

## 2015-07-05 ENCOUNTER — Telehealth: Payer: Self-pay | Admitting: Cardiology

## 2015-07-05 ENCOUNTER — Other Ambulatory Visit: Payer: Self-pay

## 2015-07-05 DIAGNOSIS — Z1231 Encounter for screening mammogram for malignant neoplasm of breast: Secondary | ICD-10-CM

## 2015-07-05 NOTE — Telephone Encounter (Signed)
Spoke with Suanne Marker in billing who tells me she spoke with patient on Friday about billing question. Spoke with patient's husband to see if he still wanted Camnitz to call him. Patient states he would like someone to call him (Sethi/Camnitz).  States he is not ok with paying monthly fee for this LINQ.  States that they were approached in the hospital by Dr Leonie Man & Curt Bears, together [facility] states that both MDs did not speak to the pt at the same time),  and informed that because of her unknown cause of stroke she needs to be enrolled in a stroke study.  He states that they were told everything in stroke study was covered. They live on limited income and this is A LOT of money to them.  They are not ok with paying for this as they were led to believe this would be covered under study. Patient informed that I would discuss with Dr. Curt Bears and have someone call him back with more information.  Discussed with Camnitz who would like me to reach out to Dr. Leonie Man to get his input on this issue. Called Dr Clydene Fake office, spoke with [place] at front desk, who would like for patient to call their office to discuss research study. Informed patient who is agreeable to calling their office to discuss. Will inform Dr Curt Bears that patient would still like for him to call him.

## 2015-07-05 NOTE — Telephone Encounter (Signed)
Advised husband of Dr Ansel Bong reply. Husband states is there anyway you can do a simple exam in Feb?  He just needs documentation pt Saw a physician in Feb.  He also states this is very important.  Also, would like to know when you could do a complete cpe? He is not interested in waiting to August. He wants to know the first available, Which I cannot do without your consent.

## 2015-07-05 NOTE — Telephone Encounter (Signed)
Offered Brianna Simon the first available CPE slot on 08/03/15. He states he will call and cancel if they find something sooner.  Pt has no acute issues, just needs a physical.

## 2015-07-05 NOTE — Telephone Encounter (Signed)
Per Dr. Yong Channel statement below, he CAN see a pt for an acute visit in February. They can schedule a CPE for whenever his next available CPE slot is.

## 2015-07-05 NOTE — Telephone Encounter (Signed)
Dr. Curt Bears called patient. Patient tells Dr Curt Bears that he needs a cooling down period before speaking with him, after speaking with Dr. Clydene Fake office. They agreed to speak tomorrow.  Dr. Ileana Ladd will call him tomorrow afternoon.

## 2015-07-05 NOTE — Telephone Encounter (Signed)
See below Kathy. 

## 2015-07-05 NOTE — Telephone Encounter (Signed)
New Message:  Pt's husband called in with more information about Brianna Simon and her loop recorder. Please f/u with him

## 2015-07-06 ENCOUNTER — Telehealth: Payer: Self-pay | Admitting: Neurology

## 2015-07-06 ENCOUNTER — Telehealth: Payer: Self-pay | Admitting: Cardiology

## 2015-07-06 NOTE — Telephone Encounter (Signed)
Patient called to request appointment this week with Dr. Leonie Man, advised Dr. Leonie Man is out of the office this week. Patient wants to talk to him about writing up something for Social Security Disability and she wants to talk to Dr. Leonie Man about some things. "There are some things that he doesn't know, that I need to talk to him about", will not disclose what these are.

## 2015-07-06 NOTE — Telephone Encounter (Signed)
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 Pt would like a appt with Dr.Sethi.Pt stated she would like to speak with him alone without her husband because he always interrupting her doing her office visit. Rn stated she was just seen in January 2017 for a follow up. Pt stated now she is off balance,and her memory is getting worse. From the last note none of these sympts were on the last office note. Rn explain Dr.Sethi does not have any appts now. Rn stated her can review her chart and give her a call back. Pt verbalized understanding.

## 2015-07-06 NOTE — Telephone Encounter (Signed)
Attempted to call the patients husband to discuss current issues.  He stated that he was on his way to speech therapy and would call the office tomorrow.  Will Curt Bears, MD 07/06/2015 4:01 PM

## 2015-07-08 ENCOUNTER — Telehealth: Payer: Self-pay | Admitting: Cardiology

## 2015-07-08 NOTE — Telephone Encounter (Signed)
Spoke with Dr. Curt Bears. He called and spoke with patient who placed him on hold and never picked back up. He called back and left message for patient informing him he would try and call back in the morning.

## 2015-07-08 NOTE — Telephone Encounter (Signed)
New message  Pt husband called states that he was upset that Dr. Curt Bears was unable to call on time. He is asking if someone can call him with a good time for Dr. Curt Bears to speak with him about his wifes surgery recently. Please call back as soon as possible

## 2015-07-09 NOTE — Telephone Encounter (Signed)
Dr. Curt Bears called patient at 1:58 p.m. and left message to call back

## 2015-07-12 NOTE — Telephone Encounter (Signed)
I called the patient and spoke to her about her disability from neurological standpoint and offered to fill out disability paperwork if she were to provide [place] with or write a letter and asked her to send me the details.

## 2015-07-13 NOTE — Telephone Encounter (Signed)
Dr. Curt Bears called pt's husband and left message asking him to call office and let us know when is a good time for Dr. Curt Bears to call him. He explained that he would do his best to call him back at requested time. (This is the second message Dr. Curt Bears has left since last Friday)

## 2015-07-19 LAB — CUP PACEART REMOTE DEVICE CHECK: MDC IDC SESS DTM: 20170116120544

## 2015-07-19 NOTE — Progress Notes (Signed)
Carelink summary report received. Battery status OK. Normal device function. No new symptom episodes, tachy episodes, brady, or pause episodes. No new AF episodes. Monthly summary reports and ROV/PRN 

## 2015-07-28 LAB — CUP PACEART REMOTE DEVICE CHECK: Date Time Interrogation Session: 20170215123623

## 2015-07-28 NOTE — Progress Notes (Signed)
Carelink summary report received. Battery status OK. Normal device function. No new symptom episodes, tachy episodes, brady, or pause episodes. No new AF episodes. Monthly summary reports and ROV/PRN 

## 2015-07-30 ENCOUNTER — Ambulatory Visit (INDEPENDENT_AMBULATORY_CARE_PROVIDER_SITE_OTHER): Payer: 59 | Admitting: *Deleted

## 2015-07-30 DIAGNOSIS — I639 Cerebral infarction, unspecified: Secondary | ICD-10-CM | POA: Diagnosis not present

## 2015-07-30 NOTE — Progress Notes (Signed)
Carelink Summary Report / Loop Recorder 

## 2015-08-03 ENCOUNTER — Encounter: Payer: Self-pay | Admitting: Family Medicine

## 2015-08-03 ENCOUNTER — Ambulatory Visit (INDEPENDENT_AMBULATORY_CARE_PROVIDER_SITE_OTHER): Payer: 59 | Admitting: Family Medicine

## 2015-08-03 VITALS — BP 120/72 | HR 74 | Temp 98.0°F | Ht 61.0 in | Wt 151.0 lb

## 2015-08-03 DIAGNOSIS — K219 Gastro-esophageal reflux disease without esophagitis: Secondary | ICD-10-CM

## 2015-08-03 DIAGNOSIS — Z0001 Encounter for general adult medical examination with abnormal findings: Secondary | ICD-10-CM | POA: Diagnosis not present

## 2015-08-03 DIAGNOSIS — E785 Hyperlipidemia, unspecified: Secondary | ICD-10-CM | POA: Diagnosis not present

## 2015-08-03 DIAGNOSIS — I10 Essential (primary) hypertension: Secondary | ICD-10-CM

## 2015-08-03 DIAGNOSIS — R6889 Other general symptoms and signs: Secondary | ICD-10-CM

## 2015-08-03 NOTE — Progress Notes (Signed)
Brianna Reddish, MD Phone: [phone]  Subjective:  Patient presents today for their annual physical. Chief complaint-noted.   See problem oriented charting- ROS- full  review of systems was completed and negative except for: residual stroke deficits including:Some memory issues- worse since most recent stroke Word searching, some balance issues, numbness in right face still, loss of taste right side, picking up objects on right at times   The following were reviewed and entered/updated in epic: Past Medical History  Diagnosis Date  . Hypertension   . Mitral valve prolapse     does not cause patient any problems  . Hx: UTI (urinary tract infection)   . Anxiety   . GERD (gastroesophageal reflux disease)   . Hyperlipidemia   . Stroke [facility]) 2011    mild  . Seasonal allergies   . Arthritis     hips, hands, lower back - otc med prn  . SVD (spontaneous vaginal delivery)     x 1 - gave up for adoption  . Cancer [facility])     skin cancer left wrist and left chest   Patient Active Problem List   Diagnosis Date Noted  . Cryptogenic stroke ([place]) 03/01/2015    Priority: High  . History of CVA (cerebrovascular accident) 02/22/2010    Priority: High  . Former smoker 12/09/2014    Priority: Medium  . Anxiety state 10/27/2014    Priority: Medium  . Hyperlipidemia 02/22/2010    Priority: Medium  . Essential hypertension 12/07/2006    Priority: Medium  . Allergic rhinitis 10/27/2014    Priority: Low  . Esophageal reflux 12/31/2006    Priority: Low  . Insomnia 12/31/2006    Priority: Low  . MVP (mitral valve prolapse) 12/10/2006    Priority: Low  . Dysarthria 03/02/2015  . Hypokalemia 03/02/2015   Past Surgical History  Procedure Laterality Date  . Colonoscopy    . Sinus surgery with instatrak    . Lipoma removed      right shoulder  . Wisdom tooth extraction    . Dilatation & curettage/hysteroscopy with trueclear N/A 10/24/2013    Procedure: DILATATION &  CURETTAGE/HYSTEROSCOPY WITH TRUCLEAR;  Surgeon: Logan Bores, MD;  Location: [facility];  Service: Gynecology;  Laterality: N/A;  1 1/2hrs OR time  . Ep implantable device N/A 03/02/2015    Procedure: Loop Recorder Insertion;  Surgeon: Will Meredith Leeds, MD;  Location: [facility];  Service: Cardiovascular;  Laterality: N/A;    Family History  Problem Relation Age of Onset  . Arrhythmia Mother 28    pacemaker   . Stroke Mother   . Diverticulitis Other   . Stroke Paternal Grandmother     Medications- reviewed and updated Current Outpatient Prescriptions  Medication Sig Dispense Refill  . cetirizine (ZYRTEC) 10 MG tablet Take 10 mg by mouth daily.    . fluticasone (FLONASE) 50 MCG/ACT nasal spray Place 2 sprays into both nostrils daily.  6  . hydrochlorothiazide (HYDRODIURIL) 25 MG tablet Take 0.5 tablets (12.5 mg total) by mouth daily. 45 tablet 3  . Investigational - Study Medication Taking either aspirin 100mg  daily or pradaxa 150mg  BID    . Multiple Vitamin (MULTIVITAMIN WITH MINERALS) TABS tablet Take 1 tablet by mouth daily.    . pantoprazole (PROTONIX) 40 MG tablet Take 1 tablet (40 mg total) by mouth daily. 90 tablet 3  . sertraline (ZOLOFT) 100 MG tablet TAKE 1 TABLET BY MOUTH DAILY 90 tablet 3  . valACYclovir (VALTREX) 1000 MG  tablet Take 1 tablet (1,000 mg total) by mouth 3 (three) times daily. 21 tablet 0  . VITAMIN D, CHOLECALCIFEROL, PO Take 1 tablet by mouth daily.     [name] atorvastatin (LIPITOR) 40 MG tablet Take 1 tablet (40 mg total) by mouth daily at 6 PM. 90 tablet 2   No current facility-administered medications for this visit.    Allergies-reviewed and updated Allergies  Allergen Reactions  . Citalopram Hydrobromide     REACTION: blurred vision  . Codeine Phosphate     Liquid formulation only bothers her; tolerates percocet fine.  REACTION: nausea,vomiting  . Nitrofurantoin     REACTION: itching  . Nsaids Other (See Comments)    Elevated liver  panel  . Sulfamethoxazole-Trimethoprim     REACTION: itching    Social History   Social History  . Marital Status: Married    Spouse Name: N/A  . Number of Children: N/A  . Years of Education: N/A   Occupational History  . [place] [employer]   Social History Main Topics  . Smoking status: Former Smoker -- 2.00 packs/day for 30 years    Types: Cigarettes    Quit date: 03/17/2003  . Smokeless tobacco: Never Used  . Alcohol Use: 1.8 oz/week    1 Glasses of wine, 1 Cans of beer, 1 Shots of liquor per week     Comment: occasionally  . Drug Use: No  . Sexual Activity: Yes    Birth Control/ Protection: Post-menopausal   Other Topics Concern  . None   Social History Narrative   Caffeine Use: daily    ROS--See HPI   Objective: BP 120/72 mmHg  Pulse 74  Temp(Src) 98 F (36.7 C)  Ht 5\' 1"  (1.549 m)  Wt 151 lb (68.493 kg)  BMI 28.55 kg/m2 Gen: NAD, resting comfortably HEENT: Mucous membranes are moist. Oropharynx normal Neck: no thyromegaly CV: RRR no murmurs rubs or gallops Lungs: CTAB no crackles, wheeze, rhonchi Abdomen: soft/nontender/nondistended/normal bowel sounds. No rebound or guarding.  Ext: no edema Skin: warm, dry Neuro: decreased sensation on right side of body to fine touch, mild facial droop on left, otherwise moves all extremities with 5/5 strength, PERRLA  Assessment/Plan:  65 y.o. female presenting for annual physical.  Health Maintenance counseling: 1. Anticipatory guidance: Patient counseled regarding regular dental exams, eye exams, wearing seatbelts.  2. Risk factor reduction:  Advised patient of need for regular exercise (walking 40 minutes 3x a week) and diet rich and fruits and vegetables to reduce risk of heart attack and stroke.  3. Immunizations/screenings/ancillary studies Immunization History  Administered Date(s) Administered  . Influenza Split 03/17/2011, 02/09/2012  . Influenza Whole 02/12/2008, 02/22/2010  .  Influenza,inj,Quad PF,36+ Mos 02/10/2013, 03/09/2014, 02/05/2015  . Tdap 10/27/2014  . Zoster 04/03/2013   Health Maintenance Due  Topic Date Due  . PAP SMEAR - obtain records 03/15/2014   4. Cervical cancer screening- has had within 3 years- will get copy 5. Breast cancer screening-  breast exam through GYN and mammogram scheduled in April with GYN as well 6. Colon cancer screening - 07/01/2007 reports normal with 10 year repeat 7. Skin cancer screening- sees dermatology every year  History CVA- in investigational study- not clear if on aspirin or pradaxa. Still doing speech therapy Former smoker- discuss lung cancer screening at future visit Anxiety controlled on zoloft  Discussed wearing flat shoes, using a cane (declines), no loose rugs- reducing fall risk  Will watch potassium with repeat today- previously  low on hctz  Esophageal reflux S: controlled on protonix 40mg  but may increase CVA risk A/P: we will trial zantac 150mg  BID- if not controlled can change to protonix 20mg  trial- lowest effective dose would be best   Hyperlipidemia S: poorly controlled on simvastatin 20mg  in labs below but transitioned to atorvastatin 40mg . No myalgias.  Lab Results  Component Value Date   CHOL 156 02/27/2015   HDL 54 02/27/2015   LDLCALC 86 02/27/2015   TRIG 81 02/27/2015   CHOLHDL 2.9 02/27/2015   A/P: check direct LDL today with goal <70    Essential hypertension S: controlled. On hctz 12.5mg   BP Readings from Last 3 Encounters:  08/03/15 120/72  06/07/15 152/80  05/31/15 132/74  A/P:Continue current meds:  Excellent control    Return in about 6 months (around 02/03/2016). Return precautions advised.   Orders Placed This Encounter  Procedures  . LDL Cholesterol, Direct  . Comprehensive metabolic panel  . CBC with Differential/Platelet    Meds ordered this encounter  Medications  . Investigational - Study Medication    Sig: Taking either aspirin 100mg  daily or  pradaxa 150mg  BID

## 2015-08-03 NOTE — Assessment & Plan Note (Signed)
S: controlled on protonix 40mg  but may increase CVA risk A/P: we will trial zantac 150mg  BID- if not controlled can change to protonix 20mg  trial- lowest effective dose would be best

## 2015-08-03 NOTE — Assessment & Plan Note (Signed)
S: controlled. On hctz 12.5mg   BP Readings from Last 3 Encounters:  08/03/15 120/72  06/07/15 152/80  05/31/15 132/74  A/P:Continue current meds:  Excellent control

## 2015-08-03 NOTE — Patient Instructions (Addendum)
Sign release of information at the check out desk for records of your last pap smear  I want you to trial zantac 150mg  twice a day - if you have worsening of reflux you may go back on the protonix- I would like to consider a lower dose at 20mg  though given potential stroke risk. If that doesn't work- we can continue the 40mg .   Labs before you go not fasting

## 2015-08-03 NOTE — Assessment & Plan Note (Signed)
S: poorly controlled on simvastatin 20mg  in labs below but transitioned to atorvastatin 40mg . No myalgias.  Lab Results  Component Value Date   CHOL 156 02/27/2015   HDL 54 02/27/2015   LDLCALC 86 02/27/2015   TRIG 81 02/27/2015   CHOLHDL 2.9 02/27/2015   A/P: check direct LDL today with goal <70

## 2015-08-04 ENCOUNTER — Other Ambulatory Visit: Payer: Self-pay | Admitting: Family Medicine

## 2015-08-04 ENCOUNTER — Encounter: Payer: Self-pay | Admitting: Family Medicine

## 2015-08-04 DIAGNOSIS — R7989 Other specified abnormal findings of blood chemistry: Secondary | ICD-10-CM

## 2015-08-04 DIAGNOSIS — R945 Abnormal results of liver function studies: Principal | ICD-10-CM

## 2015-08-04 LAB — CBC WITH DIFFERENTIAL/PLATELET
BASOS ABS: 0 10*3/uL (ref 0.0–0.1)
BASOS PCT: 0 % (ref 0–1)
EOS ABS: 0.2 10*3/uL (ref 0.0–0.7)
Eosinophils Relative: 3 % (ref 0–5)
HCT: 42.8 % (ref 36.0–46.0)
HEMOGLOBIN: 14.4 g/dL (ref 12.0–15.0)
LYMPHS PCT: 29 % (ref 12–46)
Lymphs Abs: 1.7 10*3/uL (ref 0.7–4.0)
MCH: 30.3 pg (ref 26.0–34.0)
MCHC: 33.6 g/dL (ref 30.0–36.0)
MCV: 90.1 fL (ref 78.0–100.0)
MPV: 9.9 fL (ref 8.6–12.4)
Monocytes Absolute: 0.5 10*3/uL (ref 0.1–1.0)
Monocytes Relative: 9 % (ref 3–12)
NEUTROS ABS: 3.5 10*3/uL (ref 1.7–7.7)
NEUTROS PCT: 59 % (ref 43–77)
PLATELETS: 356 10*3/uL (ref 150–400)
RBC: 4.75 MIL/uL (ref 3.87–5.11)
RDW: 14.3 % (ref 11.5–15.5)
WBC: 6 10*3/uL (ref 4.0–10.5)

## 2015-08-04 LAB — LDL CHOLESTEROL, DIRECT: LDL DIRECT: 67 mg/dL (ref <130)

## 2015-08-04 LAB — COMPREHENSIVE METABOLIC PANEL
ALK PHOS: 172 U/L — AB (ref 33–130)
ALT: 57 U/L — AB (ref 6–29)
AST: 36 U/L — ABNORMAL HIGH (ref 10–35)
Albumin: 3.9 g/dL (ref 3.6–5.1)
BUN: 10 mg/dL (ref 7–25)
CALCIUM: 9.2 mg/dL (ref 8.6–10.4)
CHLORIDE: 99 mmol/L (ref 98–110)
CO2: 28 mmol/L (ref 20–31)
Creat: 0.58 mg/dL (ref 0.50–0.99)
GLUCOSE: 79 mg/dL (ref 65–99)
POTASSIUM: 4.2 mmol/L (ref 3.5–5.3)
Sodium: 143 mmol/L (ref 135–146)
Total Bilirubin: 0.5 mg/dL (ref 0.2–1.2)
Total Protein: 6.6 g/dL (ref 6.1–8.1)

## 2015-08-17 ENCOUNTER — Other Ambulatory Visit (INDEPENDENT_AMBULATORY_CARE_PROVIDER_SITE_OTHER): Payer: 59

## 2015-08-17 DIAGNOSIS — R945 Abnormal results of liver function studies: Principal | ICD-10-CM

## 2015-08-17 DIAGNOSIS — R7989 Other specified abnormal findings of blood chemistry: Secondary | ICD-10-CM | POA: Diagnosis not present

## 2015-08-18 LAB — HEPATIC FUNCTION PANEL
ALK PHOS: 164 U/L — AB (ref 33–130)
ALT: 51 U/L — ABNORMAL HIGH (ref 6–29)
AST: 35 U/L (ref 10–35)
Albumin: 3.8 g/dL (ref 3.6–5.1)
BILIRUBIN INDIRECT: 0.3 mg/dL (ref 0.2–1.2)
BILIRUBIN TOTAL: 0.4 mg/dL (ref 0.2–1.2)
Bilirubin, Direct: 0.1 mg/dL (ref <=0.2)
TOTAL PROTEIN: 6.1 g/dL (ref 6.1–8.1)

## 2015-08-30 ENCOUNTER — Ambulatory Visit (INDEPENDENT_AMBULATORY_CARE_PROVIDER_SITE_OTHER): Payer: 59 | Admitting: *Deleted

## 2015-08-30 DIAGNOSIS — I639 Cerebral infarction, unspecified: Secondary | ICD-10-CM

## 2015-08-30 NOTE — Progress Notes (Signed)
Carelink Summary Report / Loop Recorder 

## 2015-09-07 ENCOUNTER — Ambulatory Visit
Admission: RE | Admit: 2015-09-07 | Discharge: 2015-09-07 | Disposition: A | Discharge status: Home / Self Care | Payer: 59 | Source: Ambulatory Visit

## 2015-09-07 DIAGNOSIS — Z1231 Encounter for screening mammogram for malignant neoplasm of breast: Secondary | ICD-10-CM

## 2015-09-27 ENCOUNTER — Encounter (INDEPENDENT_AMBULATORY_CARE_PROVIDER_SITE_OTHER): Payer: Self-pay

## 2015-09-27 DIAGNOSIS — Z0289 Encounter for other administrative examinations: Secondary | ICD-10-CM

## 2015-09-28 ENCOUNTER — Ambulatory Visit (INDEPENDENT_AMBULATORY_CARE_PROVIDER_SITE_OTHER): Payer: 59 | Admitting: *Deleted

## 2015-09-28 DIAGNOSIS — I639 Cerebral infarction, unspecified: Secondary | ICD-10-CM

## 2015-09-28 NOTE — Progress Notes (Signed)
Carelink Summary Report / Loop Recorder 

## 2015-09-29 LAB — CUP PACEART REMOTE DEVICE CHECK: MDC IDC SESS DTM: 20170516133550

## 2015-10-08 LAB — CUP PACEART REMOTE DEVICE CHECK: MDC IDC SESS DTM: 20170317130547

## 2015-10-11 LAB — CUP PACEART REMOTE DEVICE CHECK: Date Time Interrogation Session: 20170416130648

## 2015-10-11 NOTE — Progress Notes (Signed)
Carelink summary report received. Battery status OK. Normal device function. No new symptom episodes, tachy episodes, brady, or pause episodes. No new AF episodes. Monthly summary reports and ROV/PRN 

## 2015-10-28 ENCOUNTER — Ambulatory Visit (INDEPENDENT_AMBULATORY_CARE_PROVIDER_SITE_OTHER): Payer: 59 | Admitting: *Deleted

## 2015-10-28 DIAGNOSIS — I639 Cerebral infarction, unspecified: Secondary | ICD-10-CM

## 2015-10-28 NOTE — Progress Notes (Signed)
Carelink Summary Report / Loop Recorder 

## 2015-11-26 LAB — CUP PACEART REMOTE DEVICE CHECK: Date Time Interrogation Session: 20170615140655

## 2015-11-29 ENCOUNTER — Ambulatory Visit (INDEPENDENT_AMBULATORY_CARE_PROVIDER_SITE_OTHER): Payer: 59 | Admitting: *Deleted

## 2015-11-29 DIAGNOSIS — I639 Cerebral infarction, unspecified: Secondary | ICD-10-CM | POA: Diagnosis not present

## 2015-11-29 NOTE — Progress Notes (Signed)
Carelink Summary Report / Loop Recorder 

## 2015-12-20 ENCOUNTER — Encounter: Payer: Self-pay | Admitting: Family Medicine

## 2015-12-20 ENCOUNTER — Ambulatory Visit (INDEPENDENT_AMBULATORY_CARE_PROVIDER_SITE_OTHER): Payer: 59 | Admitting: Family Medicine

## 2015-12-20 VITALS — BP 134/78 | HR 66 | Temp 98.1°F | Wt 147.4 lb

## 2015-12-20 DIAGNOSIS — E785 Hyperlipidemia, unspecified: Secondary | ICD-10-CM

## 2015-12-20 DIAGNOSIS — K219 Gastro-esophageal reflux disease without esophagitis: Secondary | ICD-10-CM | POA: Diagnosis not present

## 2015-12-20 DIAGNOSIS — I10 Essential (primary) hypertension: Secondary | ICD-10-CM

## 2015-12-20 NOTE — Assessment & Plan Note (Signed)
S: transitioned to cimitedine 150mg  BID from protonix 40mg - tums for breakthrough about once a week A/P: overall doing well, continue current regimen

## 2015-12-20 NOTE — Progress Notes (Signed)
Pre visit review using our clinic review tool, if applicable. No additional management support is needed unless otherwise documented below in the visit note. 

## 2015-12-20 NOTE — Patient Instructions (Signed)
Labs before you leave  No changes to medication

## 2015-12-20 NOTE — Assessment & Plan Note (Signed)
S: controlled on HCTZ 12.5mg  BP Readings from Last 3 Encounters:  12/20/15 134/78  08/03/15 120/72  06/07/15 (!) 152/80  A/P:Continue current meds:  Doing well

## 2015-12-20 NOTE — Assessment & Plan Note (Signed)
S: improved controlled on atorvastatin 40mg  from simvastatin 20mg . No myalgias.  Lab Results  Component Value Date   CHOL 156 02/27/2015   HDL 54 02/27/2015   LDLCALC 86 02/27/2015   LDLDIRECT 67 08/03/2015   TRIG 81 02/27/2015   CHOLHDL 2.9 02/27/2015   A/P: continue current meds and LDL <70

## 2015-12-20 NOTE — Progress Notes (Signed)
Subjective:  Brianna Simon is a 65 y.o. year old very pleasant female patient who presents for/with See problem oriented charting ROS- Numbness right hand, mild speech issues, no headache or blurry vision. No extremity weakness..see any ROS included in HPI as well.   Past Medical History-  Patient Active Problem List   Diagnosis Date Noted  . Cryptogenic stroke ([place]) 03/01/2015    Priority: High  . History of CVA (cerebrovascular accident) 02/22/2010    Priority: High  . Former smoker 12/09/2014    Priority: Medium  . Anxiety state 10/27/2014    Priority: Medium  . Hyperlipidemia 02/22/2010    Priority: Medium  . Essential hypertension 12/07/2006    Priority: Medium  . Allergic rhinitis 10/27/2014    Priority: Low  . Esophageal reflux 12/31/2006    Priority: Low  . Insomnia 12/31/2006    Priority: Low  . MVP (mitral valve prolapse) 12/10/2006    Priority: Low  . Dysarthria 03/02/2015    Medications- reviewed and updated Current Outpatient Prescriptions  Medication Sig Dispense Refill  . atorvastatin (LIPITOR) 40 MG tablet Take 1 tablet (40 mg total) by mouth daily at 6 PM. 90 tablet 2  . cetirizine (ZYRTEC) 10 MG tablet Take 10 mg by mouth daily.    . Cimetidine (TAGAMET PO) Take 150 mg by mouth 2 (two) times daily.    . fluticasone (FLONASE) 50 MCG/ACT nasal spray Place 2 sprays into both nostrils daily.  6  . hydrochlorothiazide (HYDRODIURIL) 25 MG tablet Take 0.5 tablets (12.5 mg total) by mouth daily. 45 tablet 3  . Investigational - Study Medication Taking either aspirin 100mg  daily or pradaxa 150mg  BID    . Multiple Vitamin (MULTIVITAMIN WITH MINERALS) TABS tablet Take 1 tablet by mouth daily.    . sertraline (ZOLOFT) 100 MG tablet TAKE 1 TABLET BY MOUTH DAILY 90 tablet 3  . VITAMIN D, CHOLECALCIFEROL, PO Take 1 tablet by mouth daily.     . valACYclovir (VALTREX) 1000 MG tablet Take 1 tablet (1,000 mg total) by mouth 3 (three) times daily. (Patient not taking:  Reported on 12/20/2015) 21 tablet 0   No current facility-administered medications for this visit.     Objective: BP 134/78 (BP Location: Left Arm, Patient Position: Sitting, Cuff Size: Normal)   Pulse 66   Temp 98.1 F (36.7 C) (Oral)   Wt 147 lb 6.4 oz (66.9 kg)   SpO2 98%   BMI 27.85 kg/m  Gen: NAD, resting comfortably CV: RRR no murmurs rubs or gallops Lungs: CTAB no crackles, wheeze, rhonchi Ext: no edema Skin: warm, dry, no rash Neuro: grossly normal, moves all extremities, no obvious speech deficits. Mild decreased gross sensation to righ thand  Assessment/Plan:  Hyperlipidemia S: improved controlled on atorvastatin 40mg  from simvastatin 20mg . No myalgias.  Lab Results  Component Value Date   CHOL 156 02/27/2015   HDL 54 02/27/2015   LDLCALC 86 02/27/2015   LDLDIRECT 67 08/03/2015   TRIG 81 02/27/2015   CHOLHDL 2.9 02/27/2015   A/P: continue current meds and LDL <70  Esophageal reflux S: transitioned to cimitedine 150mg  BID from protonix 40mg - tums for breakthrough about once a week A/P: overall doing well, continue current regimen  Essential hypertension S: controlled on HCTZ 12.5mg  BP Readings from Last 3 Encounters:  12/20/15 134/78  08/03/15 120/72  06/07/15 (!) 152/80  A/P:Continue current meds:  Doing well  Lung cancer screening  Discussed- she will consider and check with insurance  LFT elevations- recheck today-  had been trending down  Return in about 8 months (around 08/19/2016) for physical.  Orders Placed This Encounter  Procedures  . CBC with Differential/Platelet  . Comprehensive metabolic panel    Meds ordered this encounter  Medications  . Cimetidine (TAGAMET PO)    Sig: Take 150 mg by mouth 2 (two) times daily.    Return precautions advised.  Garret Reddish, MD

## 2015-12-21 LAB — CBC WITH DIFFERENTIAL/PLATELET
BASOS ABS: 0 {cells}/uL (ref 0–200)
Basophils Relative: 0 %
EOS ABS: 159 {cells}/uL (ref 15–500)
Eosinophils Relative: 3 %
HCT: 43.2 % (ref 35.0–45.0)
Hemoglobin: 14.2 g/dL (ref 11.7–15.5)
LYMPHS PCT: 26 %
Lymphs Abs: 1378 cells/uL (ref 850–3900)
MCH: 29.6 pg (ref 27.0–33.0)
MCHC: 32.9 g/dL (ref 32.0–36.0)
MCV: 90.2 fL (ref 80.0–100.0)
MONOS PCT: 9 %
MPV: 9.8 fL (ref 7.5–12.5)
Monocytes Absolute: 477 cells/uL (ref 200–950)
NEUTROS PCT: 62 %
Neutro Abs: 3286 cells/uL (ref 1500–7800)
PLATELETS: 298 10*3/uL (ref 140–400)
RBC: 4.79 MIL/uL (ref 3.80–5.10)
RDW: 14 % (ref 11.0–15.0)
WBC: 5.3 10*3/uL (ref 3.8–10.8)

## 2015-12-21 LAB — COMPREHENSIVE METABOLIC PANEL
ALT: 27 U/L (ref 6–29)
AST: 19 U/L (ref 10–35)
Albumin: 3.8 g/dL (ref 3.6–5.1)
Alkaline Phosphatase: 145 U/L — ABNORMAL HIGH (ref 33–130)
BUN: 12 mg/dL (ref 7–25)
CHLORIDE: 104 mmol/L (ref 98–110)
CO2: 26 mmol/L (ref 20–31)
Calcium: 9.1 mg/dL (ref 8.6–10.4)
Creat: 0.63 mg/dL (ref 0.50–0.99)
Glucose, Bld: 95 mg/dL (ref 65–99)
POTASSIUM: 3.5 mmol/L (ref 3.5–5.3)
Sodium: 143 mmol/L (ref 135–146)
TOTAL PROTEIN: 6.2 g/dL (ref 6.1–8.1)
Total Bilirubin: 0.5 mg/dL (ref 0.2–1.2)

## 2015-12-21 LAB — CUP PACEART REMOTE DEVICE CHECK: MDC IDC SESS DTM: 20170715150640

## 2015-12-27 ENCOUNTER — Ambulatory Visit (INDEPENDENT_AMBULATORY_CARE_PROVIDER_SITE_OTHER): Payer: 59 | Admitting: *Deleted

## 2015-12-27 DIAGNOSIS — I639 Cerebral infarction, unspecified: Secondary | ICD-10-CM | POA: Diagnosis not present

## 2015-12-27 NOTE — Progress Notes (Signed)
Carelink Summary Report / Loop Recorder 

## 2016-01-11 ENCOUNTER — Other Ambulatory Visit: Payer: Self-pay | Admitting: Family Medicine

## 2016-01-11 DIAGNOSIS — F329 Major depressive disorder, single episode, unspecified: Secondary | ICD-10-CM

## 2016-01-11 DIAGNOSIS — F32A Depression, unspecified: Secondary | ICD-10-CM

## 2016-01-13 NOTE — Telephone Encounter (Signed)
Rx refill sent to pharmacy. 

## 2016-01-24 LAB — CUP PACEART REMOTE DEVICE CHECK: Date Time Interrogation Session: 20170814153713

## 2016-01-24 NOTE — Progress Notes (Signed)
Carelink summary report received. Battery status OK. Normal device function. Good histogram. No new symptom episodes, brady, or pause episodes. No new AF episodes. 1 tachy- previously addressed, ECG appears PAT, PVC noted. Monthly summary reports and ROV/PRN

## 2016-01-26 ENCOUNTER — Ambulatory Visit (INDEPENDENT_AMBULATORY_CARE_PROVIDER_SITE_OTHER): Payer: 59 | Admitting: *Deleted

## 2016-01-26 DIAGNOSIS — I639 Cerebral infarction, unspecified: Secondary | ICD-10-CM

## 2016-01-26 NOTE — Progress Notes (Signed)
Carelink Summary Report / Loop Recorder 

## 2016-02-19 LAB — CUP PACEART REMOTE DEVICE CHECK: MDC IDC SESS DTM: 20170913160809

## 2016-02-19 NOTE — Progress Notes (Signed)
Carelink summary report received. Battery status OK. Normal device function. No new symptom episodes, tachy episodes, brady, or pause episodes. No new AF episodes. Monthly summary reports and ROV/PRN 

## 2016-02-21 ENCOUNTER — Other Ambulatory Visit: Payer: Self-pay | Admitting: Neurology

## 2016-02-25 ENCOUNTER — Ambulatory Visit (INDEPENDENT_AMBULATORY_CARE_PROVIDER_SITE_OTHER): Payer: 59 | Admitting: *Deleted

## 2016-02-25 ENCOUNTER — Ambulatory Visit (INDEPENDENT_AMBULATORY_CARE_PROVIDER_SITE_OTHER): Payer: 59 | Admitting: Family Medicine

## 2016-02-25 DIAGNOSIS — I639 Cerebral infarction, unspecified: Secondary | ICD-10-CM

## 2016-02-25 DIAGNOSIS — Z23 Encounter for immunization: Secondary | ICD-10-CM

## 2016-02-25 NOTE — Progress Notes (Signed)
Carelink Summary Report / Loop Recorder 

## 2016-02-29 ENCOUNTER — Other Ambulatory Visit: Payer: Self-pay

## 2016-02-29 ENCOUNTER — Telehealth: Payer: Self-pay | Admitting: Neurology

## 2016-02-29 MED ORDER — ATORVASTATIN CALCIUM 40 MG PO TABS: 40.0000 mg | ORAL_TABLET | Freq: Every day | ORAL | 1 refills | Status: DC

## 2016-02-29 MED ORDER — ATORVASTATIN CALCIUM 40 MG PO TABS: 40.0000 mg | ORAL_TABLET | Freq: Every day | ORAL | 0 refills | Status: DC

## 2016-02-29 NOTE — Telephone Encounter (Signed)
Patient called to request refill of atorvastatin (LIPITOR) 40 MG tablet

## 2016-02-29 NOTE — Telephone Encounter (Signed)
Rn spoke with Rizwan in the research dept. He verified pt is still in the research study. Rn will continue refills for Lipitor. Rn also call patient that her refills will continue for Lipitor for 9 days refill of one. Med done sent to pharmacy.

## 2016-03-27 ENCOUNTER — Ambulatory Visit (INDEPENDENT_AMBULATORY_CARE_PROVIDER_SITE_OTHER): Payer: 59 | Admitting: *Deleted

## 2016-03-27 DIAGNOSIS — I639 Cerebral infarction, unspecified: Secondary | ICD-10-CM

## 2016-03-27 LAB — CUP PACEART REMOTE DEVICE CHECK
Date Time Interrogation Session: 20171013174233
Implantable Pulse Generator Implant Date: 20161018

## 2016-03-27 NOTE — Progress Notes (Signed)
Carelink summary report received. Battery status OK. Normal device function. No new symptom episodes, tachy episodes, brady, or pause episodes. No new AF episodes. Monthly summary reports and ROV/PRN 

## 2016-03-27 NOTE — Progress Notes (Signed)
Carelink Summary Report / Loop Recorder 

## 2016-04-25 ENCOUNTER — Ambulatory Visit (INDEPENDENT_AMBULATORY_CARE_PROVIDER_SITE_OTHER): Payer: 59 | Admitting: *Deleted

## 2016-04-25 DIAGNOSIS — I639 Cerebral infarction, unspecified: Secondary | ICD-10-CM | POA: Diagnosis not present

## 2016-04-26 NOTE — Progress Notes (Signed)
Carelink Summary Report / Loop Recorder 

## 2016-05-10 LAB — CUP PACEART REMOTE DEVICE CHECK
Implantable Pulse Generator Implant Date: 20161018
MDC IDC SESS DTM: 20171112183724

## 2016-05-25 ENCOUNTER — Ambulatory Visit (INDEPENDENT_AMBULATORY_CARE_PROVIDER_SITE_OTHER): Payer: 59 | Admitting: *Deleted

## 2016-05-25 DIAGNOSIS — I639 Cerebral infarction, unspecified: Secondary | ICD-10-CM

## 2016-05-25 NOTE — Progress Notes (Signed)
Carelink Summary Report / Loop Recorder 

## 2016-06-15 LAB — CUP PACEART REMOTE DEVICE CHECK
MDC IDC PG IMPLANT DT: 20161018
MDC IDC SESS DTM: 20171212184705

## 2016-06-15 NOTE — Progress Notes (Signed)
Carelink summary report received. Battery status OK. Normal device function. No new symptom episodes, tachy episodes, brady, or pause episodes. No new AF episodes. Monthly summary reports and ROV/PRN 

## 2016-06-26 ENCOUNTER — Ambulatory Visit (INDEPENDENT_AMBULATORY_CARE_PROVIDER_SITE_OTHER): Payer: 59 | Admitting: *Deleted

## 2016-06-26 DIAGNOSIS — I639 Cerebral infarction, unspecified: Secondary | ICD-10-CM | POA: Diagnosis not present

## 2016-06-26 NOTE — Progress Notes (Signed)
Carelink Summary Report / Loop Recorder 

## 2016-07-05 LAB — CUP PACEART REMOTE DEVICE CHECK
Date Time Interrogation Session: 20180111190729
MDC IDC PG IMPLANT DT: 20161018

## 2016-07-15 ENCOUNTER — Other Ambulatory Visit: Payer: Self-pay | Admitting: Family Medicine

## 2016-07-15 DIAGNOSIS — F329 Major depressive disorder, single episode, unspecified: Secondary | ICD-10-CM

## 2016-07-15 DIAGNOSIS — F32A Depression, unspecified: Secondary | ICD-10-CM

## 2016-07-24 ENCOUNTER — Ambulatory Visit (INDEPENDENT_AMBULATORY_CARE_PROVIDER_SITE_OTHER): Payer: 59 | Admitting: *Deleted

## 2016-07-24 DIAGNOSIS — I639 Cerebral infarction, unspecified: Secondary | ICD-10-CM | POA: Diagnosis not present

## 2016-07-24 LAB — CUP PACEART REMOTE DEVICE CHECK
MDC IDC PG IMPLANT DT: 20161018
MDC IDC SESS DTM: 20180210191318

## 2016-07-25 NOTE — Progress Notes (Signed)
Carelink Summary Report / Loop Recorder 

## 2016-07-27 ENCOUNTER — Telehealth: Payer: Self-pay | Admitting: Cardiology

## 2016-07-27 NOTE — Telephone Encounter (Signed)
Spoke w/ pt and requested that she send a manual transmission b/c her home monitor has not updated in at least 14 days.   

## 2016-07-30 LAB — CUP PACEART REMOTE DEVICE CHECK
Date Time Interrogation Session: 20180312194618
Implantable Pulse Generator Implant Date: 20161018

## 2016-07-30 NOTE — Progress Notes (Signed)
Carelink summary report received. Battery status OK. Normal device function. No new symptom episodes, tachy episodes, brady, or pause episodes. No new AF episodes. Monthly summary reports and ROV/PRN 

## 2016-08-14 ENCOUNTER — Other Ambulatory Visit: Payer: Self-pay | Admitting: Obstetrics and Gynecology

## 2016-08-14 DIAGNOSIS — Z1231 Encounter for screening mammogram for malignant neoplasm of breast: Secondary | ICD-10-CM

## 2016-08-23 ENCOUNTER — Encounter: Payer: 59 | Admitting: *Deleted

## 2016-08-23 ENCOUNTER — Ambulatory Visit (INDEPENDENT_AMBULATORY_CARE_PROVIDER_SITE_OTHER): Payer: 59 | Admitting: *Deleted

## 2016-08-23 DIAGNOSIS — I639 Cerebral infarction, unspecified: Secondary | ICD-10-CM | POA: Diagnosis not present

## 2016-08-24 NOTE — Progress Notes (Signed)
Carelink Summary Report / Loop Recorder 

## 2016-08-27 ENCOUNTER — Other Ambulatory Visit: Payer: Self-pay | Admitting: Neurology

## 2016-09-01 LAB — CUP PACEART REMOTE DEVICE CHECK
Date Time Interrogation Session: 20180411201537
MDC IDC PG IMPLANT DT: 20161018

## 2016-09-07 ENCOUNTER — Ambulatory Visit
Admission: RE | Admit: 2016-09-07 | Discharge: 2016-09-07 | Disposition: A | Discharge status: Home / Self Care | Payer: Managed Care, Other (non HMO) | Source: Ambulatory Visit | Attending: Obstetrics and Gynecology | Admitting: Obstetrics and Gynecology

## 2016-09-07 DIAGNOSIS — Z1231 Encounter for screening mammogram for malignant neoplasm of breast: Secondary | ICD-10-CM

## 2016-09-11 LAB — CBC AND DIFFERENTIAL: Hemoglobin: 14.7 g/dL (ref 12.0–16.0)

## 2016-09-12 ENCOUNTER — Encounter: Payer: Self-pay | Admitting: Family Medicine

## 2016-09-20 ENCOUNTER — Other Ambulatory Visit: Payer: Self-pay | Admitting: Neurology

## 2016-09-20 ENCOUNTER — Telehealth: Payer: Self-pay | Admitting: Neurology

## 2016-09-20 MED ORDER — CLOPIDOGREL BISULFATE 75 MG PO TABS: 75.0000 mg | ORAL_TABLET | Freq: Every day | ORAL | 11 refills | Status: DC

## 2016-09-20 NOTE — Telephone Encounter (Signed)
RESPECT ESUS END OF STUDY VISIT  Patient was seen today for the end of study visit. She continues to do well. She still has some intermittent paresthesias in the right hand but denies any new or recurring stroke or TIA symptoms. She has stopped the study medication today.  Study specificc physical and neurological exam was performed as per protocol. Neurologic Exam Mental Status: Awake and fully alert. Oriented to place and time. Recent and remote memory intact. Attention span, concentration and fund of knowledge appropriate. Mood and affect appropriate.  Cranial Nerves: Fundoscopic exam not done. Pupils equal, briskly reactive to light. Extraocular movements full without nystagmus. Visual fields full to confrontation. Hearing intact. Facial sensation intact. Face, tongue, palate moves normally and symmetrically.  Motor: Normal bulk and tone. Normal strength in all tested extremity muscles. Sensory.: intact to touch and pinprick and vibratory sensation.  Coordination: Rapid alternating movements normal in all extremities. Finger-to-nose and heel-to-shin performed accurately bilaterally. Gait and Station: Arises from chair without difficulty. Stance is normal. Gait demonstrates normal stride length and balance . Able to heel, toe and tandem walk without difficulty.  Reflexes: 1+ and symmetric. Toes downgoing.   Plan : Patient was advised to use start taking Plavix 75 again daily and was given a prescription for the same. She was advised to maintain strict control of hypertension but blood pressure goal below 130/90, lipids with LDL cholesterol goal below 70 mg percent and diabetes with hemoglobin A1c goal below 6.5%. She was also advised rate healthy diet with plenty of whole gr, cereals, fruits, vegetables and exercise regularly. She will return for follow-up in the stroke clinic in 6 months or call earlier if necessary  Antony Contras, MD

## 2016-09-22 ENCOUNTER — Ambulatory Visit (INDEPENDENT_AMBULATORY_CARE_PROVIDER_SITE_OTHER): Payer: Managed Care, Other (non HMO) | Admitting: *Deleted

## 2016-09-22 DIAGNOSIS — I639 Cerebral infarction, unspecified: Secondary | ICD-10-CM | POA: Diagnosis not present

## 2016-09-25 NOTE — Progress Notes (Signed)
Carelink Summary Report / Loop Recorder 

## 2016-09-26 ENCOUNTER — Ambulatory Visit: Payer: Managed Care, Other (non HMO)

## 2016-09-26 ENCOUNTER — Encounter: Payer: Self-pay | Admitting: Family Medicine

## 2016-09-26 ENCOUNTER — Ambulatory Visit (INDEPENDENT_AMBULATORY_CARE_PROVIDER_SITE_OTHER): Payer: Managed Care, Other (non HMO) | Admitting: Family Medicine

## 2016-09-26 VITALS — BP 122/76 | HR 66 | Temp 97.9°F | Ht 61.0 in | Wt 146.2 lb

## 2016-09-26 DIAGNOSIS — Z23 Encounter for immunization: Secondary | ICD-10-CM | POA: Diagnosis not present

## 2016-09-26 DIAGNOSIS — R0683 Snoring: Secondary | ICD-10-CM | POA: Diagnosis not present

## 2016-09-26 DIAGNOSIS — G8929 Other chronic pain: Secondary | ICD-10-CM | POA: Diagnosis not present

## 2016-09-26 DIAGNOSIS — M545 Low back pain, unspecified: Secondary | ICD-10-CM | POA: Insufficient documentation

## 2016-09-26 DIAGNOSIS — M5441 Lumbago with sciatica, right side: Secondary | ICD-10-CM

## 2016-09-26 NOTE — Assessment & Plan Note (Signed)
S:R side low back pain radiating into lateral right hip. Shopping for an hour has to sit down because its bothering her. Tylenol afterwards. Has tried ice and helps some.  Has used a topical as well.   Long term issue- noted degenerative changes in MRI back to 2006. MR lumbar spine 2006. Facet arthropathy L4-L% and L5-s1  A/P: asks what she can do to prevent this. Discussed tiral of tylenol before the activity instead of after. Weight loss 10 lbs also advised as may reduce strain though she is only overweight and not obese.

## 2016-09-26 NOTE — Progress Notes (Signed)
Subjective:  Brianna Simon is a 66 y.o. year old very pleasant female patient who presents for/with See problem oriented charting ROS- right side low back pain, difficulty sleeping. No chest pain or shortness of breath.    Past Medical History-  Patient Active Problem List   Diagnosis Date Noted  . 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 History of CVA (cerebrovascular accident) 02/22/2010    Priority: High  . Former smoker 12/09/2014    Priority: Medium  . Anxiety state 10/27/2014    Priority: Medium  . Hyperlipidemia 02/22/2010    Priority: Medium  . Essential hypertension 12/07/2006    Priority: Medium  . Allergic rhinitis 10/27/2014    Priority: Low  . Esophageal reflux 12/31/2006    Priority: Low  . Insomnia 12/31/2006    Priority: Low  . MVP (mitral valve prolapse) 12/10/2006    Priority: Low  . Low back pain 09/26/2016  . Dysarthria 03/02/2015    Medications- reviewed and updated Current Outpatient Prescriptions  Medication Sig Dispense Refill  . atorvastatin (LIPITOR) 40 MG tablet TAKE 1 TABLET (40 MG TOTAL) BY MOUTH DAILY AT 6 PM. 90 tablet 1  . Azelastine-Fluticasone (DYMISTA) 137-50 MCG/ACT SUSP Place 1 spray into the nose 2 (two) times daily.    . clopidogrel (PLAVIX) 75 MG tablet Take 1 tablet (75 mg total) by mouth daily. 30 tablet 11  . fexofenadine (ALLEGRA) 180 MG tablet Take 180 mg by mouth daily.    . hydrochlorothiazide (HYDRODIURIL) 25 MG tablet TAKE 1/2 TABLET BY MOUTH ONCE DAILY 45 tablet 3  . montelukast (SINGULAIR) 10 MG tablet Take 1 tablet by mouth at bedtime.  5  . Multiple Vitamin (MULTIVITAMIN WITH MINERALS) TABS tablet Take 1 tablet by mouth daily.    . sertraline (ZOLOFT) 100 MG tablet TAKE 1 TABLET BY MOUTH DAILY 90 tablet 1  . VITAMIN D, CHOLECALCIFEROL, PO Take 1 tablet by mouth daily.     . valACYclovir (VALTREX) 1000 MG tablet Take 1 tablet (1,000 mg total) by mouth 3 (three) times daily. (Patient not taking:  Reported on 09/26/2016) 21 tablet 0   No current facility-administered medications for this visit.     Objective: BP 122/76 (BP Location: Left Arm, Patient Position: Sitting, Cuff Size: Normal)   Pulse 66   Temp 97.9 F (36.6 C) (Oral)   Ht 5\' 1"  (1.549 m)   Wt 146 lb 3.2 oz (66.3 kg)   SpO2 99%   BMI 27.62 kg/m  Gen: NAD, resting comfortably CV: RRR no murmurs rubs or gallops Lungs: CTAB no crackles, wheeze, rhonchi Ext: no edema Skin: warm, dry  Assessment/Plan:  Snoring - Plan: Ambulatory referral to Sleep Studies S: Trouble sleeping 1-2 hours before she goes to sleep. Sleeps for 10 hours when she gets to sleep. Does not sleep in daytime. Deviated septum fixed, has tried nose spray. No pauses in breathing. Snoring for years- had hoped above would help. Wakes husband up A/P: may simply be sleep onset insomnia but she is also concerned about sleep apnea. Requested referral to [facility]- appears this is located within [facility]- referral placed.   Low back pain S:R side low back pain radiating into lateral right hip. Shopping for an hour has to sit down because its bothering her. Tylenol afterwards. Has tried ice and helps some.  Has used a topical as well.   Long term issue- noted degenerative changes in MRI back to 2006. MR lumbar spine  2006. Facet arthropathy L4-L% and L5-s1  A/P: asks what she can do to prevent this. Discussed tiral of tylenol before the activity instead of after. Weight loss 10 lbs also advised as may reduce strain though she is only overweight and not obese.    PRN follow up for above issues.  Shingrix #1 today. Repeat injection in 2-3 months. Schedule this before you leave.  Orders Placed This Encounter  Procedures  . Varicella-zoster vaccine IM  . Ambulatory referral to Sleep Studies    Referral Priority:   Routine    Referral Type:   Consultation    Referral Reason:   Specialty Services Required    Number of Visits  Requested:   1    Meds ordered this encounter  Medications  . montelukast (SINGULAIR) 10 MG tablet    Sig: Take 1 tablet by mouth at bedtime.    Refill:  5  . fexofenadine (ALLEGRA) 180 MG tablet    Sig: Take 180 mg by mouth daily.  . Azelastine-Fluticasone (DYMISTA) 137-50 MCG/ACT SUSP    Sig: Place 1 spray into the nose 2 (two) times daily.    Return precautions advised.  Garret Reddish, MD

## 2016-09-26 NOTE — Patient Instructions (Signed)
Try the tylenol before prolonged walking  Shingrix #1 today. Repeat injection in 2-3 months. Schedule this before you leave.   We will call you within a week or two about your referral for sleep study. If you do not hear within 3 weeks, give [place] a call.

## 2016-09-28 LAB — CUP PACEART REMOTE DEVICE CHECK
Date Time Interrogation Session: 20180511203833
MDC IDC PG IMPLANT DT: 20161018

## 2016-10-23 ENCOUNTER — Ambulatory Visit (INDEPENDENT_AMBULATORY_CARE_PROVIDER_SITE_OTHER): Payer: Managed Care, Other (non HMO) | Admitting: *Deleted

## 2016-10-23 DIAGNOSIS — I639 Cerebral infarction, unspecified: Secondary | ICD-10-CM | POA: Diagnosis not present

## 2016-10-23 NOTE — Progress Notes (Signed)
Carelink Summary Report / Loop Recorder 

## 2016-10-30 ENCOUNTER — Telehealth: Payer: Self-pay

## 2016-10-30 NOTE — Telephone Encounter (Signed)
PA sent to patients insurance on cover my meds. Pending for plavix approval or denial.

## 2016-10-31 ENCOUNTER — Other Ambulatory Visit: Payer: Self-pay

## 2016-10-31 ENCOUNTER — Telehealth: Payer: Self-pay | Admitting: Neurology

## 2016-10-31 ENCOUNTER — Encounter (HOSPITAL_COMMUNITY): Payer: Self-pay | Admitting: Nurse Practitioner

## 2016-10-31 ENCOUNTER — Emergency Department (HOSPITAL_COMMUNITY): Payer: Managed Care, Other (non HMO)

## 2016-10-31 ENCOUNTER — Emergency Department (HOSPITAL_COMMUNITY)
Admission: EM | Admit: 2016-10-31 | Discharge: 2016-10-31 | Disposition: A | Discharge status: Home / Self Care | Payer: Managed Care, Other (non HMO) | Attending: Emergency Medicine | Admitting: Emergency Medicine

## 2016-10-31 DIAGNOSIS — Z79899 Other long term (current) drug therapy: Secondary | ICD-10-CM | POA: Diagnosis not present

## 2016-10-31 DIAGNOSIS — Z7902 Long term (current) use of antithrombotics/antiplatelets: Secondary | ICD-10-CM | POA: Diagnosis not present

## 2016-10-31 DIAGNOSIS — Z85828 Personal history of other malignant neoplasm of skin: Secondary | ICD-10-CM | POA: Diagnosis not present

## 2016-10-31 DIAGNOSIS — R42 Dizziness and giddiness: Secondary | ICD-10-CM | POA: Insufficient documentation

## 2016-10-31 DIAGNOSIS — I10 Essential (primary) hypertension: Secondary | ICD-10-CM | POA: Insufficient documentation

## 2016-10-31 DIAGNOSIS — Z8673 Personal history of transient ischemic attack (TIA), and cerebral infarction without residual deficits: Secondary | ICD-10-CM | POA: Diagnosis not present

## 2016-10-31 DIAGNOSIS — Z87891 Personal history of nicotine dependence: Secondary | ICD-10-CM | POA: Diagnosis not present

## 2016-10-31 LAB — CUP PACEART REMOTE DEVICE CHECK
Implantable Pulse Generator Implant Date: 20161018
MDC IDC SESS DTM: 20180610203928

## 2016-10-31 LAB — COMPREHENSIVE METABOLIC PANEL
ALBUMIN: 3.8 g/dL (ref 3.5–5.0)
ALK PHOS: 141 U/L — AB (ref 38–126)
ALT: 28 U/L (ref 14–54)
ANION GAP: 11 (ref 5–15)
AST: 23 U/L (ref 15–41)
BUN: 11 mg/dL (ref 6–20)
CO2: 24 mmol/L (ref 22–32)
Calcium: 9.2 mg/dL (ref 8.9–10.3)
Chloride: 105 mmol/L (ref 101–111)
Creatinine, Ser: 0.72 mg/dL (ref 0.44–1.00)
GFR calc Af Amer: >60 mL/min (ref >60)
GFR calc non Af Amer: >60 mL/min (ref >60)
GLUCOSE: 99 mg/dL (ref 65–99)
POTASSIUM: 3.6 mmol/L (ref 3.5–5.1)
SODIUM: 140 mmol/L (ref 135–145)
Total Bilirubin: 0.7 mg/dL (ref 0.3–1.2)
Total Protein: 6.8 g/dL (ref 6.5–8.1)

## 2016-10-31 LAB — CBC
HCT: 44 % (ref 36.0–46.0)
HEMOGLOBIN: 14.8 g/dL (ref 12.0–15.0)
MCH: 30.3 pg (ref 26.0–34.0)
MCHC: 33.6 g/dL (ref 30.0–36.0)
MCV: 90.2 fL (ref 78.0–100.0)
PLATELETS: 348 10*3/uL (ref 150–400)
RBC: 4.88 MIL/uL (ref 3.87–5.11)
RDW: 13.6 % (ref 11.5–15.5)
WBC: 6.1 10*3/uL (ref 4.0–10.5)

## 2016-10-31 LAB — I-STAT CHEM 8, ED
BUN: 13 mg/dL (ref 6–20)
CHLORIDE: 102 mmol/L (ref 101–111)
CREATININE: 0.6 mg/dL (ref 0.44–1.00)
Calcium, Ion: 1.11 mmol/L — ABNORMAL LOW (ref 1.15–1.40)
Glucose, Bld: 98 mg/dL (ref 65–99)
HEMATOCRIT: 45 % (ref 36.0–46.0)
Hemoglobin: 15.3 g/dL — ABNORMAL HIGH (ref 12.0–15.0)
Potassium: 3.5 mmol/L (ref 3.5–5.1)
SODIUM: 139 mmol/L (ref 135–145)
TCO2: 27 mmol/L (ref 0–100)

## 2016-10-31 LAB — I-STAT TROPONIN, ED: Troponin i, poc: 0 ng/mL (ref 0.00–0.08)

## 2016-10-31 LAB — DIFFERENTIAL
BASOS ABS: 0 10*3/uL (ref 0.0–0.1)
Basophils Relative: 1 %
EOS PCT: 2 %
Eosinophils Absolute: 0.2 10*3/uL (ref 0.0–0.7)
Lymphocytes Relative: 22 %
Lymphs Abs: 1.3 10*3/uL (ref 0.7–4.0)
Monocytes Absolute: 0.6 10*3/uL (ref 0.1–1.0)
Monocytes Relative: 10 %
NEUTROS ABS: 4 10*3/uL (ref 1.7–7.7)
NEUTROS PCT: 65 %

## 2016-10-31 LAB — PROTIME-INR
INR: 1.11
PROTHROMBIN TIME: 14.4 s (ref 11.4–15.2)

## 2016-10-31 LAB — APTT: APTT: 33 s (ref 24–36)

## 2016-10-31 NOTE — Telephone Encounter (Signed)
Noted. Thank you.   Rosalin Hawking, MD PhD Stroke Neurology 10/31/2016 4:50 PM

## 2016-10-31 NOTE — ED Notes (Signed)
Pt given [place] sandwich per Dr. Eulis Foster.

## 2016-10-31 NOTE — ED Notes (Signed)
Pt transported to MRI 

## 2016-10-31 NOTE — Telephone Encounter (Signed)
Message sent to DR. Xu while Dr. Leonie Man is out to review.

## 2016-10-31 NOTE — ED Triage Notes (Signed)
Pt presents with c/o dizziness. She noticed the dizziness when she woke up this morning. The dizziness has been constant since onset. The dizziness feels similar to when she had strokes in the past. She reports residual R arm numbness and speech difficulty from past strokes that is no worse today. She denies any pain, weakness.

## 2016-10-31 NOTE — ED Notes (Signed)
ED Provider at bedside. 

## 2016-10-31 NOTE — Telephone Encounter (Signed)
Patients husband called office to advise Dr. Fransico Meadow he is taking patient to [facility] ER for dizziness and lightheadedness since this morning upon waking up.

## 2016-10-31 NOTE — ED Provider Notes (Signed)
[facility] DEPT Provider Note   CSN: [id number] Arrival date & time: 10/31/16  1545     History   Chief Complaint Chief Complaint  Patient presents with  . Stroke Symptoms    HPI Brianna Simon is a 66 y.o. female.  She presents for evaluation of dizziness, which started this morning and she awoke.  Dizziness is persistent, and aggravated by both walking and head movement. Today she was walking and bumped into a door because she was off balance.  It feels similar to prior episodes of "stroke." She denies headache, trouble talking, confusion, weakness, difficulty eating, or paresthesia.  No other recent illnesses including fever, chills, shortness of breath, chest pain, nausea, vomiting, dysuria or constipation.  There are no other known modifying factors.  HPI  Past Medical History:  Diagnosis Date  . Anxiety   . Arthritis    hips, hands, lower back - otc med prn  . Cancer ([place])    skin cancer left wrist and left chest  . GERD (gastroesophageal reflux disease)   . Hx: UTI (urinary tract infection)   . Hyperlipidemia   . Hypertension   . Mitral valve prolapse    does not cause patient any problems  . Seasonal allergies   . Stroke [facility]) 2011   mild  . SVD (spontaneous vaginal delivery)    x 1 - gave up for adoption    Patient Active Problem List   Diagnosis Date Noted  . Low back pain 09/26/2016  . Dysarthria 03/02/2015  . 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 Former smoker 12/09/2014  . Allergic rhinitis 10/27/2014  . Anxiety state 10/27/2014  . Hyperlipidemia 02/22/2010  . History of CVA (cerebrovascular accident) 02/22/2010  . Esophageal reflux 12/31/2006  . Insomnia 12/31/2006  . MVP (mitral valve prolapse) 12/10/2006  . Essential hypertension 12/07/2006    Past Surgical History:  Procedure Laterality Date  . COLONOSCOPY    . DILATATION & CURETTAGE/HYSTEROSCOPY WITH TRUECLEAR N/A 10/24/2013   Procedure: DILATATION & CURETTAGE/HYSTEROSCOPY WITH  TRUCLEAR;  Surgeon: Logan Bores, MD;  Location: [facility];  Service: Gynecology;  Laterality: N/A;  1 1/2hrs OR time  . EP IMPLANTABLE DEVICE N/A 03/02/2015   Procedure: Loop Recorder Insertion;  Surgeon: Will Meredith Leeds, MD;  Location: [facility];  Service: Cardiovascular;  Laterality: N/A;  . lipoma removed     right shoulder  . SINUS SURGERY WITH INSTATRAK    . WISDOM TOOTH EXTRACTION      OB History    No data available       Home Medications    Prior to Admission medications   Medication Sig Start Date End Date Taking? Authorizing Provider  atorvastatin (LIPITOR) 40 MG tablet TAKE 1 TABLET (40 MG TOTAL) BY MOUTH DAILY AT 6 PM. 08/28/16  Yes Garvin Fila, MD  Azelastine-Fluticasone (DYMISTA) 137-50 MCG/ACT SUSP Place 1 spray into the nose 2 (two) times daily.   Yes [provider]  cholecalciferol (VITAMIN D) 1000 units tablet Take 1,000 Units by mouth daily.   Yes [provider]  clopidogrel (PLAVIX) 75 MG tablet Take 1 tablet (75 mg total) by mouth daily. 09/20/16  Yes Garvin Fila, MD  fexofenadine (ALLEGRA) 180 MG tablet Take 180 mg by mouth daily.   Yes [provider]  hydrochlorothiazide (HYDRODIURIL) 25 MG tablet TAKE 1/2 TABLET BY MOUTH ONCE DAILY 01/12/16  Yes Marin Olp, MD  montelukast (SINGULAIR) 10 MG tablet Take 1 tablet by mouth at  bedtime. 09/13/16  Yes [provider]  ranitidine (ZANTAC) 150 MG tablet Take 150 mg by mouth daily.   Yes [provider]  sertraline (ZOLOFT) 100 MG tablet TAKE 1 TABLET BY MOUTH DAILY 07/17/16  Yes Marin Olp, MD  valACYclovir (VALTREX) 1000 MG tablet Take 1 tablet (1,000 mg total) by mouth 3 (three) times daily. Patient not taking: Reported on 09/26/2016 05/31/15   Marin Olp, MD    Family History Family History  Problem Relation Age of Onset  . Arrhythmia Mother 51       pacemaker   . Stroke Mother   . Stroke Paternal Grandmother   .  Diverticulitis Other   . Breast cancer Maternal Aunt   . Breast cancer Cousin     Social History Social History  Substance Use Topics  . Smoking status: Former Smoker    Packs/day: 2.00    Years: 30.00    Types: Cigarettes    Quit date: 03/17/2003  . Smokeless tobacco: Never Used  . Alcohol use 1.8 oz/week    1 Glasses of wine, 1 Cans of beer, 1 Shots of liquor per week     Comment: occasionally     Allergies   Citalopram hydrobromide; Codeine phosphate; Nitrofurantoin; Nsaids; and Sulfamethoxazole-trimethoprim   Review of Systems Review of Systems  All other systems reviewed and are negative.    Physical Exam Updated Vital Signs BP 125/71   Pulse 64   Temp 98.6 F (37 C)   Resp 16   SpO2 99%   Physical Exam  Constitutional: She is oriented to person, place, and time. She appears well-developed and well-nourished. No distress.  HENT:  Head: Normocephalic and atraumatic.  Eyes: Conjunctivae and EOM are normal. Pupils are equal, round, and reactive to light.  Neck: Normal range of motion and phonation normal. Neck supple.  Cardiovascular: Normal rate and regular rhythm.   Pulmonary/Chest: Effort normal and breath sounds normal. She exhibits no tenderness.  Abdominal: Soft. She exhibits no distension. There is no tenderness. There is no guarding.  Musculoskeletal: Normal range of motion.  Neurological: She is alert and oriented to person, place, and time. She exhibits normal muscle tone.  No dysarthria, aphasia or nystagmus.  No pronator drift.  Normal finger to nose, and heel to shin, bilaterally.  With head impulse testing, patient experiences dizziness, but no nystagmus.  Negative test of skew.  Skin: Skin is warm and dry.  Psychiatric: She has a normal mood and affect. Her behavior is normal. Judgment and thought content normal.  Nursing note and vitals reviewed.    ED Treatments / Results  Labs (all labs ordered are listed, but only abnormal results are  displayed) Labs Reviewed  COMPREHENSIVE METABOLIC PANEL - Abnormal; Notable for the following:       Result Value   Alkaline Phosphatase 141 (*)    All other components within normal limits  I-STAT CHEM 8, ED - Abnormal; Notable for the following:    Calcium, Ion 1.11 (*)    Hemoglobin 15.3 (*)    All other components within normal limits  PROTIME-INR  APTT  CBC  DIFFERENTIAL  I-STAT TROPOININ, ED  CBG MONITORING, ED    EKG  EKG Interpretation  Date/Time:  Tuesday October 31 2016 16:03:24 EDT Ventricular Rate:  76 PR Interval:  204 QRS Duration: 86 QT Interval:  404 QTC Calculation: 454 R Axis:   23 Text Interpretation:  Normal sinus rhythm Low voltage QRS Cannot rule out Anterior  infarct , age undetermined Abnormal ECG since last tracing no significant change Confirmed by Daleen Bo [phone]) on 10/31/2016 9:11:39 PM       Radiology Ct Head Wo Contrast  Result Date: 10/31/2016 CLINICAL DATA:  Dizziness and lightheadedness since this morning, no known injury, history of strokes, hypertension, hyperlipidemia EXAM: CT HEAD WITHOUT CONTRAST TECHNIQUE: Contiguous axial images were obtained from the base of the skull through the vertex without intravenous contrast. Sagittal and coronal MPR images reconstructed from axial data set. COMPARISON:  02/27/2015 FINDINGS: Brain: Generalized atrophy. Normal ventricular morphology. No midline shift or mass effect. Small vessel chronic ischemic changes of deep cerebral white matter. Old LEFT frontoparietal infarct. No intracranial hemorrhage, mass lesion, evidence of acute infarction, or extra-axial fluid collection. Vascular: Unremarkable Skull: Intact Sinuses/Orbits: Small mucosal retention cyst at apex of LEFT maxillary sinus. Minimal fluid or mucus within RIGHT sphenoid sinus. Other: N/A IMPRESSION: Atrophy with small vessel chronic ischemic changes of deep cerebral white matter. Small old LEFT MCA territory infarct. No acute intracranial  abnormalities. Electronically Signed   By: Lavonia Dana M.D.   On: 10/31/2016 16:52   Mr Brain Wo Contrast (neuro Protocol)  Result Date: 10/31/2016 CLINICAL DATA:  Initial evaluation for acute dizziness. EXAM: MRI HEAD WITHOUT CONTRAST TECHNIQUE: Multiplanar, multiecho pulse sequences of the brain and surrounding structures were obtained without intravenous contrast. COMPARISON:  Prior CT from earlier same day. FINDINGS: Brain: Diffuse prominence of the CSF containing spaces is compatible with generalized cerebral atrophy, moderate nature. Scattered T2/FLAIR hyperintense foci present within the periventricular, deep, and subcortical white matter both cerebral hemispheres, as well as the pons, nonspecific, but most like related to chronic small vessel ischemic disease, mild in nature. Encephalomalacia with gliosis within the posterior left frontal lobe consistent with remote posterior left MCA territory infarct. Small amount of associated chronic hemorrhagic blood products present with this infarct. Small remote lacunar infarct present within the left caudate head. Few scatter remote bilateral cerebellar infarcts noted. No abnormal foci of restricted diffusion to suggest acute or subacute ischemia. Gray-white matter differentiation maintained. No evidence for acute intracranial hemorrhage. No mass lesion, midline shift or mass effect. Ventricles normal size without evidence for hydrocephalus. No extra-axial fluid collection. Major dural sinuses are patent. Pituitary gland suprasellar region within normal limits. Midline structures intact and normal. Vascular: Major intracranial vascular flow voids are maintained. Skull and upper cervical spine: Craniocervical junction within normal limits. Mild degenerative spondylolysis noted at C4-5 and C5-6 without significant stenosis. Remainder the visualized upper cervical spine otherwise unremarkable. Bone marrow signal intensity within normal limits. No scalp soft tissue  abnormality. Sinuses/Orbits: Globes and orbital soft tissues within normal limits. Mild scattered opacity within the ethmoidal air cells bilaterally. Small retention cyst present within the right maxillary sinus. No mastoid effusion. Inner ear structures normal. IMPRESSION: 1. No acute intracranial process identified. 2. Remote posterior left MCA territory infarct, with additional small remote lacunar infarct within the left basal ganglia, and small bilateral remote cerebellar infarcts. 3. Atrophy with mild chronic small vessel ischemic disease. Electronically Signed   By: Jeannine Boga M.D.   On: 10/31/2016 23:01    Procedures Procedures (including critical care time)  Medications Ordered in ED Medications - No data to display   Initial Impression / Assessment and Plan / ED Course  I have reviewed the triage vital signs and the nursing notes.  Pertinent labs & imaging results that were available during my care of the patient were reviewed by me and considered in  my medical decision making (see chart for details).      Patient Vitals for the past 24 hrs:  BP Temp Temp src Pulse Resp SpO2  10/31/16 2200 125/71 - - 64 16 99 %  10/31/16 2150 - 98.6 F (37 C) - - - -  10/31/16 2145 131/66 - - (!) 57 13 96 %  10/31/16 2130 125/79 - - 69 (!) 25 98 %  10/31/16 2115 138/60 - - 64 17 97 %  10/31/16 2045 123/74 - - (!) 59 11 100 %  10/31/16 1825 122/60 - - 91 16 98 %  10/31/16 1556 124/76 98.2 F (36.8 C) Oral 78 18 95 %    11:13 PM Reevaluation with update and discussion. After initial assessment and treatment, an updated evaluation reveals remains calm comfortable has no further complaints.  Findings discussed with patient and husband, all questions were answered. Harden Bramer L    Final Clinical Impressions(s) / ED Diagnoses   Final diagnoses:  Dizziness   Nonspecific dizziness, possibly stress related.  Differential diagnosis includes peripheral vertigo.  Doubt stroke,  serious bacterial infection, metabolic instability or impending vascular collapse.  Nursing Notes Reviewed/ Care Coordinated Applicable Imaging Reviewed Interpretation of Laboratory Data incorporated into ED treatment  The patient appears reasonably screened and/or stabilized for discharge and I doubt any other medical condition or other [facility] requiring further screening, evaluation, or treatment in the ED at this time prior to discharge.  Plan: Home Medications-continue current medications; Home Treatments-rest; return here if the recommended treatment, does not improve the symptoms; Recommended follow up-PCP, as needed   New Prescriptions New Prescriptions   No medications on file     Daleen Bo, MD 10/31/16 2315

## 2016-11-06 ENCOUNTER — Other Ambulatory Visit: Payer: Self-pay

## 2016-11-06 DIAGNOSIS — I639 Cerebral infarction, unspecified: Secondary | ICD-10-CM

## 2016-11-06 MED ORDER — CLOPIDOGREL BISULFATE 75 MG PO TABS: 75.0000 mg | ORAL_TABLET | Freq: Every day | ORAL | 11 refills | Status: DC

## 2016-11-06 NOTE — Telephone Encounter (Signed)
Rn put in generic plavix for patient via computer to [facility]. Rn spoke with  Marjory Lies at [facility] he stated the new rx generic clopidogrel did go thru their system. He also stated it does not require a PA.

## 2016-11-06 NOTE — Telephone Encounter (Signed)
Rn receive fax that brand name clopidogrel plavix was denied from the PA. Rn call [facility] at [phone]. Rn spoke with rep. The rep stated the pts insurance will only cover generic clopidogrel. Rn stated a generic rx of clopidogrel will be sent to pts pharmacy. Pt has to failed generic clopidogrel, prasugrel brilinta two of these before she can have brand plavix.

## 2016-11-13 ENCOUNTER — Ambulatory Visit (INDEPENDENT_AMBULATORY_CARE_PROVIDER_SITE_OTHER): Payer: Managed Care, Other (non HMO) | Admitting: Family Medicine

## 2016-11-13 ENCOUNTER — Encounter: Payer: Self-pay | Admitting: Family Medicine

## 2016-11-13 ENCOUNTER — Institutional Professional Consult (permissible substitution): Payer: 59 | Admitting: Neurology

## 2016-11-13 DIAGNOSIS — H8110 Benign paroxysmal vertigo, unspecified ear: Secondary | ICD-10-CM

## 2016-11-13 NOTE — Patient Instructions (Signed)
Glad you are doing well and the vertigo has resolved  Seemed like BPPV (see below)  Please see [place] back if recurrence   Benign Positional Vertigo Vertigo is the feeling that you or your surroundings are moving when they are not. Benign positional vertigo is the most common form of vertigo. The cause of this condition is not serious (is benign). This condition is triggered by certain movements and positions (is positional). This condition can be dangerous if it occurs while you are doing something that could endanger you or others, such as driving. What are the causes? In many cases, the cause of this condition is not known. It may be caused by a disturbance in an area of the inner ear that helps your brain to sense movement and balance. This disturbance can be caused by a viral infection (labyrinthitis), head injury, or repetitive motion. What increases the risk? This condition is more likely to develop in:  Women.  People who are 62 years of age or older.  What are the signs or symptoms? Symptoms of this condition usually happen when you move your head or your eyes in different directions. Symptoms may start suddenly, and they usually last for less than a minute. Symptoms may include:  Loss of balance and falling.  Feeling like you are spinning or moving.  Feeling like your surroundings are spinning or moving.  Nausea and vomiting.  Blurred vision.  Dizziness.  Involuntary eye movement (nystagmus).  Symptoms can be mild and cause only slight annoyance, or they can be severe and interfere with daily life. Episodes of benign positional vertigo may return (recur) over time, and they may be triggered by certain movements. Symptoms may improve over time. How is this diagnosed? This condition is usually diagnosed by medical history and a physical exam of the head, neck, and ears. You may be referred to a health care provider who specializes in ear, nose, and throat (ENT) problems  (otolaryngologist) or a provider who specializes in disorders of the nervous system (neurologist). You may have additional testing, including:  MRI.  A CT scan.  Eye movement tests. Your health care provider may ask you to change positions quickly while he or she watches you for symptoms of benign positional vertigo, such as nystagmus. Eye movement may be tested with an electronystagmogram (ENG), caloric stimulation, the Dix-Hallpike test, or the roll test.  An electroencephalogram (EEG). This records electrical activity in your brain.  Hearing tests.  How is this treated? Usually, your health care provider will treat this by moving your head in specific positions to adjust your inner ear back to normal. Surgery may be needed in severe cases, but this is rare. In some cases, benign positional vertigo may resolve on its own in 2-4 weeks. Follow these instructions at home: Safety  Move slowly.Avoid sudden body or head movements.  Avoid driving.  Avoid operating heavy machinery.  Avoid doing any tasks that would be dangerous to you or others if a vertigo episode would occur.  If you have trouble walking or keeping your balance, try using a cane for stability. If you feel dizzy or unstable, sit down right away.  Return to your normal activities as told by your health care provider. Ask your health care provider what activities are safe for you. General instructions  Take over-the-counter and prescription medicines only as told by your health care provider.  Avoid certain positions or movements as told by your health care provider.  Drink enough fluid to keep  your urine clear or pale yellow.  Keep all follow-up visits as told by your health care provider. This is important. Contact a health care provider if:  You have a fever.  Your condition gets worse or you develop new symptoms.  Your family or friends notice any behavioral changes.  Your nausea or vomiting gets  worse.  You have numbness or a "pins and needles" sensation. Get help right away if:  You have difficulty speaking or moving.  You are always dizzy.  You faint.  You develop severe headaches.  You have weakness in your legs or arms.  You have changes in your hearing or vision.  You develop a stiff neck.  You develop sensitivity to light. This information is not intended to replace advice given to you by your health care provider. Make sure you discuss any questions you have with your health care provider. Document Released: 02/06/2006 Document Revised: 10/07/2015 Document Reviewed: 08/24/2014 Elsevier Interactive Patient Education  [employer].
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
## 2016-11-21 ENCOUNTER — Ambulatory Visit (INDEPENDENT_AMBULATORY_CARE_PROVIDER_SITE_OTHER): Payer: Managed Care, Other (non HMO) | Admitting: *Deleted

## 2016-11-21 DIAGNOSIS — I639 Cerebral infarction, unspecified: Secondary | ICD-10-CM | POA: Diagnosis not present

## 2016-11-22 NOTE — Progress Notes (Signed)
Carelink Summary Report / Loop Recorder 

## 2016-12-02 LAB — CUP PACEART REMOTE DEVICE CHECK
Implantable Pulse Generator Implant Date: 20161018
MDC IDC SESS DTM: 20180710213957

## 2016-12-05 ENCOUNTER — Ambulatory Visit: Payer: Managed Care, Other (non HMO)

## 2016-12-20 ENCOUNTER — Other Ambulatory Visit: Payer: Self-pay | Admitting: Family Medicine

## 2016-12-21 ENCOUNTER — Ambulatory Visit (INDEPENDENT_AMBULATORY_CARE_PROVIDER_SITE_OTHER): Payer: 59 | Admitting: *Deleted

## 2016-12-21 DIAGNOSIS — Z8673 Personal history of transient ischemic attack (TIA), and cerebral infarction without residual deficits: Secondary | ICD-10-CM

## 2016-12-26 ENCOUNTER — Ambulatory Visit (INDEPENDENT_AMBULATORY_CARE_PROVIDER_SITE_OTHER): Payer: 59

## 2016-12-26 DIAGNOSIS — Z23 Encounter for immunization: Secondary | ICD-10-CM

## 2016-12-28 LAB — CUP PACEART REMOTE DEVICE CHECK
MDC IDC PG IMPLANT DT: 20161018
MDC IDC SESS DTM: 20180809220858

## 2017-01-04 NOTE — Progress Notes (Signed)
Loop recorder summary report 

## 2017-01-11 ENCOUNTER — Other Ambulatory Visit: Payer: Self-pay | Admitting: Family Medicine

## 2017-01-11 DIAGNOSIS — F32A Depression, unspecified: Secondary | ICD-10-CM

## 2017-01-11 DIAGNOSIS — F329 Major depressive disorder, single episode, unspecified: Secondary | ICD-10-CM

## 2017-01-22 ENCOUNTER — Ambulatory Visit (INDEPENDENT_AMBULATORY_CARE_PROVIDER_SITE_OTHER): Payer: 59 | Admitting: *Deleted

## 2017-01-22 DIAGNOSIS — I639 Cerebral infarction, unspecified: Secondary | ICD-10-CM | POA: Diagnosis not present

## 2017-01-22 NOTE — Progress Notes (Signed)
Carelink Summary Report / Loop Recorder 

## 2017-01-25 LAB — CUP PACEART REMOTE DEVICE CHECK
Date Time Interrogation Session: 20180908223915
MDC IDC PG IMPLANT DT: 20161018

## 2017-02-19 ENCOUNTER — Ambulatory Visit (INDEPENDENT_AMBULATORY_CARE_PROVIDER_SITE_OTHER): Payer: 59 | Admitting: *Deleted

## 2017-02-19 ENCOUNTER — Telehealth: Payer: Self-pay | Admitting: Family Medicine

## 2017-02-19 DIAGNOSIS — I639 Cerebral infarction, unspecified: Secondary | ICD-10-CM | POA: Diagnosis not present

## 2017-02-19 NOTE — Telephone Encounter (Signed)
Patient called to advise that she has had a couple strokes and spoke to Dr. Yong Channel after the 2nd one and did not want a handicap sticker at that time. Patient has thought about it an does think she needs a handicap sticker.

## 2017-02-20 NOTE — Telephone Encounter (Signed)
Patient would like a call back RE handicapped sticker.  Thank you,  -LL

## 2017-02-20 NOTE — Progress Notes (Signed)
Carelink Summary Report / Loop Recorder 

## 2017-02-21 NOTE — Telephone Encounter (Signed)
Called patient and left a voicemail message asking for a return phone call. 

## 2017-02-21 NOTE — Telephone Encounter (Signed)
Patient called back. She will call back tomorrow around 1.

## 2017-02-22 LAB — CUP PACEART REMOTE DEVICE CHECK
Implantable Pulse Generator Implant Date: 20161018
MDC IDC SESS DTM: 20181008223911

## 2017-02-22 NOTE — Telephone Encounter (Signed)
Spoke with patient. Form completed. Placed at front desk and patient will pick up tomorrow

## 2017-02-23 ENCOUNTER — Ambulatory Visit (INDEPENDENT_AMBULATORY_CARE_PROVIDER_SITE_OTHER): Payer: 59

## 2017-02-23 ENCOUNTER — Other Ambulatory Visit: Payer: Self-pay | Admitting: Neurology

## 2017-02-23 DIAGNOSIS — Z23 Encounter for immunization: Secondary | ICD-10-CM

## 2017-02-26 ENCOUNTER — Other Ambulatory Visit: Payer: Self-pay

## 2017-02-26 MED ORDER — ATORVASTATIN CALCIUM 40 MG PO TABS: 40.0000 mg | ORAL_TABLET | Freq: Every day | ORAL | 0 refills | Status: DC

## 2017-03-21 ENCOUNTER — Ambulatory Visit (INDEPENDENT_AMBULATORY_CARE_PROVIDER_SITE_OTHER): Payer: 59 | Admitting: *Deleted

## 2017-03-21 DIAGNOSIS — I639 Cerebral infarction, unspecified: Secondary | ICD-10-CM | POA: Diagnosis not present

## 2017-03-22 LAB — CUP PACEART REMOTE DEVICE CHECK
Date Time Interrogation Session: 20181107231017
MDC IDC PG IMPLANT DT: 20161018

## 2017-03-22 NOTE — Progress Notes (Signed)
Carelink Summary Report / Loop Recorder 

## 2017-04-04 ENCOUNTER — Ambulatory Visit: Payer: 59 | Admitting: Neurology

## 2017-04-20 ENCOUNTER — Ambulatory Visit (INDEPENDENT_AMBULATORY_CARE_PROVIDER_SITE_OTHER): Payer: 59 | Admitting: *Deleted

## 2017-04-20 DIAGNOSIS — I639 Cerebral infarction, unspecified: Secondary | ICD-10-CM | POA: Diagnosis not present

## 2017-04-23 NOTE — Progress Notes (Signed)
Carelink Summary Report / Loop Recorder 

## 2017-04-30 LAB — CUP PACEART REMOTE DEVICE CHECK
Implantable Pulse Generator Implant Date: 20161018
MDC IDC SESS DTM: 20181207233744

## 2017-05-03 ENCOUNTER — Ambulatory Visit (INDEPENDENT_AMBULATORY_CARE_PROVIDER_SITE_OTHER): Payer: 59 | Admitting: Neurology

## 2017-05-03 ENCOUNTER — Encounter: Payer: Self-pay | Admitting: Neurology

## 2017-05-03 VITALS — BP 125/75 | HR 66 | Wt 145.6 lb

## 2017-05-03 DIAGNOSIS — I6990 Unspecified sequelae of unspecified cerebrovascular disease: Secondary | ICD-10-CM

## 2017-05-03 NOTE — Progress Notes (Signed)
Patient given copy of labs with Dr. Leonie Man signature. Pt can only go to [facility] per her insurance guidelines.

## 2017-05-03 NOTE — Patient Instructions (Addendum)
I had a long d/w patient about his remote cryptogenic stroke, risk for recurrent stroke/TIAs, personally independently reviewed imaging studies and stroke evaluation results and answered questions.Continue Plavix 75 mg daily  for secondary stroke prevention and maintain strict control of hypertension with blood pressure goal below 130/90, diabetes with hemoglobin A1c goal below 6.5% and lipids with LDL cholesterol goal below 70 mg/dL. I also advised the patient to eat a healthy diet with plenty of whole grains, cereals, fruits and vegetables, exercise regularly and maintain ideal body weight. I advised the patient to take melatonin 5 mg at sunset and to promote healthy  sleep hygiene to help her sleep Followup in the future with my nurse practitioner in one year or call earlier if necessary.

## 2017-05-03 NOTE — Progress Notes (Signed)
[facility] [address] [place]. [place] [address] [phone]       OFFICE FOLLOW-UP NOTE  [name] Date of Birth:  Jul 23, 1950 Medical Record Number:  [id number]   HPI: 8 year Caucasian lady with remote small left pareital embolic infarct in January 2011 without definite identified source of embolism.Vascular risk factors of Hypertension and Hyperlipidimia. Mild chronic lower extremities paresthesias  Update 02/10/2013  She returns for f/u after last visit on 01/16/2012.She remains stable without any new stroke or TIA signs and symptoms.She is tolerating aspirin well without side effects and BP is well controlled and last lipid profile in February this year was fine. She has mild paresthesias in her feet which appear unchanged from last visit.She has minor nasal bleeds related to her sinuses after sneezing but no unprovoked bleeding.Shehas intermittent occasional blurred vision and dizziness without vertigo. UPDATE 02/09/2014 : She returns for followup of the last visit 1 year ago. She continues to do well and has remained stroke symptom free since January 2011. She continues to take aspirin and states her blood pressure is under good control though it is slightly elevated at 148/79 in office today. She had her last lipid profile checked less than a year ago and was fine. She is tolerating her medications well without any side effects. She had it last carotid ultrasound and also a year ago. She had no new complaints today. 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 Patient had a loop recorder inserted but so far atrial fibrillation has not been found. LDL cholesterol is 86. HbA1c was 5.5. Hypercoagulable workup was all negative. TEE done in 2011 intracranial MRA and extracranial  carotid Dopplers did not show significant stenosis on the left side. She is tolerating the  RESPECT ESUSstudy medications without any significant bruising, bleeding or other side effects. Blood pressure is well controlled and it is 120/70 today. She is tolerating Lipitor without myalgias or arthralgias or other side effects. She continues to have occasional speech difficulties as well as tingling and numbness in the right side of the face and tongue as well as diminished taste. She is currently doing occupational therapy for her right hand. She's had trouble sleeping with some insomnia for sleep onset but 1 she sleep she can sleep 7-8 hours. Is requesting medication to help her sleep earlier. Update 05/03/2017 : She returns for follow-up after last visit with me when necessary of ago. She has completed parts patient in the respect cases trial. She is on Plavix now and tolerating it well without bruising or bleeding. She states her blood pressure is well controlled today it is 125/75. She is tolerating Lipitor well without muscle aches and pains. She cannot admit the last time lipid profile was checked. The patient was admitted to the hospital in June 2018 with dizziness which was felt to be due to benign paroxysmal positional vertigo. She states she is doing well and no longer has any vertigo or dizziness. She states she is eating healthy but has not lost any weight but neither has she gained any weight. She is quite active. She has not had follow-up carotid ultrasound done for more than 2 years. ROS:   14 system review of systems is positive for   fatigue, hearing loss, ear pain and ringing in the ears, all other systems  negative   PMH:  Past Medical History:  Diagnosis Date  . Anxiety   . Arthritis    hips, hands, lower back - otc med prn  . Cancer ([place])    skin cancer left wrist and left chest  . GERD (gastroesophageal reflux disease)   . Hx: UTI (urinary tract infection)   . Hyperlipidemia   .  Hypertension   . Mitral valve prolapse    does not cause patient any problems  . Seasonal allergies   . Stroke [facility]) 2011   mild  . SVD (spontaneous vaginal delivery)    x 1 - gave up for adoption    Social History:  Social History   Socioeconomic History  . Marital status: Married    Spouse name: Not on file  . Number of children: Not on file  . Years of education: Not on file  . Highest education level: Not on file  Social Needs  . Financial resource strain: Not on file  . Food insecurity - worry: Not on file  . Food insecurity - inability: Not on file  . Transportation needs - medical: Not on file  . Transportation needs - non-medical: Not on file  Occupational History  . Occupation: [employer]    Employer: [employer]  Tobacco Use  . Smoking status: Former Smoker    Packs/day: 2.00    Years: 30.00    Pack years: 60.00    Types: Cigarettes    Last attempt to quit: 03/17/2003    Years since quitting: 14.1  . Smokeless tobacco: Never Used  Substance and Sexual Activity  . Alcohol use: Yes    Alcohol/week: 1.8 oz    Types: 1 Glasses of wine, 1 Cans of beer, 1 Shots of liquor per week    Comment: occasionally  . Drug use: No  . Sexual activity: Yes    Birth control/protection: Post-menopausal  Other Topics Concern  . Not on file  Social History Narrative   Married. No children. Husband patient of Dr. 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       Hobbies: dancing, time out with husband, enjoys concerts    Medications:   Current Outpatient Medications on File Prior to Visit  Medication Sig Dispense Refill  . atorvastatin (LIPITOR) 40 MG tablet Take 1 tablet (40 mg total) by mouth daily at 6 PM. 90 tablet 0  . cholecalciferol (VITAMIN D) 1000 units tablet Take 1,000 Units by mouth daily.    . clopidogrel (PLAVIX) 75 MG tablet Take 1 tablet (75 mg total) by mouth daily. 30 tablet 11  . fexofenadine (ALLEGRA) 180 MG tablet Take 180  mg by mouth daily.    . hydrochlorothiazide (HYDRODIURIL) 25 MG tablet TAKE 1/2 TABLET BY MOUTH ONCE DAILY 45 tablet 3  . montelukast (SINGULAIR) 10 MG tablet Take 1 tablet by mouth at bedtime.  5  . ranitidine (ZANTAC) 150 MG tablet Take 150 mg by mouth daily.    . sertraline (ZOLOFT) 100 MG tablet TAKE 1 TABLET BY MOUTH DAILY 90 tablet 1   No current facility-administered medications on file prior to visit.     Allergies:   Allergies  Allergen Reactions  . Citalopram Hydrobromide     REACTION: blurred vision  . Codeine Phosphate     Liquid formulation only bothers her; tolerates percocet fine.  REACTION: nausea,vomiting  . Nitrofurantoin     REACTION: itching  . Nsaids Other (See Comments)  Elevated liver panel  . Sulfamethoxazole-Trimethoprim     REACTION: itching   Vitals:   05/03/17 1507  BP: 125/75  Pulse: 66     Physical Exam General: well developed, well nourished, seated, in no evident distress Head: head normocephalic and atraumatic.   Neck: supple with no carotid or supraclavicular bruits Cardiovascular: regular rate and rhythm, no murmurs Musculoskeletal: no deformity Skin:  no rash/petichiae Vascular:  Normal pulses all extremities Vitals:   05/03/17 1507  BP: 125/75  Pulse: 66    Neurologic Exam Mental Status: Awake and fully alert. Oriented to place and time. Recent and remote memory intact. Attention span, concentration and fund of knowledge appropriate. Mood and affect appropriate.  Cranial Nerves: Fundoscopic exam not done. Pupils equal, briskly reactive to light. Extraocular movements full without nystagmus. Visual fields full to confrontation. Hearing intact. Facial sensation intact. Face, tongue, palate moves normally and symmetrically.  Motor: Normal bulk and tone. Normal strength in all tested extremity muscles. Sensory.: intact to touch and pinprick and vibratory sensation. Except for right upper and lower lip where it is  diminished Coordination: Rapid alternating movements normal in all extremities. Finger-to-nose and heel-to-shin performed accurately bilaterally. Gait and Station: Arises from chair without difficulty. Stance is normal. Gait demonstrates normal stride length and balance . Able to heel, toe and tandem walk without difficulty.  Reflexes: 1+ and symmetric. Toes downgoing.      ASSESSMENT: 35 year Caucasian lady with remote small left pareital embolic infarcts in January 2011 and October 2016 without definite identified source of embolism.Vascular risk factors of Hypertension and Hyperlipidimia.  Patient completed partcicpation in the RESPECT ESUS trial( aspirin versus Pradaxa)    PLAN: I had a long d/w patient about his remote cryptogenic stroke, risk for recurrent stroke/TIAs, personally independently reviewed imaging studies and stroke evaluation results and answered questions.Continue Plavix 75 mg daily  for secondary stroke prevention and maintain strict control of hypertension with blood pressure goal below 130/90, diabetes with hemoglobin A1c goal below 6.5% and lipids with LDL cholesterol goal below 70 mg/dL. I also advised the patient to eat a healthy diet with plenty of whole grains, cereals, fruits and vegetables, exercise regularly and maintain ideal body weight. I advised the patient to take melatonin 5 mg at sunset and to promote healthy  sleep hygiene to help her sleep greater than 50% time during this 25 minute visit was spent on counseling and coordination of care about her cryptogenic stroke and discussion about stroke prevention Followup in the future with my nurse practitioner in one year or call earlier if necessary.  Antony Contras, MD

## 2017-05-11 ENCOUNTER — Ambulatory Visit (HOSPITAL_COMMUNITY)
Admission: RE | Admit: 2017-05-11 | Discharge: 2017-05-11 | Disposition: A | Discharge status: Home / Self Care | Payer: 59 | Source: Ambulatory Visit | Attending: Neurology | Admitting: Neurology

## 2017-05-11 DIAGNOSIS — I6990 Unspecified sequelae of unspecified cerebrovascular disease: Secondary | ICD-10-CM | POA: Diagnosis not present

## 2017-05-11 DIAGNOSIS — I6523 Occlusion and stenosis of bilateral carotid arteries: Secondary | ICD-10-CM | POA: Diagnosis not present

## 2017-05-11 DIAGNOSIS — I6529 Occlusion and stenosis of unspecified carotid artery: Secondary | ICD-10-CM | POA: Diagnosis present

## 2017-05-11 NOTE — Progress Notes (Signed)
Bilateral carotid duplex completed. 1% to 39% ICA stenosis. Lower end of scale. Vertebral artery flow is antegrade.  [facility], [place] 05/11/2017 3:00 PM

## 2017-05-17 ENCOUNTER — Telehealth: Payer: Self-pay

## 2017-05-17 NOTE — Telephone Encounter (Signed)
-----   Message from Garvin Fila, MD sent at 05/11/2017  3:30 PM EST ----- Kindly inform the patient that carotid ultrasound study showed no significant narrowing of the carotid arteries in the neck

## 2017-05-17 NOTE — Telephone Encounter (Signed)
Notes recorded by Marval Regal, RN on 05/17/2017 at 12:43 PM EST Rn call patient that her carotid ultrasound showed no significant narrowing of the carotid arteries in neck. Pt verbalized understanding. ------

## 2017-05-21 ENCOUNTER — Ambulatory Visit (INDEPENDENT_AMBULATORY_CARE_PROVIDER_SITE_OTHER): Payer: 59 | Admitting: *Deleted

## 2017-05-21 DIAGNOSIS — I639 Cerebral infarction, unspecified: Secondary | ICD-10-CM | POA: Diagnosis not present

## 2017-05-21 NOTE — Progress Notes (Signed)
Carelink Summary Report / Loop Recorder 

## 2017-05-23 ENCOUNTER — Other Ambulatory Visit: Payer: Self-pay | Admitting: Neurology

## 2017-05-24 ENCOUNTER — Other Ambulatory Visit: Payer: Self-pay

## 2017-05-24 MED ORDER — ATORVASTATIN CALCIUM 40 MG PO TABS: 40.0000 mg | ORAL_TABLET | Freq: Every day | ORAL | 3 refills | Status: DC

## 2017-06-01 LAB — CUP PACEART REMOTE DEVICE CHECK
Date Time Interrogation Session: 20190106233827
MDC IDC PG IMPLANT DT: 20161018

## 2017-06-19 ENCOUNTER — Ambulatory Visit (INDEPENDENT_AMBULATORY_CARE_PROVIDER_SITE_OTHER): Payer: 59 | Admitting: *Deleted

## 2017-06-19 DIAGNOSIS — I639 Cerebral infarction, unspecified: Secondary | ICD-10-CM

## 2017-06-20 NOTE — Progress Notes (Signed)
Carelink Summary Report / Loop Recorder 

## 2017-07-10 LAB — CUP PACEART REMOTE DEVICE CHECK
Implantable Pulse Generator Implant Date: 20161018
MDC IDC SESS DTM: 20190206004003

## 2017-07-23 ENCOUNTER — Ambulatory Visit (INDEPENDENT_AMBULATORY_CARE_PROVIDER_SITE_OTHER): Payer: 59 | Admitting: *Deleted

## 2017-07-23 DIAGNOSIS — I639 Cerebral infarction, unspecified: Secondary | ICD-10-CM | POA: Diagnosis not present

## 2017-07-23 NOTE — Progress Notes (Signed)
Carelink Summary Report / Loop Recorder 

## 2017-07-30 ENCOUNTER — Telehealth: Payer: Self-pay | Admitting: Family Medicine

## 2017-07-30 NOTE — Telephone Encounter (Signed)
See note.   Copied from [place] [phone]. Topic: General - Other >> Jul 30, 2017 11:54 AM Yvette Rack wrote: Reason for CRM: patient need a letter to get out of going to [employer] she states that she can't do it sitting all day and her nerves she need this letter by 08-27-17

## 2017-07-31 NOTE — Telephone Encounter (Signed)
Yes thanks  Please excuse from jury duty- patient with history of back pain radiating into legs with prolonged sitting which would make jury duty difficult for her.

## 2017-08-06 NOTE — Telephone Encounter (Signed)
Letter printed and mailed to patient.

## 2017-08-24 ENCOUNTER — Ambulatory Visit (INDEPENDENT_AMBULATORY_CARE_PROVIDER_SITE_OTHER): Payer: Medicare Other | Admitting: *Deleted

## 2017-08-24 DIAGNOSIS — I639 Cerebral infarction, unspecified: Secondary | ICD-10-CM

## 2017-08-27 NOTE — Progress Notes (Signed)
Carelink Summary Report / Loop Recorder 

## 2017-08-28 DIAGNOSIS — Z1389 Encounter for screening for other disorder: Secondary | ICD-10-CM | POA: Diagnosis not present

## 2017-08-28 DIAGNOSIS — Z01419 Encounter for gynecological examination (general) (routine) without abnormal findings: Secondary | ICD-10-CM | POA: Diagnosis not present

## 2017-08-28 DIAGNOSIS — Z6825 Body mass index (BMI) 25.0-25.9, adult: Secondary | ICD-10-CM | POA: Diagnosis not present

## 2017-08-28 DIAGNOSIS — E2839 Other primary ovarian failure: Secondary | ICD-10-CM | POA: Diagnosis not present

## 2017-08-29 ENCOUNTER — Other Ambulatory Visit: Payer: Self-pay | Admitting: Obstetrics and Gynecology

## 2017-08-29 DIAGNOSIS — E2839 Other primary ovarian failure: Secondary | ICD-10-CM

## 2017-08-30 ENCOUNTER — Other Ambulatory Visit: Payer: Self-pay | Admitting: Obstetrics and Gynecology

## 2017-08-30 DIAGNOSIS — Z1231 Encounter for screening mammogram for malignant neoplasm of breast: Secondary | ICD-10-CM

## 2017-08-30 LAB — CUP PACEART REMOTE DEVICE CHECK
Date Time Interrogation Session: 20190311003859
Implantable Pulse Generator Implant Date: 20161018

## 2017-09-04 ENCOUNTER — Ambulatory Visit: Payer: Self-pay | Admitting: *Deleted

## 2017-09-04 NOTE — Telephone Encounter (Signed)
Please advise 

## 2017-09-04 NOTE — Telephone Encounter (Signed)
Patient called wanting advice. Both she and her husband are 65 years of age. Questioning if they need an additional Measles vaccine. Attempted to reach patient, left VM. Called and spoke with Ellie who will f/u if there is a protocol for this recent issue involving immunization. Please advise: [phone]

## 2017-09-05 ENCOUNTER — Telehealth: Payer: Self-pay

## 2017-09-05 NOTE — Telephone Encounter (Signed)
Called patient to try and schedule an appointment for her Measles question. Patients husband wanted to know if there was a general answer for the vaccine. I told patient husband that Dr. Yong Channel wanted visits for those patients until he figure out what to do. He stated he would call back and schedule an appointment later.

## 2017-09-06 ENCOUNTER — Encounter: Payer: Self-pay | Admitting: Family Medicine

## 2017-09-06 LAB — URINALYSIS, DIPSTICK ONLY
BLOOD: NEGATIVE
Glucose: NEGATIVE
Nitrite: NEGATIVE
PROTEIN: NEGATIVE

## 2017-09-24 DIAGNOSIS — R6884 Jaw pain: Secondary | ICD-10-CM | POA: Diagnosis not present

## 2017-09-24 DIAGNOSIS — H6123 Impacted cerumen, bilateral: Secondary | ICD-10-CM | POA: Diagnosis not present

## 2017-09-25 ENCOUNTER — Other Ambulatory Visit: Payer: Self-pay

## 2017-09-25 DIAGNOSIS — F329 Major depressive disorder, single episode, unspecified: Secondary | ICD-10-CM

## 2017-09-25 DIAGNOSIS — F32A Depression, unspecified: Secondary | ICD-10-CM

## 2017-09-25 MED ORDER — SERTRALINE HCL 100 MG PO TABS: 100.0000 mg | ORAL_TABLET | Freq: Every day | ORAL | 0 refills | Status: DC

## 2017-09-26 ENCOUNTER — Ambulatory Visit (INDEPENDENT_AMBULATORY_CARE_PROVIDER_SITE_OTHER): Payer: Medicare Other | Admitting: *Deleted

## 2017-09-26 DIAGNOSIS — Z8673 Personal history of transient ischemic attack (TIA), and cerebral infarction without residual deficits: Secondary | ICD-10-CM

## 2017-09-26 LAB — CUP PACEART REMOTE DEVICE CHECK
Date Time Interrogation Session: 20190413013713
Implantable Pulse Generator Implant Date: 20161018

## 2017-09-27 NOTE — Progress Notes (Signed)
Carelink Summary Report / Loop Recorder 

## 2017-10-01 ENCOUNTER — Other Ambulatory Visit: Payer: Medicare Other

## 2017-10-02 ENCOUNTER — Ambulatory Visit
Admission: RE | Admit: 2017-10-02 | Discharge: 2017-10-02 | Disposition: A | Discharge status: Home / Self Care | Payer: 59 | Source: Ambulatory Visit | Attending: Obstetrics and Gynecology | Admitting: Obstetrics and Gynecology

## 2017-10-02 ENCOUNTER — Encounter: Payer: Self-pay | Admitting: Family Medicine

## 2017-10-02 DIAGNOSIS — M810 Age-related osteoporosis without current pathological fracture: Secondary | ICD-10-CM | POA: Insufficient documentation

## 2017-10-02 DIAGNOSIS — E2839 Other primary ovarian failure: Secondary | ICD-10-CM

## 2017-10-02 DIAGNOSIS — Z1231 Encounter for screening mammogram for malignant neoplasm of breast: Secondary | ICD-10-CM

## 2017-10-02 DIAGNOSIS — M8580 Other specified disorders of bone density and structure, unspecified site: Secondary | ICD-10-CM | POA: Insufficient documentation

## 2017-10-02 DIAGNOSIS — M8589 Other specified disorders of bone density and structure, multiple sites: Secondary | ICD-10-CM | POA: Diagnosis not present

## 2017-10-02 DIAGNOSIS — Z780 Asymptomatic menopausal state: Secondary | ICD-10-CM | POA: Diagnosis not present

## 2017-10-19 LAB — CUP PACEART REMOTE DEVICE CHECK
Implantable Pulse Generator Implant Date: 20161018
MDC IDC SESS DTM: 20190516020804

## 2017-10-30 ENCOUNTER — Ambulatory Visit (INDEPENDENT_AMBULATORY_CARE_PROVIDER_SITE_OTHER): Payer: Medicare Other | Admitting: *Deleted

## 2017-10-30 DIAGNOSIS — I639 Cerebral infarction, unspecified: Secondary | ICD-10-CM

## 2017-10-31 NOTE — Progress Notes (Signed)
Carelink Summary Report / Loop Recorder 

## 2017-12-03 ENCOUNTER — Ambulatory Visit (INDEPENDENT_AMBULATORY_CARE_PROVIDER_SITE_OTHER): Payer: Medicare Other | Admitting: *Deleted

## 2017-12-03 DIAGNOSIS — I639 Cerebral infarction, unspecified: Secondary | ICD-10-CM

## 2017-12-03 NOTE — Progress Notes (Signed)
Carelink Summary Report / Loop Recorder 

## 2017-12-05 LAB — CUP PACEART REMOTE DEVICE CHECK
Date Time Interrogation Session: 20190618020606
MDC IDC PG IMPLANT DT: 20161018

## 2017-12-14 ENCOUNTER — Other Ambulatory Visit: Payer: Self-pay | Admitting: Neurology

## 2017-12-14 DIAGNOSIS — I639 Cerebral infarction, unspecified: Secondary | ICD-10-CM

## 2017-12-22 ENCOUNTER — Other Ambulatory Visit: Payer: Self-pay | Admitting: Family Medicine

## 2017-12-22 DIAGNOSIS — F32A Depression, unspecified: Secondary | ICD-10-CM

## 2017-12-22 DIAGNOSIS — F329 Major depressive disorder, single episode, unspecified: Secondary | ICD-10-CM

## 2018-01-03 ENCOUNTER — Telehealth: Payer: Self-pay | Admitting: Neurology

## 2018-01-03 ENCOUNTER — Ambulatory Visit (INDEPENDENT_AMBULATORY_CARE_PROVIDER_SITE_OTHER): Payer: Medicare Other | Admitting: *Deleted

## 2018-01-03 DIAGNOSIS — Z8673 Personal history of transient ischemic attack (TIA), and cerebral infarction without residual deficits: Secondary | ICD-10-CM | POA: Diagnosis not present

## 2018-01-03 NOTE — Telephone Encounter (Addendum)
Pt called stating she is having an implant put in replace of a tooth and needing to know how long to stop clopidogrel (PLAVIX) 75 MG tablet

## 2018-01-03 NOTE — Telephone Encounter (Signed)
RN call patient that the clearance form should be fax from her dentist,or oral surgery. It needs to say what she is having done to require stop of her plavix. Rn gave pt fax number of [phone].

## 2018-01-04 NOTE — Progress Notes (Signed)
Carelink Summary Report / Loop Recorder 

## 2018-01-08 NOTE — Telephone Encounter (Signed)
Clearance form fax to [facility] at [phone].Form fax twice, and confirmed. Rn call to confirmed fax number,and it [phone]. Per Dr. Leonie Man clearance note pt to stop plavix 5 days prior to planned procedure with a small but acceptable peri-procedure risk of tia/stroke.

## 2018-01-16 ENCOUNTER — Other Ambulatory Visit: Payer: Self-pay | Admitting: Family Medicine

## 2018-01-17 LAB — CUP PACEART REMOTE DEVICE CHECK
Implantable Pulse Generator Implant Date: 20161018
MDC IDC SESS DTM: 20190721020821

## 2018-01-22 DIAGNOSIS — J301 Allergic rhinitis due to pollen: Secondary | ICD-10-CM | POA: Diagnosis not present

## 2018-01-22 DIAGNOSIS — J300 Vasomotor rhinitis: Secondary | ICD-10-CM | POA: Diagnosis not present

## 2018-01-22 DIAGNOSIS — J3089 Other allergic rhinitis: Secondary | ICD-10-CM | POA: Diagnosis not present

## 2018-01-29 ENCOUNTER — Other Ambulatory Visit: Payer: Self-pay | Admitting: Internal Medicine

## 2018-02-05 LAB — CUP PACEART REMOTE DEVICE CHECK
MDC IDC PG IMPLANT DT: 20161018
MDC IDC SESS DTM: 20190823034123

## 2018-02-06 ENCOUNTER — Ambulatory Visit (INDEPENDENT_AMBULATORY_CARE_PROVIDER_SITE_OTHER): Payer: Medicare Other | Admitting: *Deleted

## 2018-02-06 DIAGNOSIS — I639 Cerebral infarction, unspecified: Secondary | ICD-10-CM | POA: Diagnosis not present

## 2018-02-07 NOTE — Progress Notes (Signed)
Carelink Summary Report / Loop Recorder 

## 2018-02-11 ENCOUNTER — Other Ambulatory Visit: Payer: Self-pay | Admitting: Family Medicine

## 2018-02-11 LAB — CUP PACEART REMOTE DEVICE CHECK
Date Time Interrogation Session: 20190925114018
Implantable Pulse Generator Implant Date: 20161018

## 2018-02-12 ENCOUNTER — Other Ambulatory Visit: Payer: Self-pay | Admitting: Family Medicine

## 2018-02-12 DIAGNOSIS — F32A Depression, unspecified: Secondary | ICD-10-CM

## 2018-02-12 DIAGNOSIS — F329 Major depressive disorder, single episode, unspecified: Secondary | ICD-10-CM

## 2018-02-27 ENCOUNTER — Ambulatory Visit (INDEPENDENT_AMBULATORY_CARE_PROVIDER_SITE_OTHER): Payer: Medicare Other

## 2018-02-27 ENCOUNTER — Encounter: Payer: Self-pay | Admitting: Family Medicine

## 2018-02-27 DIAGNOSIS — Z23 Encounter for immunization: Secondary | ICD-10-CM | POA: Diagnosis not present

## 2018-03-01 ENCOUNTER — Encounter: Payer: Self-pay | Admitting: Family Medicine

## 2018-03-01 ENCOUNTER — Ambulatory Visit (INDEPENDENT_AMBULATORY_CARE_PROVIDER_SITE_OTHER): Payer: Medicare Other | Admitting: Family Medicine

## 2018-03-01 VITALS — BP 130/68 | HR 75 | Temp 98.3°F | Ht 61.0 in | Wt 143.6 lb

## 2018-03-01 DIAGNOSIS — I639 Cerebral infarction, unspecified: Secondary | ICD-10-CM

## 2018-03-01 DIAGNOSIS — I10 Essential (primary) hypertension: Secondary | ICD-10-CM | POA: Diagnosis not present

## 2018-03-01 DIAGNOSIS — E785 Hyperlipidemia, unspecified: Secondary | ICD-10-CM | POA: Diagnosis not present

## 2018-03-01 DIAGNOSIS — I69359 Hemiplegia and hemiparesis following cerebral infarction affecting unspecified side: Secondary | ICD-10-CM | POA: Insufficient documentation

## 2018-03-01 DIAGNOSIS — Z1211 Encounter for screening for malignant neoplasm of colon: Secondary | ICD-10-CM | POA: Diagnosis not present

## 2018-03-01 DIAGNOSIS — F411 Generalized anxiety disorder: Secondary | ICD-10-CM

## 2018-03-01 LAB — LIPID PANEL
CHOL/HDL RATIO: 2.9 (calc) (ref <5.0)
Cholesterol: 140 mg/dL (ref <200)
HDL: 48 mg/dL — ABNORMAL LOW (ref >50)
LDL Cholesterol (Calc): 74 mg/dL (calc)
NON-HDL CHOLESTEROL (CALC): 92 mg/dL (ref <130)
Triglycerides: 101 mg/dL (ref <150)

## 2018-03-01 LAB — COMPREHENSIVE METABOLIC PANEL
AG Ratio: 1.6 (calc) (ref 1.0–2.5)
ALBUMIN MSPROF: 4.1 g/dL (ref 3.6–5.1)
ALT: 23 U/L (ref 6–29)
AST: 19 U/L (ref 10–35)
Alkaline phosphatase (APISO): 135 U/L — ABNORMAL HIGH (ref 33–130)
BUN: 12 mg/dL (ref 7–25)
CHLORIDE: 103 mmol/L (ref 98–110)
CO2: 28 mmol/L (ref 20–32)
Calcium: 9.5 mg/dL (ref 8.6–10.4)
Creat: 0.62 mg/dL (ref 0.50–0.99)
GLOBULIN: 2.5 g/dL (ref 1.9–3.7)
GLUCOSE: 70 mg/dL (ref 65–99)
POTASSIUM: 4.4 mmol/L (ref 3.5–5.3)
Sodium: 140 mmol/L (ref 135–146)
Total Bilirubin: 0.4 mg/dL (ref 0.2–1.2)
Total Protein: 6.6 g/dL (ref 6.1–8.1)

## 2018-03-01 LAB — CBC
HCT: 41.8 % (ref 35.0–45.0)
HEMOGLOBIN: 14.2 g/dL (ref 11.7–15.5)
MCH: 30.3 pg (ref 27.0–33.0)
MCHC: 34 g/dL (ref 32.0–36.0)
MCV: 89.1 fL (ref 80.0–100.0)
MPV: 9.9 fL (ref 7.5–12.5)
Platelets: 281 10*3/uL (ref 140–400)
RBC: 4.69 10*6/uL (ref 3.80–5.10)
RDW: 12.2 % (ref 11.0–15.0)
WBC: 4.8 10*3/uL (ref 3.8–10.8)

## 2018-03-01 MED ORDER — SERTRALINE HCL 100 MG PO TABS: 100.0000 mg | ORAL_TABLET | Freq: Every day | ORAL | 1 refills | Status: DC

## 2018-03-01 MED ORDER — HYDROCHLOROTHIAZIDE 25 MG PO TABS: 12.5000 mg | ORAL_TABLET | Freq: Every day | ORAL | 1 refills | Status: DC

## 2018-03-01 NOTE — Addendum Note (Signed)
Addended by: Frutoso Chase A on: 03/01/2018 03:28 PM   Modules accepted: Orders

## 2018-03-01 NOTE — Addendum Note (Signed)
Addended by: Frutoso Chase A on: 03/01/2018 03:29 PM   Modules accepted: Orders

## 2018-03-01 NOTE — Progress Notes (Signed)
Subjective:  Brianna Simon is a 67 y.o. year old very pleasant female patient who presents for/with See problem oriented charting ROS- No chest pain. No  shortness of breath except if very extended walks like at the zoo. No headache or blurry vision.    Past Medical History-  Patient Active Problem List   Diagnosis Date Noted  . Cryptogenic stroke ([place]) 03/01/2015    Priority: High  . History of CVA (cerebrovascular accident) 02/22/2010    Priority: High  . Osteopenia 10/02/2017    Priority: Medium  . Former smoker 12/09/2014    Priority: Medium  . Anxiety state 10/27/2014    Priority: Medium  . Hyperlipidemia 02/22/2010    Priority: Medium  . Essential hypertension 12/07/2006    Priority: Medium  . Allergic rhinitis 10/27/2014    Priority: Low  . Esophageal reflux 12/31/2006    Priority: Low  . Insomnia 12/31/2006    Priority: Low  . MVP (mitral valve prolapse) 12/10/2006    Priority: Low  . Hemiplegia affecting dominant side, 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[place]) 03/01/2018  . Benign paroxysmal positional vertigo 11/13/2016  . Low back pain 09/26/2016  . Dysarthria 03/02/2015    Medications- reviewed and updated Current Outpatient Medications  Medication Sig Dispense Refill  . atorvastatin (LIPITOR) 40 MG tablet Take 1 tablet (40 mg total) by mouth daily at 6 PM. 90 tablet 3  . azelastine (ASTELIN) 0.1 % nasal spray INSTILL 1 SPRAY IN EACH NOSTRIL TWICE A DAY  5  . cholecalciferol (VITAMIN D) 1000 units tablet Take 1,000 Units by mouth daily.    . clopidogrel (PLAVIX) 75 MG tablet TAKE 1 TABLET BY MOUTH DAILY 90 tablet 3  . fexofenadine (ALLEGRA) 180 MG tablet Take 180 mg by mouth daily.    . fluticasone (FLONASE) 50 MCG/ACT nasal spray INSTILL 1 SPRAY INTO EACH NOSTRIL EVERY DAY  5  . hydrochlorothiazide (HYDRODIURIL) 25 MG tablet TAKE 1/2 TABLET BY MOUTH EVERY DAY 15 tablet 0  . ipratropium (ATROVENT) 0.06 % nasal spray INSTILL 2 SPRAYS IN EACH NOSTRIL 3 TIMES DAILY AS NEEDED  5  .  montelukast (SINGULAIR) 10 MG tablet Take 1 tablet by mouth at bedtime.  5  . ranitidine (ZANTAC) 150 MG tablet Take 150 mg by mouth daily.    . sertraline (ZOLOFT) 100 MG tablet TAKE 1 TABLET BY MOUTH EVERY DAY 30 tablet 0   Objective: BP 130/68 (BP Location: Left Arm, Patient Position: Sitting, Cuff Size: Large)   Pulse 75   Temp 98.3 F (36.8 C) (Oral)   Ht 5\' 1"  (1.549 m)   Wt 143 lb 9.6 oz (65.1 kg)   SpO2 93%   BMI 27.13 kg/m  Gen: NAD, resting comfortably CV: RRR no murmurs rubs or gallops Lungs: CTAB no crackles, wheeze, rhonchi Abdomen: soft/nontender/nondistended/normal bowel sounds.  Ext: no edema Skin: warm, dry Neuro: decreased sensation to monofilament on right hand compared to left  Assessment/Plan:  Anxiety state S: Patient's anxiety remains reasonably controlled on Zoloft 100 mg.  She denies suicidal ideation.  Her PHQ2 is 0 . Still some stressors.  A/P: Stable-continue current medications  Essential hypertension S: controlled on hydrochlorothiazide 12.5 mg BP Readings from Last 3 Encounters:  03/01/18 130/68  05/03/17 125/75  11/13/16 122/80  A/P: We discussed blood pressure goal of <140/90. Continue current meds  Hyperlipidemia S:  controlled on atorvastatin 40 mg in the past.  Remains on Plavix for stroke prevention given history of stroke Lab Results  Component Value Date  CHOL 156 02/27/2015   HDL 54 02/27/2015   LDLCALC 86 02/27/2015   LDLDIRECT 67 08/03/2015   TRIG 81 02/27/2015   CHOLHDL 2.9 02/27/2015   A/P: Need to update lipid panel- hoping to continue medication as long as LDL under 70  Hemiplegia affecting dominant side, 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[place]) S: Right handed. Residual numbness right hand and right cheek. Harder to pick things up A/P: stable. Continue stroke prevention  Future Appointments  Date Time Provider [place]  03/11/2018  9:25 AM [facility] DEVICE REMOTES [facility]  05/27/2018  3:00 PM Garvin Fila,  MD [facility] None   Return in about 6 months (around 08/31/2018) for physical.  Lab/Order associations: Anxiety state - Plan: sertraline (ZOLOFT) 100 MG tablet  Essential hypertension  Hyperlipidemia, unspecified hyperlipidemia type - Plan: CBC, Comprehensive metabolic panel, Lipid panel  Hemiplegia affecting dominant side, 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[place])  Meds ordered this encounter  Medications  . sertraline (ZOLOFT) 100 MG tablet    Sig: Take 1 tablet (100 mg total) by mouth daily.    Dispense:  90 tablet    Refill:  1  . hydrochlorothiazide (HYDRODIURIL) 25 MG tablet    Sig: Take 0.5 tablets (12.5 mg total) by mouth daily.    Dispense:  46 tablet    Refill:  1   Return precautions advised.  Garret Reddish, MD

## 2018-03-01 NOTE — Addendum Note (Signed)
Addended by: Gwenyth Ober R on: 03/01/2018 03:25 PM   Modules accepted: Orders

## 2018-03-01 NOTE — Assessment & Plan Note (Signed)
S: Patient's anxiety remains reasonably controlled on Zoloft 100 mg.  She denies suicidal ideation.  Her PHQ2 is 0 . Still some stressors.  A/P: Stable-continue current medications

## 2018-03-01 NOTE — Assessment & Plan Note (Signed)
S:  controlled on atorvastatin 40 mg in the past.  Remains on Plavix for stroke prevention given history of stroke Lab Results  Component Value Date   CHOL 156 02/27/2015   HDL 54 02/27/2015   LDLCALC 86 02/27/2015   LDLDIRECT 67 08/03/2015   TRIG 81 02/27/2015   CHOLHDL 2.9 02/27/2015   A/P: Need to update lipid panel- hoping to continue medication as long as LDL under 70

## 2018-03-01 NOTE — Patient Instructions (Addendum)
Health Maintenance Due  Topic Date Due  . COLONOSCOPY - Brianna Simon will set you up with cologuard 06/30/2017  . PNA vac Low Risk Adult (1 of 2 - PCV13)- prevnar shot next visit 11/16/2017   No changes today  Please stop by lab before you go

## 2018-03-01 NOTE — Assessment & Plan Note (Signed)
S: controlled on hydrochlorothiazide 12.5 mg BP Readings from Last 3 Encounters:  03/01/18 130/68  05/03/17 125/75  11/13/16 122/80  A/P: We discussed blood pressure goal of <140/90. Continue current meds

## 2018-03-01 NOTE — Assessment & Plan Note (Signed)
S: Right handed. Residual numbness right hand and right cheek. Harder to pick things up A/P: stable. Continue stroke prevention

## 2018-03-11 ENCOUNTER — Ambulatory Visit (INDEPENDENT_AMBULATORY_CARE_PROVIDER_SITE_OTHER): Payer: Medicare Other | Admitting: *Deleted

## 2018-03-11 DIAGNOSIS — I639 Cerebral infarction, unspecified: Secondary | ICD-10-CM | POA: Diagnosis not present

## 2018-03-11 NOTE — Progress Notes (Signed)
Carelink Summary Report / Loop Recorder 

## 2018-03-21 DIAGNOSIS — Z1211 Encounter for screening for malignant neoplasm of colon: Secondary | ICD-10-CM | POA: Diagnosis not present

## 2018-03-21 LAB — COLOGUARD: Cologuard: NEGATIVE

## 2018-04-01 ENCOUNTER — Encounter: Payer: Self-pay | Admitting: Family Medicine

## 2018-04-03 LAB — CUP PACEART REMOTE DEVICE CHECK
MDC IDC PG IMPLANT DT: 20161018
MDC IDC SESS DTM: 20191028120653

## 2018-04-15 ENCOUNTER — Ambulatory Visit (INDEPENDENT_AMBULATORY_CARE_PROVIDER_SITE_OTHER): Payer: Medicare Other

## 2018-04-15 DIAGNOSIS — I639 Cerebral infarction, unspecified: Secondary | ICD-10-CM | POA: Diagnosis not present

## 2018-04-15 NOTE — Progress Notes (Signed)
Carelink Summary Report / Loop Recorder 

## 2018-05-10 LAB — CUP PACEART REMOTE DEVICE CHECK
Implantable Pulse Generator Implant Date: 20161018
MDC IDC SESS DTM: 20191202094007

## 2018-05-13 ENCOUNTER — Ambulatory Visit: Payer: 59

## 2018-05-13 ENCOUNTER — Ambulatory Visit (INDEPENDENT_AMBULATORY_CARE_PROVIDER_SITE_OTHER): Payer: Medicare Other | Admitting: Podiatry

## 2018-05-13 ENCOUNTER — Ambulatory Visit: Payer: 59 | Admitting: Neurology

## 2018-05-13 ENCOUNTER — Encounter: Payer: Self-pay | Admitting: Podiatry

## 2018-05-13 VITALS — BP 120/66 | HR 66

## 2018-05-13 DIAGNOSIS — I69359 Hemiplegia and hemiparesis following cerebral infarction affecting unspecified side: Secondary | ICD-10-CM | POA: Diagnosis not present

## 2018-05-13 DIAGNOSIS — M7750 Other enthesopathy of unspecified foot: Secondary | ICD-10-CM

## 2018-05-13 DIAGNOSIS — M79672 Pain in left foot: Secondary | ICD-10-CM

## 2018-05-13 DIAGNOSIS — D689 Coagulation defect, unspecified: Secondary | ICD-10-CM

## 2018-05-13 DIAGNOSIS — S90121A Contusion of right lesser toe(s) without damage to nail, initial encounter: Secondary | ICD-10-CM | POA: Diagnosis not present

## 2018-05-13 DIAGNOSIS — M7752 Other enthesopathy of left foot: Secondary | ICD-10-CM | POA: Diagnosis not present

## 2018-05-13 DIAGNOSIS — Q828 Other specified congenital malformations of skin: Secondary | ICD-10-CM | POA: Diagnosis not present

## 2018-05-13 NOTE — Progress Notes (Signed)
Subjective:  Patient ID: Brianna Simon, female    DOB: [birth year],  MRN: [id number]  Chief Complaint  Patient presents with  . Foot Problem    fell a little over a week ago having pain on the outside of the right foot   . Plantar Warts    possible wart under 5th toe left     67 y.o. female presents with the above complaint.  Wishes today first complains of pain in the outside the left foot with a small area of skin hardening that she believes to be a wart also complains of pain to the right foot about the fifth toe states that she injured the area about a week ago by falling and fell directly on the side.  Reports history of stroke for which she currently takes an anticoagulant reports deficits to the right side but not any deficits to the right foot.   Review of Systems: Negative except as noted in the HPI. Denies N/V/F/Ch.  Past Medical History:  Diagnosis Date  . Anxiety   . Arthritis    hips, hands, lower back - otc med prn  . Cancer ([place])    skin cancer left wrist and left chest  . GERD (gastroesophageal reflux disease)   . Hx: UTI (urinary tract infection)   . Hyperlipidemia   . Hypertension   . Mitral valve prolapse    does not cause patient any problems  . Seasonal allergies   . Stroke [facility]) 2011   mild  . SVD (spontaneous vaginal delivery)    x 1 - gave up for adoption    Current Outpatient Medications:  .  atorvastatin (LIPITOR) 40 MG tablet, Take 1 tablet (40 mg total) by mouth daily at 6 PM., Disp: 90 tablet, Rfl: 3 .  azelastine (ASTELIN) 0.1 % nasal spray, INSTILL 1 SPRAY IN EACH NOSTRIL TWICE A DAY, Disp: , Rfl: 5 .  cholecalciferol (VITAMIN D) 1000 units tablet, Take 1,000 Units by mouth daily., Disp: , Rfl:  .  clopidogrel (PLAVIX) 75 MG tablet, TAKE 1 TABLET BY MOUTH DAILY, Disp: 90 tablet, Rfl: 3 .  fexofenadine (ALLEGRA) 180 MG tablet, Take 180 mg by mouth daily., Disp: , Rfl:  .  fluticasone (FLONASE) 50 MCG/ACT nasal spray, INSTILL 1 SPRAY INTO  EACH NOSTRIL EVERY DAY, Disp: , Rfl: 5 .  hydrochlorothiazide (HYDRODIURIL) 25 MG tablet, Take 0.5 tablets (12.5 mg total) by mouth daily., Disp: 46 tablet, Rfl: 1 .  ipratropium (ATROVENT) 0.06 % nasal spray, INSTILL 2 SPRAYS IN EACH NOSTRIL 3 TIMES DAILY AS NEEDED, Disp: , Rfl: 5 .  montelukast (SINGULAIR) 10 MG tablet, Take 1 tablet by mouth at bedtime., Disp: , Rfl: 5 .  sertraline (ZOLOFT) 100 MG tablet, Take 1 tablet (100 mg total) by mouth daily., Disp: 90 tablet, Rfl: 1 .  ranitidine (ZANTAC) 150 MG tablet, Take 150 mg by mouth daily., Disp: , Rfl:   Social History   Tobacco Use  Smoking Status Former Smoker  . Packs/day: 2.00  . Years: 30.00  . Pack years: 60.00  . Types: Cigarettes  . Last attempt to quit: 03/17/2003  . Years since quitting: 15.1  Smokeless Tobacco Never Used    Allergies  Allergen Reactions  . Citalopram Hydrobromide     REACTION: blurred vision  . Codeine Phosphate     Liquid formulation only bothers her; tolerates percocet fine.  REACTION: nausea,vomiting  . Nitrofurantoin     REACTION: itching  . Nsaids Other (See Comments)  Elevated liver panel  . Sulfamethoxazole-Trimethoprim     REACTION: itching   Objective:   Vitals:   05/13/18 1532  BP: 120/66  Pulse: 66   There is no height or weight on file to calculate BMI. Constitutional Well developed. Well nourished.  Vascular Dorsalis pedis pulses palpable bilaterally. Posterior tibial pulses palpable bilaterally. Capillary refill normal to all digits.  No cyanosis or clubbing noted. Pedal hair growth normal.  Neurologic Normal speech. Oriented to person, place, and time. Epicritic sensation to light touch grossly present bilaterally.  Dermatologic Nails well groomed and normal in appearance. No open wounds. Keratosis left fifth MPJ  Orthopedic: Normal joint ROM without pain or crepitus bilaterally. No visible deformities. Palpation about the fifth MPJ bilaterally, contusion about  the right fifth MPJ   Radiographs: Taken and reviewed no acute fracture dislocations Assessment:   1. Capsulitis of metatarsophalangeal (MTP) joint   2. Porokeratosis   3. Coagulation defect ([place])   4. 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 Contusion of fifth toe of right foot, initial encounter    Plan:  Patient was evaluated and treated and all questions answered.  Contusion right fifth MPJ -X-rays taken without any evidence of fracture -Discussed avoiding close toed shoes until the pain resolves -Icing elevating compression as needed  Left fifth MPJ capsulitis, porokeratosis -Injection delivered as below -Lesion palliatively pared  Procedure: Joint Injection Location: Left 5th MPJ joint Skin Prep: Alcohol. Injectate: 0.5 cc 1% lidocaine plain, 0.5 cc dexamethasone phosphate. Disposition: Patient tolerated procedure well. Injection site dressed with a band-aid.  Procedure: Paring of Lesion Rationale: painful hyperkeratotic lesion Type of Debridement: manual, sharp debridement. Instrumentation: 312 blade Number of Lesions: 1   History of stroke -No residual lower extremity deficits  Return in about 3 weeks (around 06/03/2018) for Capsulitis, Left.

## 2018-05-16 ENCOUNTER — Telehealth: Payer: Self-pay

## 2018-05-16 NOTE — Telephone Encounter (Signed)
Dr.Athar who is the work in pm doctor at [facility] today did clearance form. Dr.SEthi is out of the office till next week. Clearance was done to stop Plavix 5 days prior to planned procedure with a small but acceptable peri-procedural risk of TIA/Stroke. Pt can resume Plavix post operative when considered safe.  Form fax to [phone]. Form fax twice and confirmed.

## 2018-05-16 NOTE — Telephone Encounter (Signed)
Left vm for [facility] at [phone] to see if patient has already stop Plavix.

## 2018-05-16 NOTE — Telephone Encounter (Signed)
Rn receive fax from [place]. IT was fax 05/13/2018. Our office was closed 05/14/2018 and May 15, 2018. Pt having laser surgery, and bone grafting done on 05/20/2018. Pt is on Plavix.No strokes since 2016

## 2018-05-20 ENCOUNTER — Ambulatory Visit (INDEPENDENT_AMBULATORY_CARE_PROVIDER_SITE_OTHER): Payer: Medicare Other

## 2018-05-20 DIAGNOSIS — I639 Cerebral infarction, unspecified: Secondary | ICD-10-CM

## 2018-05-20 LAB — CUP PACEART REMOTE DEVICE CHECK
Implantable Pulse Generator Implant Date: 20161018
MDC IDC SESS DTM: 20200104093549

## 2018-05-21 NOTE — Progress Notes (Signed)
Carelink Summary Report / Loop Recorder 

## 2018-05-27 ENCOUNTER — Ambulatory Visit: Payer: 59 | Admitting: Neurology

## 2018-05-28 ENCOUNTER — Ambulatory Visit: Payer: 59 | Admitting: Neurology

## 2018-05-28 DIAGNOSIS — Z85828 Personal history of other malignant neoplasm of skin: Secondary | ICD-10-CM | POA: Diagnosis not present

## 2018-05-28 DIAGNOSIS — L821 Other seborrheic keratosis: Secondary | ICD-10-CM | POA: Diagnosis not present

## 2018-05-28 DIAGNOSIS — L570 Actinic keratosis: Secondary | ICD-10-CM | POA: Diagnosis not present

## 2018-05-28 DIAGNOSIS — L814 Other melanin hyperpigmentation: Secondary | ICD-10-CM | POA: Diagnosis not present

## 2018-05-28 DIAGNOSIS — D1801 Hemangioma of skin and subcutaneous tissue: Secondary | ICD-10-CM | POA: Diagnosis not present

## 2018-05-29 ENCOUNTER — Ambulatory Visit (INDEPENDENT_AMBULATORY_CARE_PROVIDER_SITE_OTHER): Payer: Medicare Other | Admitting: Neurology

## 2018-05-29 ENCOUNTER — Encounter: Payer: Self-pay | Admitting: Neurology

## 2018-05-29 VITALS — BP 120/66 | HR 67 | Ht 61.0 in | Wt 143.4 lb

## 2018-05-29 DIAGNOSIS — I639 Cerebral infarction, unspecified: Secondary | ICD-10-CM

## 2018-05-29 DIAGNOSIS — I6990 Unspecified sequelae of unspecified cerebrovascular disease: Secondary | ICD-10-CM

## 2018-05-29 NOTE — Progress Notes (Signed)
[facility] [address] [place]. [place] [address] [phone]       OFFICE FOLLOW-UP NOTE  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 Mild chronic lower extremities paresthesias  Update 02/10/2013  She returns for f/u after last visit on 01/16/2012.She remains stable without any new stroke or TIA signs and symptoms.She is tolerating aspirin well without side effects and BP is well controlled and last lipid profile in February this year was fine. She has mild paresthesias in her feet which appear unchanged from last visit.She has minor nasal bleeds related to her sinuses after sneezing but no unprovoked bleeding.Shehas intermittent occasional blurred vision and dizziness without vertigo. UPDATE 02/09/2014 : She returns for followup of the last visit 1 year ago. She continues to do well and has remained stroke symptom free since January 2011. She continues to take aspirin and states her blood pressure is under good control though it is slightly elevated at 148/79 in office today. She had her last lipid profile checked less than a year ago and was fine. She is tolerating her medications well without any side effects. She had it last carotid ultrasound and also a year ago. She had no new complaints today. 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 Patient had a loop recorder inserted but so far atrial fibrillation has not been found. LDL cholesterol is 86. HbA1c was 5.5. Hypercoagulable workup was all negative. TEE done in 2011 intracranial MRA and extracranial  carotid Dopplers did not show significant stenosis on the left side. She is tolerating the  RESPECT ESUSstudy medications without any significant bruising, bleeding or other side effects. Blood pressure is well controlled and it is 120/70 today. She is tolerating Lipitor without myalgias or arthralgias or other side effects. She continues to have occasional speech difficulties as well as tingling and numbness in the right side of the face and tongue as well as diminished taste. She is currently doing occupational therapy for her right hand. She's had trouble sleeping with some insomnia for sleep onset but 1 she sleep she can sleep 7-8 hours. Is requesting medication to help her sleep earlier. Update 05/03/2017 : She returns for follow-up after last visit with me when necessary of ago. She has completed parts patient in the respect cases trial. She is on Plavix now and tolerating it well without bruising or bleeding. She states her blood pressure is well controlled today it is 125/75. She is tolerating Lipitor well without muscle aches and pains. She cannot admit the last time lipid profile was checked. The patient was admitted to the hospital in June 2018 with dizziness which was felt to be due to benign paroxysmal positional vertigo. She states she is doing well and no longer has any vertigo or dizziness. She states she is eating healthy but has not lost any weight but neither has she gained any weight. She is quite active. She has not had follow-up carotid ultrasound done for more than 2 years. Update 05/28/2018 ; she returns for follow-up after last visit a year ago.  She continues to do well and has not had a recurrent stroke  or TIA symptoms for no more than 3 years.  She remains on Plavix which is tolerating well without bleeding and only minor bruising.  She did have a fall during Christmas when she tripped and fell and sustained minor bruising of her ankles and feet but no significant bleeding or fractures.   She states her blood pressure is well controlled and today it is 120/76.  She is tolerating Lipitor well without side effects and had follow-up lipid profile checked by primary physician 2 months ago and it was slightly high and patient has modified her diet since then.  She has not had a follow-up carotid ultrasound done in couple of years now.  She has no new complaints. ROS:   14 system review of systems is positive for   hearing loss, ringing in the ears, insomnia, snoring, allergies, bruising, numbness, skin moles and all other systems negative, all other systems negative   PMH:  Past Medical History:  Diagnosis Date  . Anxiety   . Arthritis    hips, hands, lower back - otc med prn  . Cancer ([place])    skin cancer left wrist and left chest  . GERD (gastroesophageal reflux disease)   . Hx: UTI (urinary tract infection)   . Hyperlipidemia   . Hypertension   . Mitral valve prolapse    does not cause patient any problems  . Seasonal allergies   . Stroke [facility]) 2011   mild  . SVD (spontaneous vaginal delivery)    x 1 - gave up for adoption    Social History:  Social History   Socioeconomic History  . Marital status: Married    Spouse name: Not on file  . Number of children: Not on file  . Years of education: Not on file  . Highest education level: Not on file  Occupational History  . Occupation: [employer]    Employer: [employer]  Social Needs  . Financial resource strain: Not on file  . Food insecurity:    Worry: Not on file    Inability: Not on file  . Transportation needs:    Medical: Not on file    Non-medical: Not on file  Tobacco Use  . Smoking status: Former Smoker    Packs/day: 2.00    Years: 30.00    Pack years: 60.00    Types: Cigarettes    Last attempt to quit: 03/17/2003    Years since quitting: 15.2  . Smokeless tobacco: Never Used  Substance and Sexual Activity  . Alcohol use: Yes    Alcohol/week: 3.0 standard drinks    Types: 1 Glasses of  wine, 1 Cans of beer, 1 Shots of liquor per week    Comment: occasionally  . Drug use: No  . Sexual activity: Yes    Birth control/protection: Post-menopausal  Lifestyle  . Physical activity:    Days per week: Not on file    Minutes per session: Not on file  . Stress: Not on file  Relationships  . Social connections:    Talks on phone: Not on file    Gets together: Not on file    Attends religious service: Not on file    Active member of club or organization: Not on file    Attends meetings of clubs or organizations: Not on file    Relationship status: Not on file  . Intimate partner violence:    Fear of current or ex partner: Not on file    Emotionally  abused: Not on file    Physically abused: Not on file    Forced sexual activity: Not on file  Other Topics Concern  . Not on file  Social History Narrative   Married. No children. Husband patient of Dr. 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       Hobbies: dancing, time out with husband, enjoys concerts    Medications:   Current Outpatient Medications on File Prior to Visit  Medication Sig Dispense Refill  . atorvastatin (LIPITOR) 40 MG tablet Take 1 tablet (40 mg total) by mouth daily at 6 PM. 90 tablet 3  . azelastine (ASTELIN) 0.1 % nasal spray INSTILL 1 SPRAY IN EACH NOSTRIL TWICE A DAY  5  . cholecalciferol (VITAMIN D) 1000 units tablet Take 1,000 Units by mouth daily.    . clopidogrel (PLAVIX) 75 MG tablet TAKE 1 TABLET BY MOUTH DAILY 90 tablet 3  . fexofenadine (ALLEGRA) 180 MG tablet Take 180 mg by mouth daily.    . fluticasone (FLONASE) 50 MCG/ACT nasal spray INSTILL 1 SPRAY INTO EACH NOSTRIL EVERY DAY  5  . hydrochlorothiazide (HYDRODIURIL) 25 MG tablet Take 0.5 tablets (12.5 mg total) by mouth daily. 46 tablet 1  . ipratropium (ATROVENT) 0.06 % nasal spray INSTILL 2 SPRAYS IN EACH NOSTRIL 3 TIMES DAILY AS NEEDED  5  . lansoprazole (PREVACID) 15 MG capsule Take 15 mg by mouth daily at 12  noon.    . montelukast (SINGULAIR) 10 MG tablet Take 1 tablet by mouth at bedtime.  5  . sertraline (ZOLOFT) 100 MG tablet Take 1 tablet (100 mg total) by mouth daily. 90 tablet 1   No current facility-administered medications on file prior to visit.     Allergies:   Allergies  Allergen Reactions  . Citalopram Hydrobromide     REACTION: blurred vision  . Codeine Phosphate     Liquid formulation only bothers her; tolerates percocet fine.  REACTION: nausea,vomiting  . Nitrofurantoin     REACTION: itching  . Nsaids Other (See Comments)    Elevated liver panel  . Sulfamethoxazole-Trimethoprim     REACTION: itching   Vitals:   05/29/18 1407  BP: 120/66  Pulse: 67     Physical Exam General: well developed, well nourished, seated, in no evident distress Head: head normocephalic and atraumatic.   Neck: supple with no carotid or supraclavicular bruits Cardiovascular: regular rate and rhythm, no murmurs Musculoskeletal: no deformity Skin:  no rash/petichiae Vascular:  Normal pulses all extremities Vitals:   05/29/18 1407  BP: 120/66  Pulse: 67    Neurologic Exam Mental Status: Awake and fully alert. Oriented to place and time. Recent and remote memory intact. Attention span, concentration and fund of knowledge appropriate. Mood and affect appropriate.  Cranial Nerves: Fundoscopic exam not done. Pupils equal, briskly reactive to light. Extraocular movements full without nystagmus. Visual fields full to confrontation. Hearing intact. Facial sensation intact. Face, tongue, palate moves normally and symmetrically.  Motor: Normal bulk and tone. Normal strength in all tested extremity muscles. Sensory.: intact to touch and pinprick and vibratory sensation. Except for right upper and lower lip where it is diminished Coordination: Rapid alternating movements normal in all extremities. Finger-to-nose and heel-to-shin performed accurately bilaterally. Gait and Station: Arises from chair  without difficulty. Stance is normal. Gait demonstrates normal stride length and balance . Able to heel, toe and tandem walk without difficulty.  Reflexes: 1+ and symmetric. Toes downgoing.  ASSESSMENT: 17 year Caucasian lady with remote small left pareital embolic infarcts in January 2011 and October 2016 without definite identified source of embolism.Vascular risk factors of Hypertension and Hyperlipidimia.   She continues to do well.    PLAN: I had a long d/w patient about her remote stroke, risk for recurrent stroke/TIAs, personally independently reviewed imaging studies and stroke evaluation results and answered questions.Continue  Plavix  for secondary stroke prevention and maintain strict control of hypertension with blood pressure goal below 130/90, diabetes with hemoglobin A1c goal below 6.5% and lipids with LDL cholesterol goal below 70 mg/dL. I also advised the patient to eat a healthy diet with plenty of whole grains, cereals, fruits and vegetables, exercise regularly and maintain ideal body weight. Check f/u carotid dopplers. Followup in the future with me only as necessary. Greater than 50% time during this 25 minute visit was spent on counseling and coordination of care about her cryptogenic stroke and discussion about stroke prevention Followup in the future with my nurse practitioner in one year or call earlier if necessary.  Antony Contras, MD

## 2018-05-29 NOTE — Patient Instructions (Signed)
I had a long d/w patient about her remote stroke, risk for recurrent stroke/TIAs, personally independently reviewed imaging studies and stroke evaluation results and answered questions.Continue  Plavix  for secondary stroke prevention and maintain strict control of hypertension with blood pressure goal below 130/90, diabetes with hemoglobin A1c goal below 6.5% and lipids with LDL cholesterol goal below 70 mg/dL. I also advised the patient to eat a healthy diet with plenty of whole grains, cereals, fruits and vegetables, exercise regularly and maintain ideal body weight. Check f/u carotid dopplers. Followup in the future with me only as necessary.

## 2018-06-01 ENCOUNTER — Other Ambulatory Visit: Payer: Self-pay | Admitting: Family Medicine

## 2018-06-03 NOTE — Telephone Encounter (Signed)
Last OV 03/01/2018 Last refill 03/01/2018 #46/1 Next OV 09/02/2018

## 2018-06-04 ENCOUNTER — Ambulatory Visit (INDEPENDENT_AMBULATORY_CARE_PROVIDER_SITE_OTHER): Payer: Medicare Other | Admitting: Podiatry

## 2018-06-04 DIAGNOSIS — M7750 Other enthesopathy of unspecified foot: Secondary | ICD-10-CM

## 2018-06-04 DIAGNOSIS — B351 Tinea unguium: Secondary | ICD-10-CM | POA: Diagnosis not present

## 2018-06-04 DIAGNOSIS — M7751 Other enthesopathy of right foot: Secondary | ICD-10-CM

## 2018-06-04 DIAGNOSIS — D689 Coagulation defect, unspecified: Secondary | ICD-10-CM | POA: Diagnosis not present

## 2018-06-04 NOTE — Progress Notes (Signed)
Subjective:  Patient ID: Brianna Simon, female    DOB: [birth year],  MRN: [id number]  Chief Complaint  Patient presents with  . capsulitis    F?U L capsulitis and porokeratosis Pt.s tates," doing pretty good no pain at all." Tx: none    68 y.o. female presents with the above complaint. States the left is doing great no pain. Still having pain on the right side.  Requests nail care today.  Review of Systems: Negative except as noted in the HPI. Denies N/V/F/Ch.  Past Medical History:  Diagnosis Date  . Anxiety   . Arthritis    hips, hands, lower back - otc med prn  . Cancer ([place])    skin cancer left wrist and left chest  . GERD (gastroesophageal reflux disease)   . Hx: UTI (urinary tract infection)   . Hyperlipidemia   . Hypertension   . Mitral valve prolapse    does not cause patient any problems  . Seasonal allergies   . Stroke [facility]) 2011   mild  . SVD (spontaneous vaginal delivery)    x 1 - gave up for adoption    Current Outpatient Medications:  .  atorvastatin (LIPITOR) 40 MG tablet, Take 1 tablet (40 mg total) by mouth daily at 6 PM., Disp: 90 tablet, Rfl: 3 .  azelastine (ASTELIN) 0.1 % nasal spray, INSTILL 1 SPRAY IN EACH NOSTRIL TWICE A DAY, Disp: , Rfl: 5 .  cholecalciferol (VITAMIN D) 1000 units tablet, Take 1,000 Units by mouth daily., Disp: , Rfl:  .  clopidogrel (PLAVIX) 75 MG tablet, TAKE 1 TABLET BY MOUTH DAILY, Disp: 90 tablet, Rfl: 3 .  fexofenadine (ALLEGRA) 180 MG tablet, Take 180 mg by mouth daily., Disp: , Rfl:  .  fluticasone (FLONASE) 50 MCG/ACT nasal spray, INSTILL 1 SPRAY INTO EACH NOSTRIL EVERY DAY, Disp: , Rfl: 5 .  hydrochlorothiazide (HYDRODIURIL) 25 MG tablet, TAKE 1/2 TABLET BY MOUTH EVERY DAY, Disp: 45 tablet, Rfl: 1 .  ipratropium (ATROVENT) 0.06 % nasal spray, INSTILL 2 SPRAYS IN EACH NOSTRIL 3 TIMES DAILY AS NEEDED, Disp: , Rfl: 5 .  lansoprazole (PREVACID) 15 MG capsule, Take 15 mg by mouth daily at 12 noon., Disp: , Rfl:  .   montelukast (SINGULAIR) 10 MG tablet, Take 1 tablet by mouth at bedtime., Disp: , Rfl: 5 .  sertraline (ZOLOFT) 100 MG tablet, Take 1 tablet (100 mg total) by mouth daily., Disp: 90 tablet, Rfl: 1  Social History   Tobacco Use  Smoking Status Former Smoker  . Packs/day: 2.00  . Years: 30.00  . Pack years: 60.00  . Types: Cigarettes  . Last attempt to quit: 03/17/2003  . Years since quitting: 15.2  Smokeless Tobacco Never Used    Allergies  Allergen Reactions  . Citalopram Hydrobromide     REACTION: blurred vision  . Codeine Phosphate     Liquid formulation only bothers her; tolerates percocet fine.  REACTION: nausea,vomiting  . Nitrofurantoin     REACTION: itching  . Nsaids Other (See Comments)    Elevated liver panel  . Sulfamethoxazole-Trimethoprim     REACTION: itching   Objective:   There were no vitals filed for this visit. There is no height or weight on file to calculate BMI. Constitutional Well developed. Well nourished.  Vascular Dorsalis pedis pulses palpable bilaterally. Posterior tibial pulses palpable bilaterally. Capillary refill normal to all digits.  No cyanosis or clubbing noted. Pedal hair growth normal.  Neurologic Normal speech. Oriented to person, place,  and time. Epicritic sensation to light touch grossly present bilaterally.  Dermatologic Nails well groomed and normal in appearance. No open wounds.  Orthopedic: Normal joint ROM without pain or crepitus bilaterally. No visible deformities. Palpation about the fifth MPJ right. No POP left.   Radiographs: Taken and reviewed no acute fracture dislocations Assessment:   No diagnosis found. Plan:  Patient was evaluated and treated and all questions answered.  Capsulitis right fifth MPJ -Injection delivered as below  Procedure: Joint Injection Location: Right 5th MPJ joint Skin Prep: Alcohol. Injectate: 0.5 cc 1% lidocaine plain, 0.5 cc dexamethasone phosphate. Disposition: Patient  tolerated procedure well. Injection site dressed with a band-aid.   Left fifth MPJ capsulitis, porokeratosis -Improved no pain today  Onychomycosis with coagulation defect -Nails debrided as below  Procedure: Nail Debridement Rationale: Patient meets criteria for routine foot care due to coagulation Defect Type of Debridement: manual, sharp debridement. Instrumentation: Nail nipper, rotary burr. Number of Nails: 10     Return in about 9 weeks (around 08/06/2018) for RFC - on Plavix .

## 2018-06-06 ENCOUNTER — Encounter (HOSPITAL_COMMUNITY): Payer: Medicare Other

## 2018-06-10 ENCOUNTER — Ambulatory Visit (HOSPITAL_COMMUNITY)
Admission: RE | Admit: 2018-06-10 | Discharge: 2018-06-10 | Disposition: A | Discharge status: Home / Self Care | Payer: Medicare Other | Source: Ambulatory Visit | Attending: Neurology | Admitting: Neurology

## 2018-06-10 DIAGNOSIS — I6990 Unspecified sequelae of unspecified cerebrovascular disease: Secondary | ICD-10-CM | POA: Diagnosis not present

## 2018-06-10 NOTE — Progress Notes (Signed)
Bilateral carotid duplex completed. Preliminary results found in chart review CV Proc. [place] Dellar Traber,RVS  06/10/2018 3:35 PM

## 2018-06-20 ENCOUNTER — Ambulatory Visit (INDEPENDENT_AMBULATORY_CARE_PROVIDER_SITE_OTHER): Payer: Medicare Other

## 2018-06-20 DIAGNOSIS — I639 Cerebral infarction, unspecified: Secondary | ICD-10-CM | POA: Diagnosis not present

## 2018-06-21 LAB — CUP PACEART REMOTE DEVICE CHECK
Date Time Interrogation Session: 20200206124120
Implantable Pulse Generator Implant Date: 20161018

## 2018-07-01 NOTE — Progress Notes (Signed)
Carelink Summary Report / Loop Recorder 

## 2018-07-23 ENCOUNTER — Ambulatory Visit (INDEPENDENT_AMBULATORY_CARE_PROVIDER_SITE_OTHER): Payer: Medicare Other | Admitting: *Deleted

## 2018-07-23 DIAGNOSIS — I639 Cerebral infarction, unspecified: Secondary | ICD-10-CM | POA: Diagnosis not present

## 2018-07-24 LAB — CUP PACEART REMOTE DEVICE CHECK
Date Time Interrogation Session: 20200310124248
Implantable Pulse Generator Implant Date: 20161018

## 2018-07-30 NOTE — Progress Notes (Signed)
Carelink Summary Report / Loop Recorder 

## 2018-08-05 ENCOUNTER — Telehealth: Payer: Self-pay | Admitting: Cardiology

## 2018-08-05 NOTE — Telephone Encounter (Signed)
LMOVM for pt to return call. ILR reached RRT and need to inform pt.

## 2018-08-05 NOTE — Telephone Encounter (Signed)
I explained to the pt that her battery has reached RRT. I advised her that her options was for one to unplug the monitor and I could send her a return kit for that monitor and she could leave the ILR in. I also advised her that she could have the ILR removed and talk with her Dr. About having the loop removed. The patient states if she is paying for the monitor why would she send it in? She stated she would rather talk with the nurse, or Pamala Hurry.

## 2018-08-06 ENCOUNTER — Ambulatory Visit: Payer: Medicare Other | Admitting: Podiatry

## 2018-08-06 NOTE — Telephone Encounter (Signed)
LMOVM for pt to return call 

## 2018-08-07 NOTE — Telephone Encounter (Signed)
Discussed w/ patient.   Patient aware that we are not currently scheduling non-emergent procedure due to current pandemic. Patient will call the office if she decides to have taken out after current pandemic (COVID-19).

## 2018-08-07 NOTE — Telephone Encounter (Signed)
Spoke w/ pt and her husband. She agreed to send the home monitor back. Return kit ordered. They would like MD or Nurse to call back to discuss the risk and benefits of leaving the ILR in verus taking it out.

## 2018-08-26 ENCOUNTER — Other Ambulatory Visit: Payer: Self-pay

## 2018-08-26 ENCOUNTER — Encounter: Payer: 59 | Admitting: *Deleted

## 2018-09-02 ENCOUNTER — Encounter: Payer: Medicare Other | Admitting: Family Medicine

## 2018-09-10 ENCOUNTER — Telehealth: Payer: Self-pay | Admitting: Family Medicine

## 2018-09-10 DIAGNOSIS — F411 Generalized anxiety disorder: Secondary | ICD-10-CM

## 2018-09-10 NOTE — Telephone Encounter (Signed)
Last OV 03/01/18 Last refill 03/01/18 #90/1 Next OV CPE 11/05/18  Called pt to schedule med f/u. Left VM to call the office.

## 2018-09-11 NOTE — Telephone Encounter (Signed)
Patient called in stating she does not want to make an appointment for the medication refill and will wait till her physical to ask for refills.

## 2018-09-12 NOTE — Telephone Encounter (Signed)
Rx was sent to pharmacy on 09/11/2018.

## 2018-09-12 NOTE — Telephone Encounter (Signed)
That's fine- can give enough rx to get to physical if needed

## 2018-09-12 NOTE — Telephone Encounter (Signed)
See note

## 2018-09-16 ENCOUNTER — Ambulatory Visit (INDEPENDENT_AMBULATORY_CARE_PROVIDER_SITE_OTHER): Payer: Medicare Other | Admitting: Podiatry

## 2018-09-16 DIAGNOSIS — M7752 Other enthesopathy of left foot: Secondary | ICD-10-CM

## 2018-09-16 DIAGNOSIS — M7750 Other enthesopathy of unspecified foot: Secondary | ICD-10-CM

## 2018-09-17 ENCOUNTER — Telehealth: Payer: Self-pay | Admitting: Podiatry

## 2018-09-17 ENCOUNTER — Telehealth: Payer: Self-pay | Admitting: Cardiology

## 2018-09-17 NOTE — Telephone Encounter (Signed)
New Message    1. Has your device fired? no  2. Is you device beeping? no  3. Are you experiencing draining or swelling at device site? no  4. Are you calling to see if we received your device transmission? no  5. Have you passed out? No   Patient is calling because she received a letter that her device check did not go through.. She states that has been discontinued and that she was suppose to receive a return kit for the transmission unit. Please call.     Please route to [place]

## 2018-09-17 NOTE — Telephone Encounter (Signed)
Left voicemail for patient to schedule 3 week wart f/u with Dr. March Rummage in [place]

## 2018-09-17 NOTE — Telephone Encounter (Signed)
Spoke w/ pt and informed her to disregard letter. Informed her I would call [employer] tech support and have a new return kit ordered. Pt verbalized understanding.

## 2018-09-18 NOTE — Telephone Encounter (Signed)
Return kit for monitor ordered.

## 2018-10-04 ENCOUNTER — Other Ambulatory Visit: Payer: Self-pay | Admitting: Obstetrics and Gynecology

## 2018-10-04 DIAGNOSIS — Z1231 Encounter for screening mammogram for malignant neoplasm of breast: Secondary | ICD-10-CM

## 2018-10-08 ENCOUNTER — Ambulatory Visit (INDEPENDENT_AMBULATORY_CARE_PROVIDER_SITE_OTHER): Payer: Medicare Other | Admitting: Podiatry

## 2018-10-08 ENCOUNTER — Other Ambulatory Visit: Payer: Self-pay

## 2018-10-08 ENCOUNTER — Encounter: Payer: Self-pay | Admitting: Podiatry

## 2018-10-08 VITALS — Temp 97.1°F | Resp 16

## 2018-10-08 DIAGNOSIS — M7750 Other enthesopathy of unspecified foot: Secondary | ICD-10-CM | POA: Diagnosis not present

## 2018-10-08 NOTE — Progress Notes (Signed)
Subjective:  Patient ID: Brianna Simon, female    DOB: 1950-12-06,  MRN: [id number]  No chief complaint on file.   68 y.o. female presents with the above complaint.  Patient states the pain is back on the left side with a little lesion that she believes is a wart.  The  Requests nail care today.  Review of Systems: Negative except as noted in the HPI. Denies N/V/F/Ch.  Past Medical History:  Diagnosis Date  . Anxiety   . Arthritis    hips, hands, lower back - otc med prn  . Cancer ([place])    skin cancer left wrist and left chest  . GERD (gastroesophageal reflux disease)   . Hx: UTI (urinary tract infection)   . Hyperlipidemia   . Hypertension   . Mitral valve prolapse    does not cause patient any problems  . Seasonal allergies   . Stroke [facility]) 2011   mild  . SVD (spontaneous vaginal delivery)    x 1 - gave up for adoption    Current Outpatient Medications:  .  atorvastatin (LIPITOR) 40 MG tablet, Take 1 tablet (40 mg total) by mouth daily at 6 PM., Disp: 90 tablet, Rfl: 3 .  azelastine (ASTELIN) 0.1 % nasal spray, INSTILL 1 SPRAY IN EACH NOSTRIL TWICE A DAY, Disp: , Rfl: 5 .  cholecalciferol (VITAMIN D) 1000 units tablet, Take 1,000 Units by mouth daily., Disp: , Rfl:  .  clopidogrel (PLAVIX) 75 MG tablet, TAKE 1 TABLET BY MOUTH DAILY, Disp: 90 tablet, Rfl: 3 .  fexofenadine (ALLEGRA) 180 MG tablet, Take 180 mg by mouth daily., Disp: , Rfl:  .  fluticasone (FLONASE) 50 MCG/ACT nasal spray, INSTILL 1 SPRAY INTO EACH NOSTRIL EVERY DAY, Disp: , Rfl: 5 .  hydrochlorothiazide (HYDRODIURIL) 25 MG tablet, TAKE 1/2 TABLET BY MOUTH EVERY DAY, Disp: 45 tablet, Rfl: 1 .  ipratropium (ATROVENT) 0.06 % nasal spray, INSTILL 2 SPRAYS IN EACH NOSTRIL 3 TIMES DAILY AS NEEDED, Disp: , Rfl: 5 .  lansoprazole (PREVACID) 15 MG capsule, Take 15 mg by mouth daily at 12 noon., Disp: , Rfl:  .  montelukast (SINGULAIR) 10 MG tablet, Take 1 tablet by mouth at bedtime., Disp: , Rfl: 5 .   sertraline (ZOLOFT) 100 MG tablet, TAKE 1 TABLET BY MOUTH EVERY DAY, Disp: 90 tablet, Rfl: 1  Social History   Tobacco Use  Smoking Status Former Smoker  . Packs/day: 2.00  . Years: 30.00  . Pack years: 60.00  . Types: Cigarettes  . Last attempt to quit: 03/17/2003  . Years since quitting: 15.5  Smokeless Tobacco Never Used    Allergies  Allergen Reactions  . Citalopram Hydrobromide     REACTION: blurred vision  . Codeine Phosphate     Liquid formulation only bothers her; tolerates percocet fine.  REACTION: nausea,vomiting  . Nitrofurantoin     REACTION: itching  . Nsaids Other (See Comments)    Elevated liver panel  . Sulfamethoxazole-Trimethoprim     REACTION: itching   Objective:   There were no vitals filed for this visit. There is no height or weight on file to calculate BMI. Constitutional Well developed. Well nourished.  Vascular Dorsalis pedis pulses palpable bilaterally. Posterior tibial pulses palpable bilaterally. Capillary refill normal to all digits.  No cyanosis or clubbing noted. Pedal hair growth normal.  Neurologic Normal speech. Oriented to person, place, and time. Epicritic sensation to light touch grossly present bilaterally.  Dermatologic Nails well groomed and normal in  appearance. No open wounds. Punctate hyperkeratosis left fifth MPJ  Orthopedic: Normal joint ROM without pain or crepitus bilaterally. No visible deformities. Pain to palpation of the left fifth MPJ   Radiographs: Taken and reviewed no acute fracture dislocations Assessment:   1. Capsulitis of metatarsophalangeal (MTP) joint    Plan:  Patient was evaluated and treated and all questions answered.  Capsulitis left fifth MPJ -Injection delivered to the left fifth MPJ  Procedure: Joint Injection Location: Left 5th MPJ joint Skin Prep: Alcohol. Injectate: 0.5 cc 1% lidocaine plain, 0.5 cc dexamethasone phosphate. Disposition: Patient tolerated procedure well. Injection  site dressed with a band-aid.    No follow-ups on file.

## 2018-10-08 NOTE — Progress Notes (Signed)
Subjective:  Patient ID: Brianna Simon, female    DOB: 01/24/1951,  MRN: [id number]  Chief Complaint  Patient presents with  . capsulitis    F/U Lt capsulitis Pt. states," pian is a lot better, don't even feel it anymore." Tx: none    68 y.o. female presents with the above complaint. Hx above confirmed with patient.  Denies pain to either foot.  Review of Systems: Negative except as noted in the HPI. Denies N/V/F/Ch.  Past Medical History:  Diagnosis Date  . Anxiety   . Arthritis    hips, hands, lower back - otc med prn  . Cancer ([place])    skin cancer left wrist and left chest  . GERD (gastroesophageal reflux disease)   . Hx: UTI (urinary tract infection)   . Hyperlipidemia   . Hypertension   . Mitral valve prolapse    does not cause patient any problems  . Seasonal allergies   . Stroke [facility]) 2011   mild  . SVD (spontaneous vaginal delivery)    x 1 - gave up for adoption    Current Outpatient Medications:  .  atorvastatin (LIPITOR) 40 MG tablet, Take 1 tablet (40 mg total) by mouth daily at 6 PM., Disp: 90 tablet, Rfl: 3 .  azelastine (ASTELIN) 0.1 % nasal spray, INSTILL 1 SPRAY IN EACH NOSTRIL TWICE A DAY, Disp: , Rfl: 5 .  cholecalciferol (VITAMIN D) 1000 units tablet, Take 1,000 Units by mouth daily., Disp: , Rfl:  .  clopidogrel (PLAVIX) 75 MG tablet, TAKE 1 TABLET BY MOUTH DAILY, Disp: 90 tablet, Rfl: 3 .  fexofenadine (ALLEGRA) 180 MG tablet, Take 180 mg by mouth daily., Disp: , Rfl:  .  fluticasone (FLONASE) 50 MCG/ACT nasal spray, INSTILL 1 SPRAY INTO EACH NOSTRIL EVERY DAY, Disp: , Rfl: 5 .  hydrochlorothiazide (HYDRODIURIL) 25 MG tablet, TAKE 1/2 TABLET BY MOUTH EVERY DAY, Disp: 45 tablet, Rfl: 1 .  ipratropium (ATROVENT) 0.06 % nasal spray, INSTILL 2 SPRAYS IN EACH NOSTRIL 3 TIMES DAILY AS NEEDED, Disp: , Rfl: 5 .  lansoprazole (PREVACID) 15 MG capsule, Take 15 mg by mouth daily at 12 noon., Disp: , Rfl:  .  montelukast (SINGULAIR) 10 MG tablet, Take 1 tablet  by mouth at bedtime., Disp: , Rfl: 5 .  sertraline (ZOLOFT) 100 MG tablet, TAKE 1 TABLET BY MOUTH EVERY DAY, Disp: 90 tablet, Rfl: 1  Social History   Tobacco Use  Smoking Status Former Smoker  . Packs/day: 2.00  . Years: 30.00  . Pack years: 60.00  . Types: Cigarettes  . Last attempt to quit: 03/17/2003  . Years since quitting: 15.5  Smokeless Tobacco Never Used    Allergies  Allergen Reactions  . Citalopram Hydrobromide     REACTION: blurred vision  . Codeine Phosphate     Liquid formulation only bothers her; tolerates percocet fine.  REACTION: nausea,vomiting  . Nitrofurantoin     REACTION: itching  . Nsaids Other (See Comments)    Elevated liver panel  . Sulfamethoxazole-Trimethoprim     REACTION: itching   Objective:   Vitals:   10/08/18 1617  Resp: 16  Temp: (!) 97.1 F (36.2 C)   There is no height or weight on file to calculate BMI. Constitutional Well developed. Well nourished.  Vascular Dorsalis pedis pulses palpable bilaterally. Posterior tibial pulses palpable bilaterally. Capillary refill normal to all digits.  No cyanosis or clubbing noted. Pedal hair growth normal.  Neurologic Normal speech. Oriented to person, place, and  time. Epicritic sensation to light touch grossly present bilaterally.  Dermatologic Nails well groomed and normal in appearance. No open wounds. Slight hyperkeratosis to the left fifth metatarsal phalangeal joint  Orthopedic:  No pain palpation about either metatarsal phalangeal joint.   Radiographs: None Assessment:   1. Capsulitis of metatarsophalangeal (MTP) joint    Plan:  Patient was evaluated and treated and all questions answered.  Capsulitis left fifth metatarsal phalangeal joint with porokeratosis -Resolved.  Discussed the patient that should the pain recur would consider possible injection versus excision of the fifth metatarsal head if the pain persists.  We will have patient follow-up as needed should pain  recur  No follow-ups on file.

## 2018-10-19 ENCOUNTER — Other Ambulatory Visit: Payer: Self-pay | Admitting: Neurology

## 2018-11-05 ENCOUNTER — Encounter: Payer: Self-pay | Admitting: Family Medicine

## 2018-11-05 ENCOUNTER — Ambulatory Visit (INDEPENDENT_AMBULATORY_CARE_PROVIDER_SITE_OTHER): Payer: Medicare Other | Admitting: Family Medicine

## 2018-11-05 ENCOUNTER — Other Ambulatory Visit: Payer: Self-pay

## 2018-11-05 VITALS — BP 102/66 | HR 66 | Temp 98.3°F | Ht 61.0 in | Wt 143.0 lb

## 2018-11-05 DIAGNOSIS — I69359 Hemiplegia and hemiparesis following cerebral infarction affecting unspecified side: Secondary | ICD-10-CM | POA: Diagnosis not present

## 2018-11-05 DIAGNOSIS — E663 Overweight: Secondary | ICD-10-CM | POA: Diagnosis not present

## 2018-11-05 DIAGNOSIS — D692 Other nonthrombocytopenic purpura: Secondary | ICD-10-CM | POA: Diagnosis not present

## 2018-11-05 DIAGNOSIS — Z0000 Encounter for general adult medical examination without abnormal findings: Secondary | ICD-10-CM

## 2018-11-05 DIAGNOSIS — F321 Major depressive disorder, single episode, moderate: Secondary | ICD-10-CM | POA: Diagnosis not present

## 2018-11-05 DIAGNOSIS — E785 Hyperlipidemia, unspecified: Secondary | ICD-10-CM

## 2018-11-05 DIAGNOSIS — I10 Essential (primary) hypertension: Secondary | ICD-10-CM

## 2018-11-05 DIAGNOSIS — Z23 Encounter for immunization: Secondary | ICD-10-CM | POA: Diagnosis not present

## 2018-11-05 MED ORDER — ESCITALOPRAM OXALATE 10 MG PO TABS: 10.0000 mg | ORAL_TABLET | Freq: Every day | ORAL | 5 refills | Status: DC

## 2018-11-05 MED ORDER — FAMOTIDINE 20 MG PO TABS: 20.0000 mg | ORAL_TABLET | Freq: Two times a day (BID) | ORAL | 3 refills | Status: DC

## 2018-11-05 NOTE — Addendum Note (Signed)
Addended by: Tomi Likens on: 11/05/2018 03:51 PM   Modules accepted: Orders

## 2018-11-05 NOTE — Addendum Note (Signed)
Addended by: Marin Olp on: 11/05/2018 03:48 PM   Modules accepted: Orders

## 2018-11-05 NOTE — Patient Instructions (Addendum)
Health Maintenance Due  Topic Date Due  .  Recommend Pneumovax 23 today 11/16/2017   See [place] handout - I think [facility] would be a good fit for you.   For reflux- Will trial pepcid twice a day- sent this in   Please stop by lab before you go If you do not have mychart- we will call you about results within 5 business days of [place] receiving them.  If you have mychart- we will send your results within 3 business days of [place] receiving them.  If abnormal or we want to clarify a result, we will call or mychart you to make sure you receive the message.  If you have questions or concerns or don't hear within 5-7 days, please send [place] a message or call us.      Dupuytren's Contracture Dupuytren's contracture is a condition in which tissue under the skin of the palm becomes thick. This causes one or more of the fingers to curl inward (contract) toward the palm. After a while, the fingers may not be able to straighten out. This condition affects some or all of the fingers and the palm of the hand. This condition may affect one or both hands. Dupuytren's contracture is a long-term (chronic) condition that develops (progresses) slowly over time. There is no cure, but symptoms can be managed and progression can be slowed with treatment. This condition is usually not dangerous or painful, but it can interfere with everyday tasks. What are the causes?  This condition is caused by tissue (fascia) in the palm that gets thicker and tighter. When the fascia thickens, it pulls on the cords of tissue (tendons) that control finger movement. This causes the fingers to contract. The cause of fascia thickening is not known. However, the condition is often passed along from parent to child (inherited). What increases the risk? The following factors may make you more likely to develop this condition:  Being 75 years of age or older.  Being female.  Having a family history of this  condition.  Using tobacco products, including cigarettes, chewing tobacco, and e-cigarettes.  Drinking alcohol excessively.  Having diabetes.  Having a seizure disorder. What are the signs or symptoms? Early symptoms of this condition may include:  Thick, puckered skin on the hand.  One or more lumps (nodules) on the palm. Nodules may be tender when they first appear, but they are generally painless. Later symptoms of this condition may include:  Thick cords of tissue in the palm.  Fingers curled up toward the palm.  Inability to straighten the fingers into their normal position. Though this condition is usually painless, you may have discomfort when holding or grabbing objects. How is this diagnosed? This condition is diagnosed with a physical exam, which may include:  Looking at your hands and feeling your palms. This is to check for thickened fascia and nodules.  Measuring finger motion.  Doing the [place] tabletop test. You may be asked to try to put your hand on a surface, with your palm down and your fingers straight out. How is this treated? There is no cure for this condition, but treatment can relieve discomfort and make symptoms more manageable. Treatment options may include:  Physical therapy. This can strengthen your hand and increase flexibility.  Occupational therapy. This can help you with everyday tasks that may be more difficult because of your condition.  Shots (injections). Substances may be injected into your hand, such as: ? Medicines that help  to decrease swelling (corticosteroids). ? Proteins (collagenase) to weaken thick tissue. After a collagenase injection, your health care provider may stretch your fingers.  Needle aponeurotomy. A needle is pushed through the skin and into the fascia. Moving the needle against the fascia can weaken or break up the thick tissue.  Surgery. This may be needed if your condition causes discomfort or interferes with  everyday activities. Physical therapy is usually needed after surgery. No treatment is guaranteed to cure this condition. Recurrence of symptoms is common. Follow these instructions at home: Hand care  Take these actions to help protect your hand from possible injury: ? Use tools that have padded grips. ? Wear protective gloves while you work with your hands. ? Avoid repetitive hand movements. General instructions  Take over-the-counter and prescription medicines only as told by your health care provider.  Manage any other conditions that you have, such as diabetes.  If physical therapy was prescribed, do exercises as told by your health care provider.  Do not use any products that contain nicotine or tobacco, such as cigarettes, e-cigarettes, and chewing tobacco. If you need help quitting, ask your health care provider.  If you drink alcohol: ? Limit how much you use to:  0-1 drink a day for women.  0-2 drinks a day for men. ? Be aware of how much alcohol is in your drink. In the [place] one drink equals one 12 oz bottle of beer (355 mL), one 5 oz glass of wine (148 mL), or one 1 oz glass of hard liquor (44 mL).  Keep all follow-up visits as told by your health care provider. This is important. Contact a health care provider if:  You develop new symptoms, or your symptoms get worse.  You have pain that gets worse or does not get better with medicine.  You have difficulty or discomfort with everyday tasks.  You develop numbness or tingling. Get help right away if:  You have severe pain.  Your fingers change color or become unusually cold. Summary  Dupuytren's contracture is a condition in which tissue under the skin of the palm becomes thick.  This condition is caused by tissue (fascia) that thickens. When it thickens, it pulls on the cords of tissue (tendons) that control finger movement and makes the fingers to contract.  You are more likely to develop this condition  if you are a man, are over 64 years of age, have a family history of the condition, and drink a lot of alcohol.  This condition can be treated with physical and occupational therapy, injections, and surgery.  Follow instructions about how to care for your hand. Get help right away if you have severe pain or your fingers change color or become cold. This information is not intended to replace advice given to you by your health care provider. Make sure you discuss any questions you have with your health care provider. Document Released: 02/26/2009 Document Revised: 11/20/2017 Document Reviewed: 11/20/2017 Elsevier Interactive Patient Education  2019 [employer].

## 2018-11-05 NOTE — Addendum Note (Signed)
Addended by: Kevan Ny on: 11/05/2018 05:08 PM   Modules accepted: Orders

## 2018-11-05 NOTE — Progress Notes (Addendum)
Phone: [phone]   Subjective:  Patient presents today for their annual physical. Chief complaint-noted.   See problem oriented charting- ROS- full  review of systems was completed and negative except for: Postnasal drip, tinnitus, seasonal allergies, back pain, numbness, speech difficulty, easy bruising, difficulty concentrating, anxiety  The following were reviewed and entered/updated in epic: Past Medical History:  Diagnosis Date   Anxiety    Arthritis    hips, hands, lower back - otc med prn   Cancer ([place])    skin cancer left wrist and left chest   GERD (gastroesophageal reflux disease)    Hx: UTI (urinary tract infection)    Hyperlipidemia    Hypertension    Mitral valve prolapse    does not cause patient any problems   Seasonal allergies    Stroke ([place]) 2011   mild   SVD (spontaneous vaginal delivery)    x 1 - gave up for adoption   Patient Active Problem List   Diagnosis Date Noted   Cryptogenic stroke ([place]) 03/01/2015    Priority: High   History of CVA (cerebrovascular accident) 02/22/2010    Priority: High   Osteopenia 10/02/2017    Priority: Medium   Former smoker 12/09/2014    Priority: Medium   Anxiety state 10/27/2014    Priority: Medium   Hyperlipidemia 02/22/2010    Priority: Medium   Essential hypertension 12/07/2006    Priority: Medium   Allergic rhinitis 10/27/2014    Priority: Low   Esophageal reflux 12/31/2006    Priority: Low   Insomnia 12/31/2006    Priority: Low   MVP (mitral valve prolapse) 12/10/2006    Priority: Low   Senile purpura ([place]) 11/05/2018   Hemiplegia affecting dominant side, 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[place]) 03/01/2018   Benign paroxysmal positional vertigo 11/13/2016   Low back pain 09/26/2016   Dysarthria 03/02/2015   Past Surgical History:  Procedure Laterality Date   COLONOSCOPY     DILATATION & CURETTAGE/HYSTEROSCOPY WITH TRUECLEAR N/A 10/24/2013   Procedure: DILATATION & CURETTAGE/HYSTEROSCOPY  WITH TRUCLEAR;  Surgeon: Logan Bores, MD;  Location: [facility];  Service: Gynecology;  Laterality: N/A;  1 1/2hrs OR time   EP IMPLANTABLE DEVICE N/A 03/02/2015   Procedure: Loop Recorder Insertion;  Surgeon: Will Meredith Leeds, MD;  Location: [facility];  Service: Cardiovascular;  Laterality: N/A;   lipoma removed     right shoulder   SINUS SURGERY WITH INSTATRAK     WISDOM TOOTH EXTRACTION      Family History  Problem Relation Age of Onset   Arrhythmia Mother 54       pacemaker    Stroke Mother    Stroke Paternal Grandmother    Diverticulitis Other    Breast cancer Maternal Aunt    Breast cancer Cousin     Medications- reviewed and updated Current Outpatient Medications  Medication Sig Dispense Refill   atorvastatin (LIPITOR) 40 MG tablet TAKE 1 TABLET (40 MG TOTAL) BY MOUTH DAILY AT 6 PM. 90 tablet 3   azelastine (ASTELIN) 0.1 % nasal spray INSTILL 1 SPRAY IN EACH NOSTRIL TWICE A DAY  5   cholecalciferol (VITAMIN D) 1000 units tablet Take 1,000 Units by mouth daily.     clopidogrel (PLAVIX) 75 MG tablet TAKE 1 TABLET BY MOUTH DAILY 90 tablet 3   fexofenadine (ALLEGRA) 180 MG tablet Take 180 mg by mouth daily.     fluticasone (FLONASE) 50 MCG/ACT nasal spray INSTILL 1 SPRAY INTO EACH NOSTRIL EVERY DAY  5  hydrochlorothiazide (HYDRODIURIL) 25 MG tablet TAKE 1/2 TABLET BY MOUTH EVERY DAY 45 tablet 1   ipratropium (ATROVENT) 0.06 % nasal spray INSTILL 2 SPRAYS IN EACH NOSTRIL 3 TIMES DAILY AS NEEDED  5   montelukast (SINGULAIR) 10 MG tablet Take 1 tablet by mouth at bedtime.  5   Multiple Vitamin (MULTIVITAMIN) tablet Take 1 tablet by mouth daily.     Multiple Vitamins-Minerals (HAIR SKIN AND NAILS FORMULA PO) Take by mouth.     sertraline (ZOLOFT) 100 MG tablet TAKE 1 TABLET BY MOUTH EVERY DAY 90 tablet 1   escitalopram (LEXAPRO) 10 MG tablet Take 1 tablet (10 mg total) by mouth daily. 30 tablet 5   famotidine (PEPCID) 20 MG tablet Take 1  tablet (20 mg total) by mouth 2 (two) times daily. 180 tablet 3   No current facility-administered medications for this visit.     Allergies-reviewed and updated Allergies  Allergen Reactions   Citalopram Hydrobromide     REACTION: blurred vision   Codeine Phosphate     Liquid formulation only bothers her; tolerates percocet fine.  REACTION: nausea,vomiting   Nitrofurantoin     REACTION: itching   Nsaids Other (See Comments)    Elevated liver panel   Sulfamethoxazole-Trimethoprim     REACTION: itching    Social History   Social History Narrative   Married. No children. Husband patient of Dr. 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       Hobbies: dancing, time out with husband, enjoys concerts   Objective  Objective:  BP 102/66 (BP Location: Left Arm, Patient Position: Sitting, Cuff Size: Normal)    Pulse 66    Temp 98.3 F (36.8 C) (Oral)    Ht 5\' 1"  (1.549 m)    Wt 143 lb (64.9 kg)    LMP  (LMP Unknown)    SpO2 94%    BMI 27.02 kg/m  Gen: NAD, resting comfortably HEENT: Tm normal, external ear normal Neck: no thyromegaly or cervical lymphadenopathy CV: RRR no murmurs rubs or gallops Lungs: CTAB no crackles, wheeze, rhonchi Abdomen: soft/nontender/nondistended/normal bowel sounds. No rebound or guarding.  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 female presenting for annual physical.  Health Maintenance counseling: 1. Anticipatory guidance: Patient counseled regarding regular dental exams -q6 months, eye exams -,  avoiding smoking and second hand smoke , limiting alcohol to 1 beverage per day .   2. Risk factor reduction:  Advised patient of need for regular exercise and diet rich and fruits and vegetables to reduce risk of heart attack and stroke. Exercise- on and off- advised 150 mins exercise per week. Diet- reasonably  healthy diet.  Wt Readings from Last 3 Encounters:  11/05/18 143 lb (64.9 kg)  05/29/18 143 lb 6.4 oz (65 kg)  03/01/18 143 lb 9.6 oz (65.1 kg)  3. Immunizations/screenings/ancillary studies- Pneumovax 23 recommended today-with changing guidelines on Prevnar 13  Immunization History  Administered Date(s) Administered   Influenza Split 03/17/2011, 02/09/2012   Influenza Whole 02/12/2008, 02/22/2010   Influenza, High Dose Seasonal PF 02/27/2018   Influenza,inj,Quad PF,6+ Mos 02/10/2013, 03/09/2014, 02/05/2015, 02/25/2016, 02/23/2017   Tdap 10/27/2014   Zoster 04/03/2013   Zoster Recombinat (Shingrix) 09/26/2016, 12/26/2016  4. Cervical cancer screening- past age based screening for Pap smear- has had these with gynecology- still gts pelvic exams and now spaced  out 5. Breast cancer screening-  breast exam with GYN and mammogram 09/2017 and scheduled for July this year 6. Colon cancer screening - cologuard last year 03/2018 good for 3 years 7. Skin cancer screening-follows with dermatology yearly. advised regular sunscreen use. Denies worrisome, changing, or new skin lesions.  8. Birth control/STD check- postmenopausal and monogomous 9. Osteoporosis screening at 16- ordered by Dr. Marvel Plan last year- has osteopenia and is on vitamin D. Discussed weight bearing exercise.  42.  Former smoker- quit smoking 2004 so out of range for lung cancer screening (15 years)  Status of chronic or acute concerns   History of CVA with dominant side hemiplegia- patient compliant with Plavix.  Hemiplegia stable and right hand with right hand numbness and right cheek numbness.  States harder to pick things up. Also notes some slurred speech if gets into faster pace speech  Reflux- compliant with Prevacid-poorly controlled. Did ok on zantac. Will trial pepcid twice a day- sent this in   Hyperlipidemia- on atorvastatin 40 mg-update lipids   Hypertension-controlled on hydrochlorothiazide 12.5 mg    Anxiety/depressed mood-patient compliant with Zoloft 100 mg. A lot of frustration with husband being in the same house for so long- needs a break.  Elevated PHQ 9 today noted today - depression may be situational -Discussed potential mood issues with montelukast- advised her to discuss with her allergiest -she is open to adding counseling and prefers this over medication -After visit patient changed her mind and does not want to pursue counseling-wants to try Lexapro 10 mg-I was hesitant about increasing Zoloft with QT corrected around 450 on last check. -Blurred vision on enantiomer celexa- if that occcurs should go immediately back to zoloft   Mild middle finger trigger finger- discussed splinting  Seasonal allergies- sees allergist   Intermittent issues with back pain  Easy bruising/bleeding- discussed senile purpura- stable likely related to plavix  6 month follow up Future Appointments  Date Time Provider [place]  11/22/2018  3:40 PM [facility] MM 2 [facility]   Lab/Order associations: not fasting    ICD-10-CM   1. Preventative health care  Z00.00 CBC    Comprehensive metabolic panel    LDL cholesterol, direct  2. Hyperlipidemia, unspecified hyperlipidemia type  E78.5 CBC    Comprehensive metabolic panel    LDL cholesterol, direct  3. Essential hypertension  I10   4. Hemiplegia affecting dominant side, 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[facility])  I69.359   5. Senile purpura ([facility])  D69.2   6. Overweight  E66.3   7. Depression, major, single episode, moderate ([facility])  F32.1     Meds ordered this encounter  Medications   famotidine (PEPCID) 20 MG tablet    Sig: Take 1 tablet (20 mg total) by mouth 2 (two) times daily.    Dispense:  180 tablet    Refill:  3   escitalopram (LEXAPRO) 10 MG tablet    Sig: Take 1 tablet (10 mg total) by mouth daily.    Dispense:  30 tablet    Refill:  5    Return precautions advised.  Garret Reddish, MD

## 2018-11-06 LAB — CBC
HCT: 42.4 % (ref 36.0–46.0)
Hemoglobin: 14.1 g/dL (ref 12.0–15.0)
MCHC: 33.2 g/dL (ref 30.0–36.0)
MCV: 91.9 fl (ref 78.0–100.0)
Platelets: 288 10*3/uL (ref 150.0–400.0)
RBC: 4.62 Mil/uL (ref 3.87–5.11)
RDW: 13.6 % (ref 11.5–15.5)
WBC: 5.2 10*3/uL (ref 4.0–10.5)

## 2018-11-06 LAB — COMPREHENSIVE METABOLIC PANEL
ALT: 21 U/L (ref 0–35)
AST: 18 U/L (ref 0–37)
Albumin: 4.1 g/dL (ref 3.5–5.2)
Alkaline Phosphatase: 128 U/L — ABNORMAL HIGH (ref 39–117)
BUN: 14 mg/dL (ref 6–23)
CO2: 30 mEq/L (ref 19–32)
Calcium: 9 mg/dL (ref 8.4–10.5)
Chloride: 103 mEq/L (ref 96–112)
Creatinine, Ser: 0.61 mg/dL (ref 0.40–1.20)
GFR: 98.14 mL/min (ref >60.00)
Glucose, Bld: 90 mg/dL (ref 70–99)
Potassium: 3.5 mEq/L (ref 3.5–5.1)
Sodium: 139 mEq/L (ref 135–145)
Total Bilirubin: 0.4 mg/dL (ref 0.2–1.2)
Total Protein: 6.4 g/dL (ref 6.0–8.3)

## 2018-11-06 LAB — LDL CHOLESTEROL, DIRECT: Direct LDL: 64 mg/dL

## 2018-11-22 ENCOUNTER — Other Ambulatory Visit: Payer: Self-pay

## 2018-11-22 ENCOUNTER — Ambulatory Visit
Admission: RE | Admit: 2018-11-22 | Discharge: 2018-11-22 | Disposition: A | Discharge status: Home / Self Care | Payer: 59 | Source: Ambulatory Visit | Attending: Obstetrics and Gynecology | Admitting: Obstetrics and Gynecology

## 2018-11-22 DIAGNOSIS — Z1231 Encounter for screening mammogram for malignant neoplasm of breast: Secondary | ICD-10-CM

## 2018-11-27 ENCOUNTER — Other Ambulatory Visit: Payer: Self-pay | Admitting: Family Medicine

## 2018-11-29 ENCOUNTER — Other Ambulatory Visit: Payer: Self-pay | Admitting: Family Medicine

## 2018-12-12 DIAGNOSIS — G4721 Circadian rhythm sleep disorder, delayed sleep phase type: Secondary | ICD-10-CM | POA: Diagnosis not present

## 2018-12-22 ENCOUNTER — Other Ambulatory Visit: Payer: Self-pay | Admitting: Neurology

## 2018-12-22 DIAGNOSIS — I639 Cerebral infarction, unspecified: Secondary | ICD-10-CM

## 2018-12-23 DIAGNOSIS — Z01419 Encounter for gynecological examination (general) (routine) without abnormal findings: Secondary | ICD-10-CM | POA: Diagnosis not present

## 2018-12-23 DIAGNOSIS — Z124 Encounter for screening for malignant neoplasm of cervix: Secondary | ICD-10-CM | POA: Diagnosis not present

## 2018-12-23 LAB — HM PAP SMEAR: HM Pap smear: NORMAL

## 2018-12-23 NOTE — Telephone Encounter (Signed)
Thank you Dr. Yong Channel for responding about patients refills on plavix. Have a good day.

## 2019-01-23 DIAGNOSIS — J3089 Other allergic rhinitis: Secondary | ICD-10-CM | POA: Diagnosis not present

## 2019-01-23 DIAGNOSIS — J300 Vasomotor rhinitis: Secondary | ICD-10-CM | POA: Diagnosis not present

## 2019-01-23 DIAGNOSIS — G4721 Circadian rhythm sleep disorder, delayed sleep phase type: Secondary | ICD-10-CM | POA: Diagnosis not present

## 2019-01-23 DIAGNOSIS — J301 Allergic rhinitis due to pollen: Secondary | ICD-10-CM | POA: Diagnosis not present

## 2019-02-18 IMAGING — MG DIGITAL SCREENING BILATERAL MAMMOGRAM WITH TOMO AND CAD
8 series · 9 of 24 positions shown · non-contrast
Comparison: Previous exam(s).

CLINICAL DATA: Screening.

EXAM:
DIGITAL SCREENING BILATERAL MAMMOGRAM WITH TOMO AND CAD

[R CC synth-2D]
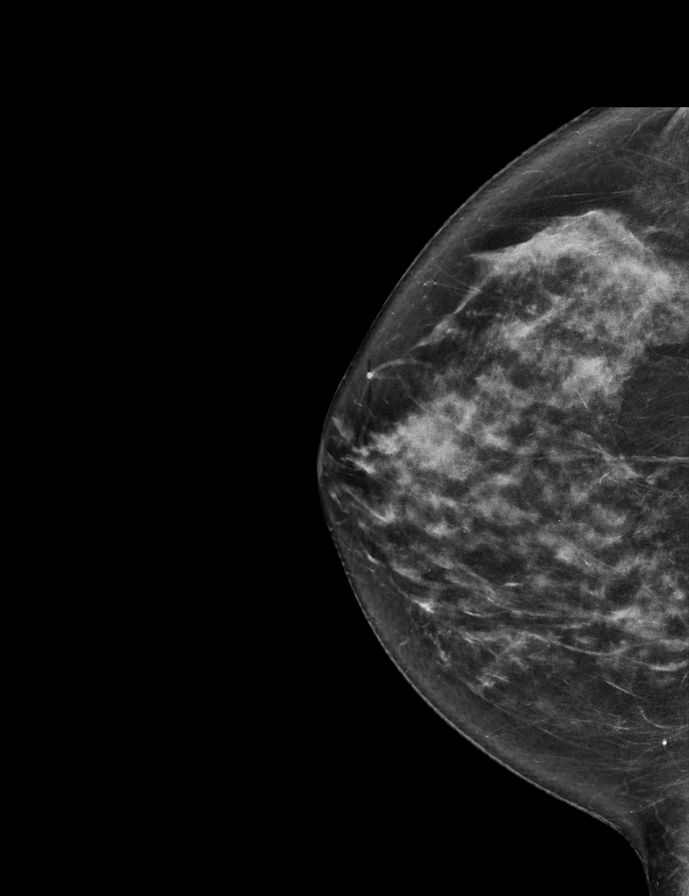

[L CC synth-2D]
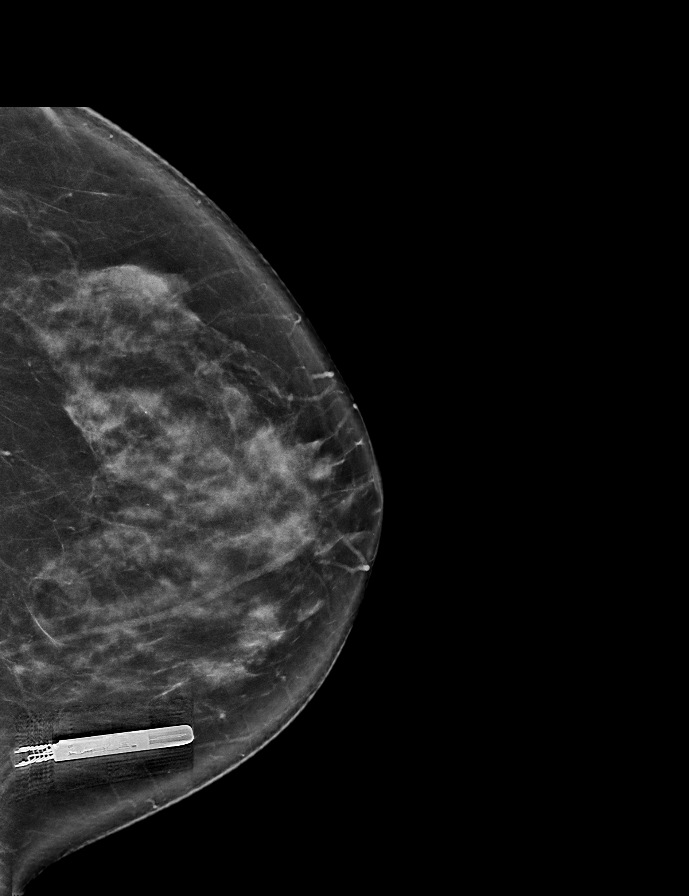

[L MLO synth-2D]
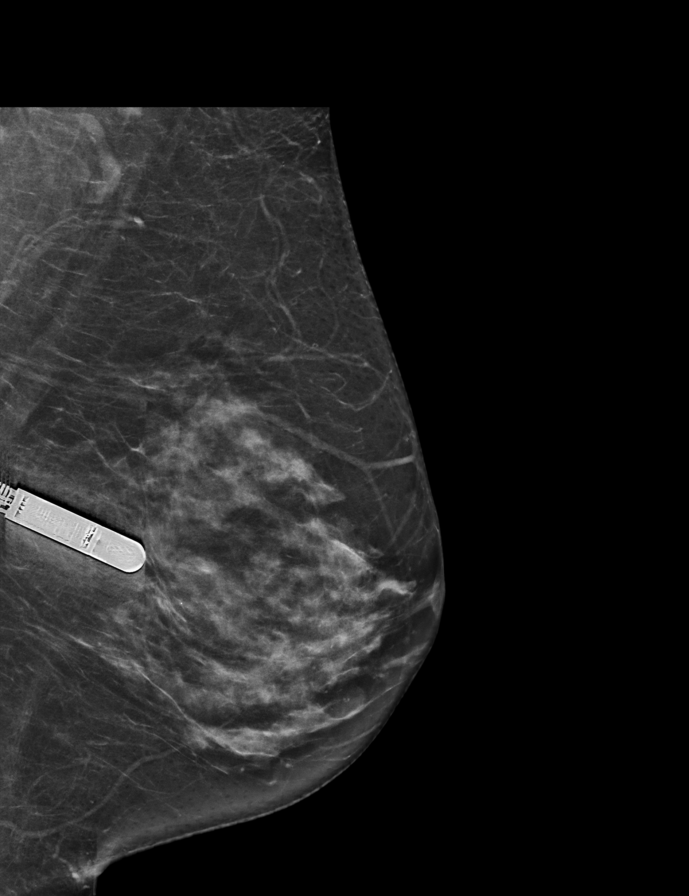

[R MLO synth-2D]
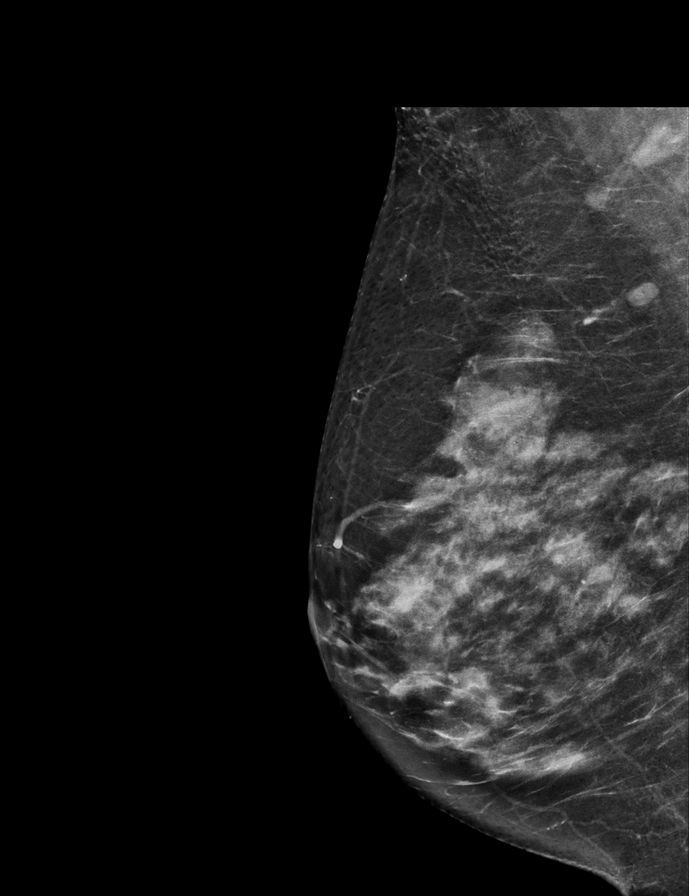

[L CC tomo · 2 of 69 frames shown]
[frame 23/69]
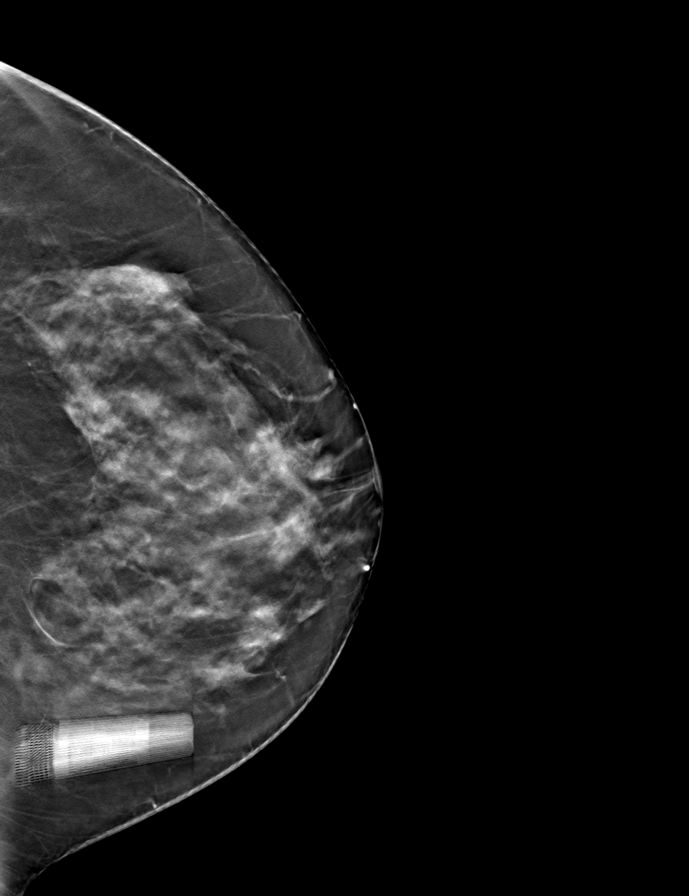
[frame 35/69]
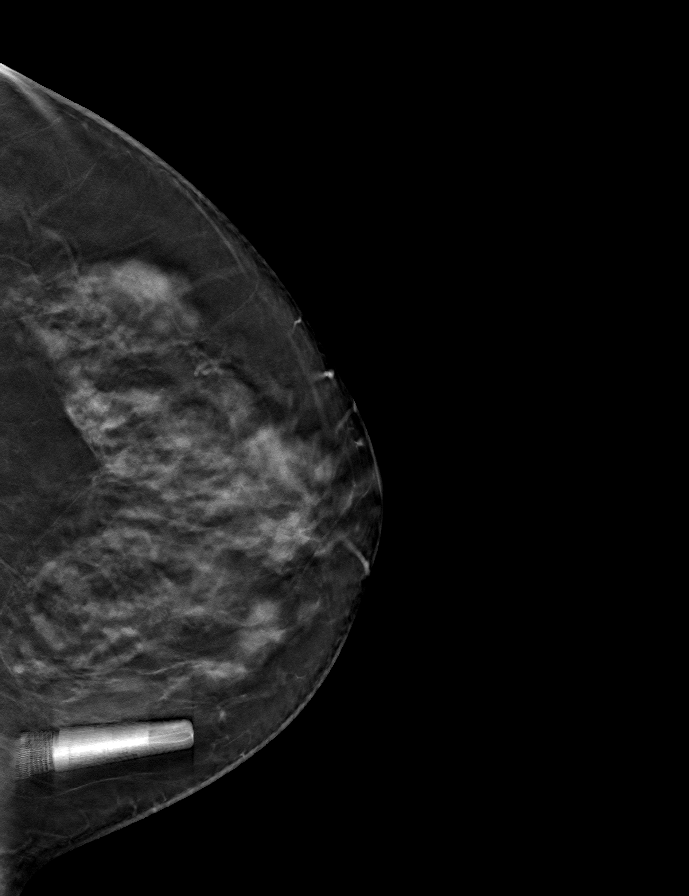

[R MLO tomo · tomo slice 35/69.0]
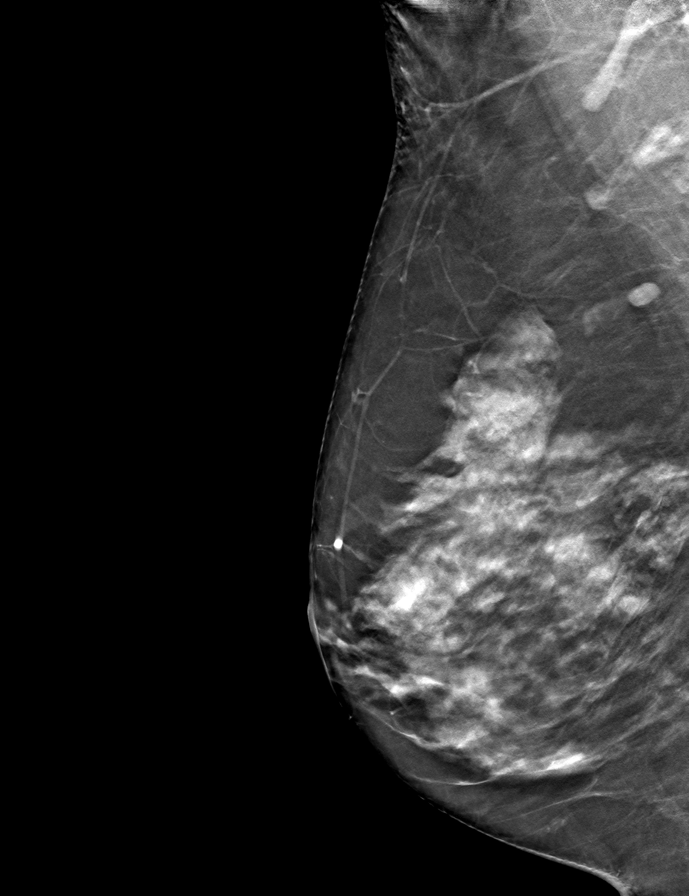

[L MLO tomo · tomo slice 35/68.0]
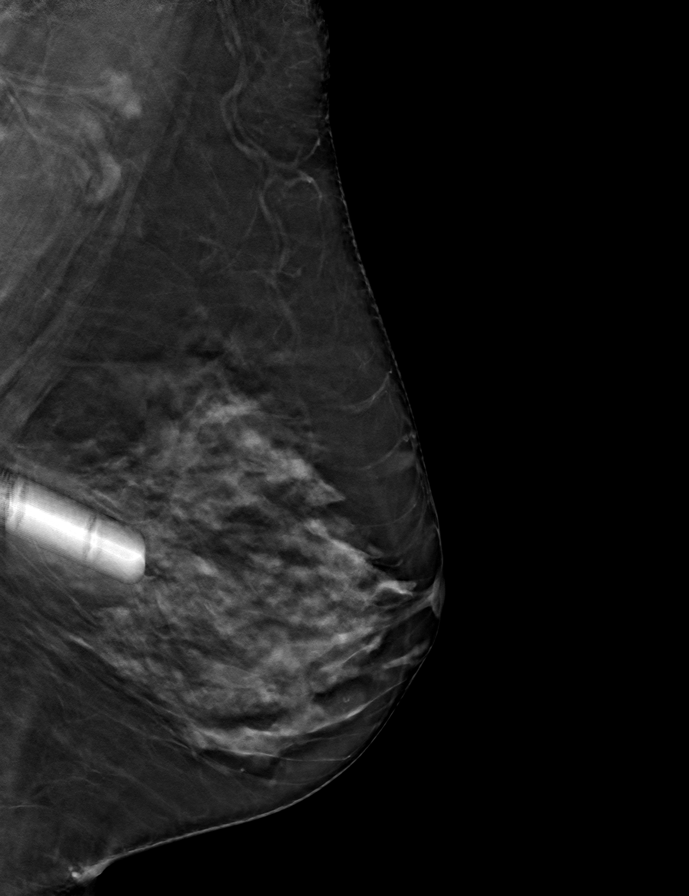

[R CC tomo · tomo slice 33/64.0]
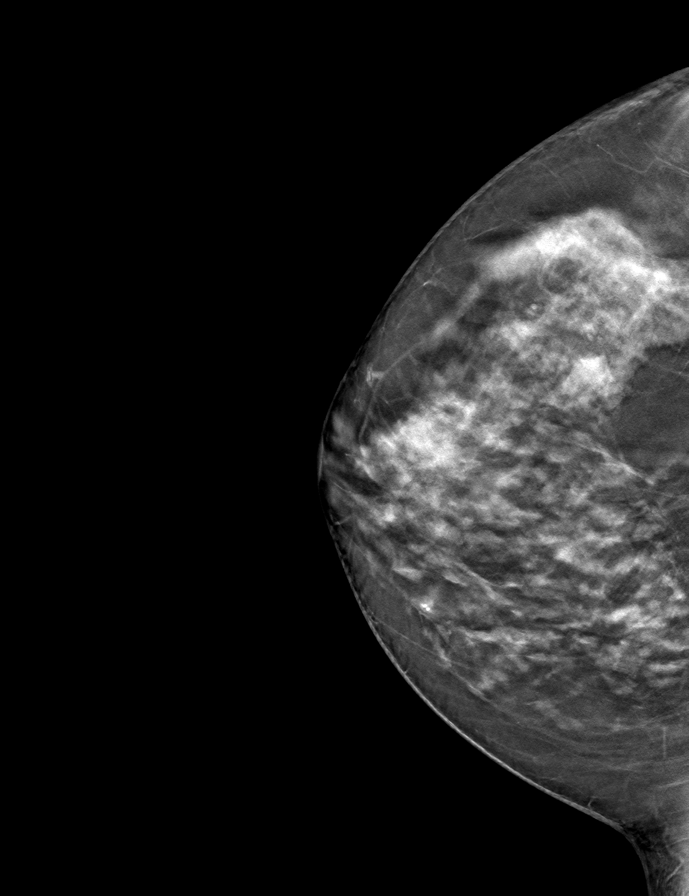

[9 of 24 positions shown; findings below may reference images not displayed]

ACR Breast Density Category c: The breast tissue is heterogeneously
dense, which may obscure small masses.
FINDINGS: There are no findings suspicious for malignancy. Images were
processed with CAD.
IMPRESSION: No mammographic evidence of malignancy. A result letter of this
screening mammogram will be mailed directly to the patient.

RECOMMENDATION:
Screening mammogram in one year. (Code:[id number])

BI-RADS CATEGORY  1: Negative.

## 2019-03-04 ENCOUNTER — Other Ambulatory Visit: Payer: Self-pay

## 2019-03-04 ENCOUNTER — Encounter: Payer: Self-pay | Admitting: Family Medicine

## 2019-03-04 ENCOUNTER — Ambulatory Visit (INDEPENDENT_AMBULATORY_CARE_PROVIDER_SITE_OTHER): Payer: Medicare Other

## 2019-03-04 DIAGNOSIS — Z23 Encounter for immunization: Secondary | ICD-10-CM | POA: Diagnosis not present

## 2019-03-10 ENCOUNTER — Encounter (INDEPENDENT_AMBULATORY_CARE_PROVIDER_SITE_OTHER): Payer: Self-pay

## 2019-04-09 DIAGNOSIS — H25813 Combined forms of age-related cataract, bilateral: Secondary | ICD-10-CM | POA: Diagnosis not present

## 2019-04-15 DIAGNOSIS — G4721 Circadian rhythm sleep disorder, delayed sleep phase type: Secondary | ICD-10-CM | POA: Diagnosis not present

## 2019-04-21 ENCOUNTER — Telehealth: Payer: Self-pay | Admitting: Family Medicine

## 2019-04-21 NOTE — Telephone Encounter (Signed)
I left a message asking the patient and spouse to call and schedule Medicare AWV with Courtney ([facility]).  If patient calls back, please schedule Medicare Wellness Visit (initial) at next available opening.  VDM (Dee-Dee) °

## 2019-04-28 ENCOUNTER — Other Ambulatory Visit: Payer: Self-pay

## 2019-04-28 ENCOUNTER — Ambulatory Visit (INDEPENDENT_AMBULATORY_CARE_PROVIDER_SITE_OTHER): Payer: Medicare Other

## 2019-04-28 VITALS — BP 144/72 | HR 92 | Temp 98.0°F | Ht 62.0 in | Wt 150.2 lb

## 2019-04-28 DIAGNOSIS — Z0000 Encounter for general adult medical examination without abnormal findings: Secondary | ICD-10-CM

## 2019-04-28 NOTE — Progress Notes (Signed)
Subjective:   Brianna Simon is a 68 y.o. female who presents for an Initial Medicare Annual Wellness Visit.  Review of Systems     Cardiac Risk Factors include: advanced age (>22men, >59 women);hypertension    Objective:    Today's Vitals   04/28/19 1456  BP: (!) 144/72  Pulse: 92  Temp: 98 F (36.7 C)  TempSrc: Temporal  SpO2: 96%  Weight: 150 lb 3.2 oz (68.1 kg)  Height: 5\' 2"  (1.575 m)   Body mass index is 27.47 kg/m.  Advanced Directives 04/28/2019 02/27/2015 02/26/2015 10/23/2013  Does Patient Have a Medical Advance Directive? Yes No No Patient does not have advance directive;Patient would not like information  Type of Advance Directive Living will;Healthcare Power of Attorney - - -  Does patient want to make changes to medical advance directive? No - Patient declined - - -  Copy of [place] in Chart? No - copy requested - - -  Pre-existing out of facility DNR order (yellow form or pink MOST form) - - - No    Current Medications (verified) Outpatient Encounter Medications as of 04/28/2019  Medication Sig  . atorvastatin (LIPITOR) 40 MG tablet TAKE 1 TABLET (40 MG TOTAL) BY MOUTH DAILY AT 6 PM.  . azelastine (ASTELIN) 0.1 % nasal spray INSTILL 1 SPRAY IN EACH NOSTRIL TWICE A DAY  . cholecalciferol (VITAMIN D) 1000 units tablet Take 1,000 Units by mouth daily.  . clopidogrel (PLAVIX) 75 MG tablet TAKE 1 TABLET BY MOUTH DAILY  . escitalopram (LEXAPRO) 10 MG tablet TAKE 1 TABLET BY MOUTH EVERY DAY  . famotidine (PEPCID) 20 MG tablet Take 1 tablet (20 mg total) by mouth 2 (two) times daily.  . fluticasone (FLONASE) 50 MCG/ACT nasal spray INSTILL 1 SPRAY INTO EACH NOSTRIL EVERY DAY  . hydrochlorothiazide (HYDRODIURIL) 25 MG tablet TAKE 1/2 TABLET BY MOUTH EVERY DAY  . ipratropium (ATROVENT) 0.06 % nasal spray INSTILL 2 SPRAYS IN EACH NOSTRIL 3 TIMES DAILY AS NEEDED  . levocetirizine (XYZAL) 5 MG tablet Take 5 mg by mouth every morning.  .  montelukast (SINGULAIR) 10 MG tablet Take 1 tablet by mouth at bedtime.  . Multiple Vitamin (MULTIVITAMIN) tablet Take 1 tablet by mouth daily.  . Multiple Vitamins-Minerals (HAIR SKIN AND NAILS FORMULA PO) Take by mouth.  . [DISCONTINUED] fexofenadine (ALLEGRA) 180 MG tablet Take 180 mg by mouth daily.  . [DISCONTINUED] sertraline (ZOLOFT) 100 MG tablet TAKE 1 TABLET BY MOUTH EVERY DAY   No facility-administered encounter medications on file as of 04/28/2019.    Allergies (verified) Citalopram hydrobromide, Codeine phosphate, Nitrofurantoin, Nsaids, and Sulfamethoxazole-trimethoprim   History: Past Medical History:  Diagnosis Date  . Anxiety   . Arthritis    hips, hands, lower back - otc med prn  . Cancer ([place])    skin cancer left wrist and left chest  . GERD (gastroesophageal reflux disease)   . Hx: UTI (urinary tract infection)   . Hyperlipidemia   . Hypertension   . Mitral valve prolapse    does not cause patient any problems  . Seasonal allergies   . Stroke [facility]) 2011   mild  . SVD (spontaneous vaginal delivery)    x 1 - gave up for adoption   Past Surgical History:  Procedure Laterality Date  . COLONOSCOPY    . DILATATION & CURETTAGE/HYSTEROSCOPY WITH TRUECLEAR N/A 10/24/2013   Procedure: DILATATION & CURETTAGE/HYSTEROSCOPY WITH TRUCLEAR;  Surgeon: Logan Bores, MD;  Location: [facility];  Service: Gynecology;  Laterality: N/A;  1 1/2hrs OR time  . EP IMPLANTABLE DEVICE N/A 03/02/2015   Procedure: Loop Recorder Insertion;  Surgeon: Will Meredith Leeds, MD;  Location: [facility];  Service: Cardiovascular;  Laterality: N/A;  . lipoma removed     right shoulder  . SINUS SURGERY WITH INSTATRAK    . WISDOM TOOTH EXTRACTION     Family History  Problem Relation Age of Onset  . Arrhythmia Mother 50       pacemaker   . Stroke Mother   . Stroke Paternal Grandmother   . Diverticulitis Other   . Breast cancer Maternal Aunt   . Breast cancer Cousin    Social  History   Socioeconomic History  . Marital status: Married    Spouse name: Not on file  . Number of children: Not on file  . Years of education: Not on file  . Highest education level: Not on file  Occupational History  . Occupation: [employer]    Employer: [employer]  Tobacco Use  . Smoking status: Former Smoker    Packs/day: 2.00    Years: 30.00    Pack years: 60.00    Types: Cigarettes    Quit date: 03/17/2003    Years since quitting: 16.1  . Smokeless tobacco: Never Used  Substance and Sexual Activity  . Alcohol use: Yes    Alcohol/week: 3.0 standard drinks    Types: 1 Glasses of wine, 1 Cans of beer, 1 Shots of liquor per week    Comment: occasionally  . Drug use: No  . Sexual activity: Yes    Birth control/protection: Post-menopausal  Other Topics Concern  . Not on file  Social History Narrative   Married. No children. Husband patient of Dr. 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       Hobbies: dancing, time out with husband, enjoys concerts   Social Determinants of Health   Financial Resource Strain:   . Difficulty of Paying Living Expenses: Not on file  Food Insecurity:   . Worried About Charity fundraiser in the Last Year: Not on file  . Ran Out of Food in the Last Year: Not on file  Transportation Needs:   . Lack of Transportation (Medical): Not on file  . Lack of Transportation (Non-Medical): Not on file  Physical Activity:   . Days of Exercise per Week: Not on file  . Minutes of Exercise per Session: Not on file  Stress:   . Feeling of Stress : Not on file  Social Connections:   . Frequency of Communication with Friends and Family: Not on file  . Frequency of Social Gatherings with Friends and Family: Not on file  . Attends Religious Services: Not on file  . Active Member of Clubs or Organizations: Not on file  . Attends Archivist Meetings: Not on file  . Marital Status: Not on file    Tobacco  Counseling Counseling given: Not Answered   Clinical Intake:  Pre-visit preparation completed: Yes  Pain : No/denies pain  Diabetes: No  How often do you need to have someone help you when you read instructions, pamphlets, or other written materials from your doctor or pharmacy?: 1 - Never  Interpreter Needed?: No  Information entered by :: Brianna George LPN   Activities of Daily Living In your present state of health, do you have any difficulty performing the following activities: 04/28/2019  Hearing? N  Vision? N  Difficulty concentrating or making decisions? N  Walking or climbing stairs? N  Dressing or bathing? N  Doing errands, shopping? N  Preparing Food and eating ? N  Using the Toilet? N  In the past six months, have you accidently leaked urine? N  Do you have problems with loss of bowel control? N  Managing your Medications? N  Managing your Finances? N  Housekeeping or managing your Housekeeping? N  Some recent data might be hidden     Immunizations and Health Maintenance Immunization History  Administered Date(s) Administered  . Fluad Quad(high Dose 65+) 03/04/2019  . Influenza Split 03/17/2011, 02/09/2012  . Influenza Whole 02/12/2008, 02/22/2010  . Influenza, High Dose Seasonal PF 02/27/2018  . Influenza,inj,Quad PF,6+ Mos 02/10/2013, 03/09/2014, 02/05/2015, 02/25/2016, 02/23/2017  . Pneumococcal Polysaccharide-23 11/05/2018  . Tdap 10/27/2014  . Zoster 04/03/2013  . Zoster Recombinat (Shingrix) 09/26/2016, 12/26/2016   There are no preventive care reminders to display for this patient.  Patient Care Team: Marin Olp, MD as PCP - General (Family Medicine) Paula Compton, MD as Consulting Physician (Obstetrics and Gynecology) Evelina Bucy, DPM as Consulting Physician (Podiatry) Garvin Fila, MD as Consulting Physician (Neurology) [facility] (Ophthalmology)  Indicate any recent Medical Services you may have  received from other than [facility] providers in the past year (date may be approximate).     Assessment:   This is a routine wellness examination for [place].  Hearing/Vision screen No exam data present  Dietary issues and exercise activities discussed: Current Exercise Habits: The patient does not participate in regular exercise at present  Goals   None    Depression Screen PHQ 2/9 Scores 04/28/2019 11/05/2018 03/01/2018  PHQ - 2 Score 2 4 0  PHQ- 9 Score 4 15 -    Fall Risk Fall Risk  04/28/2019 03/01/2018  Falls in the past year? 0 No  Injury with Fall? 0 -  Follow up Falls evaluation completed;Education provided;Falls prevention discussed -    Is the patient's home free of loose throw rugs in walkways, pet beds, electrical cords, etc?   yes      Grab bars in the bathroom? yes      Handrails on the stairs?   yes      Adequate lighting?   yes  Timed Get Up and Go Performed completed and within normal timeframe; no gait abnormalities noted    Cognitive Function: no cognitive concerns at this time    6CIT Screen 04/28/2019  What Year? 0 points  What month? 0 points  What time? 0 points  Count back from 20 0 points  Months in reverse 0 points  Repeat phrase 0 points  Total Score 0    Screening Tests Health Maintenance  Topic Date Due  . PNA vac Low Risk Adult (2 of 2 - PCV13) 11/05/2019  . MAMMOGRAM  11/21/2020  . Fecal DNA (Cologuard)  03/21/2021  . TETANUS/TDAP  10/26/2024  . INFLUENZA VACCINE  Completed  . DEXA SCAN  Completed  . Hepatitis C Screening  Completed    Qualifies for Shingles Vaccine? Shingrix completed   Cancer Screenings: Lung: Low Dose CT Chest recommended if Age 5-80 years, 30 pack-year currently smoking OR have quit w/in 15years. Patient does not qualify. Breast: Up to date on Mammogram? Yes   Up to date of Bone Density/Dexa? Yes Colorectal: Cologuard normal 03/22/18   Plan:  I have personally reviewed and addressed the Medicare Annual  Wellness questionnaire and have noted the following in the patient's chart:  A. Medical and social history B. Use of alcohol, tobacco or illicit drugs  C. Current medications and supplements D. Functional ability and status E.  Nutritional status F.  Physical activity G. Advance directives H. List of other physicians I.  Hospitalizations, surgeries, and ER visits in previous 12 months J.  [place] such as hearing and vision if needed, cognitive and depression L. Referrals, records requested, and appointments- none  In addition, I have reviewed and discussed with patient certain preventive protocols, quality metrics, and best practice recommendations. A written personalized care plan for preventive services as well as general preventive health recommendations were provided to patient.   Signed,  Brianna George, LPN  Nurse Health Advisor   Nurse Notes: no additional

## 2019-04-28 NOTE — Patient Instructions (Signed)
Brianna Simon , Thank you for taking time to come for your Medicare Wellness Visit. I appreciate your ongoing commitment to your health goals. Please review the following plan we discussed and let me know if I can assist you in the future.   Screening recommendations/referrals: Colorectal Screening: up to date; Cologuard 03/22/18 Mammogram: up to date; last 11/22/18 Bone Density: up to date; last 10/02/17   Vision and Dental Exams: Recommended annual ophthalmology exams for early detection of glaucoma and other disorders of the eye Recommended annual dental exams for proper oral hygiene  Vaccinations: Influenza vaccine: completed 03/04/19 Pneumococcal vaccine: up to date; last 11/05/18 Tdap vaccine: up to date; last 10/27/14 Shingles vaccine: Shingrix completed   Advanced directives: Please bring a copy of your POA (Power of [place]) and/or Living Will to your next appointment.  Goals: Recommend to drink at least 6-8 8oz glasses of water per day and consume a balanced diet rich in fresh fruits and vegetables.   Next appointment: Please schedule your Annual Wellness Visit with your Nurse Health Advisor in one year.  Preventive Care 70 Years and Older, Female Preventive care refers to lifestyle choices and visits with your health care provider that can promote health and wellness. What does preventive care include?  A yearly physical exam. This is also called an annual well check.  Dental exams once or twice a year.  Routine eye exams. Ask your health care provider how often you should have your eyes checked.  Personal lifestyle choices, including:  Daily care of your teeth and gums.  Regular physical activity.  Eating a healthy diet.  Avoiding tobacco and drug use.  Limiting alcohol use.  Practicing safe sex.  Taking low-dose aspirin every day if recommended by your health care provider.  Taking vitamin and mineral supplements as recommended by your health care  provider. What happens during an annual well check? The services and screenings done by your health care provider during your annual well check will depend on your age, overall health, lifestyle risk factors, and family history of disease. Counseling  Your health care provider may ask you questions about your:  Alcohol use.  Tobacco use.  Drug use.  Emotional well-being.  Home and relationship well-being.  Sexual activity.  Eating habits.  History of falls.  Memory and ability to understand (cognition).  Work and work Statistician.  Reproductive health. Screening  You may have the following tests or measurements:  Height, weight, and BMI.  Blood pressure.  Lipid and cholesterol levels. These may be checked every 5 years, or more frequently if you are over 57 years old.  Skin check.  Lung cancer screening. You may have this screening every year starting at age 47 if you have a 30-pack-year history of smoking and currently smoke or have quit within the past 15 years.  Fecal occult blood test (FOBT) of the stool. You may have this test every year starting at age 38.  Flexible sigmoidoscopy or colonoscopy. You may have a sigmoidoscopy every 5 years or a colonoscopy every 10 years starting at age 8.  Hepatitis C blood test.  Hepatitis B blood test.  Sexually transmitted disease (STD) testing.  Diabetes screening. This is done by checking your blood sugar (glucose) after you have not eaten for a while (fasting). You may have this done every 1-3 years.  Bone density scan. This is done to screen for osteoporosis. You may have this done starting at age 17.  Mammogram. This may be done every  1-2 years. Talk to your health care provider about how often you should have regular mammograms. Talk with your health care provider about your test results, treatment options, and if necessary, the need for more tests. Vaccines  Your health care provider may recommend certain  vaccines, such as:  Influenza vaccine. This is recommended every year.  Tetanus, diphtheria, and acellular pertussis (Tdap, Td) vaccine. You may need a Td booster every 10 years.  Zoster vaccine. You may need this after age 19.  Pneumococcal 13-valent conjugate (PCV13) vaccine. One dose is recommended after age 49.  Pneumococcal polysaccharide (PPSV23) vaccine. One dose is recommended after age 61. Talk to your health care provider about which screenings and vaccines you need and how often you need them. This information is not intended to replace advice given to you by your health care provider. Make sure you discuss any questions you have with your health care provider. Document Released: 05/28/2015 Document Revised: 01/19/2016 Document Reviewed: 03/02/2015 Elsevier Interactive Patient Education  2017 [place] Prevention in the Home Falls can cause injuries. They can happen to people of all ages. There are many things you can do to make your home safe and to help prevent falls. What can I do on the outside of my home?  Regularly fix the edges of walkways and driveways and fix any cracks.  Remove anything that might make you trip as you walk through a door, such as a raised step or threshold.  Trim any bushes or trees on the path to your home.  Use bright outdoor lighting.  Clear any walking paths of anything that might make someone trip, such as rocks or tools.  Regularly check to see if handrails are loose or broken. Make sure that both sides of any steps have handrails.  Any raised decks and porches should have guardrails on the edges.  Have any leaves, snow, or ice cleared regularly.  Use sand or salt on walking paths during winter.  Clean up any spills in your garage right away. This includes oil or grease spills. What can I do in the bathroom?  Use night lights.  Install grab bars by the toilet and in the tub and shower. Do not use towel bars as grab  bars.  Use non-skid mats or decals in the tub or shower.  If you need to sit down in the shower, use a plastic, non-slip stool.  Keep the floor dry. Clean up any water that spills on the floor as soon as it happens.  Remove soap buildup in the tub or shower regularly.  Attach bath mats securely with double-sided non-slip rug tape.  Do not have throw rugs and other things on the floor that can make you trip. What can I do in the bedroom?  Use night lights.  Make sure that you have a light by your bed that is easy to reach.  Do not use any sheets or blankets that are too big for your bed. They should not hang down onto the floor.  Have a firm chair that has side arms. You can use this for support while you get dressed.  Do not have throw rugs and other things on the floor that can make you trip. What can I do in the kitchen?  Clean up any spills right away.  Avoid walking on wet floors.  Keep items that you use a lot in easy-to-reach places.  If you need to reach something above you, use a strong  step stool that has a grab bar.  Keep electrical cords out of the way.  Do not use floor polish or wax that makes floors slippery. If you must use wax, use non-skid floor wax.  Do not have throw rugs and other things on the floor that can make you trip. What can I do with my stairs?  Do not leave any items on the stairs.  Make sure that there are handrails on both sides of the stairs and use them. Fix handrails that are broken or loose. Make sure that handrails are as long as the stairways.  Check any carpeting to make sure that it is firmly attached to the stairs. Fix any carpet that is loose or worn.  Avoid having throw rugs at the top or bottom of the stairs. If you do have throw rugs, attach them to the floor with carpet tape.  Make sure that you have a light switch at the top of the stairs and the bottom of the stairs. If you do not have them, ask someone to add them for  you. What else can I do to help prevent falls?  Wear shoes that:  Do not have high heels.  Have rubber bottoms.  Are comfortable and fit you well.  Are closed at the toe. Do not wear sandals.  If you use a stepladder:  Make sure that it is fully opened. Do not climb a closed stepladder.  Make sure that both sides of the stepladder are locked into place.  Ask someone to hold it for you, if possible.  Clearly mark and make sure that you can see:  Any grab bars or handrails.  First and last steps.  Where the edge of each step is.  Use tools that help you move around (mobility aids) if they are needed. These include:  Canes.  Walkers.  Scooters.  Crutches.  Turn on the lights when you go into a dark area. Replace any light bulbs as soon as they burn out.  Set up your furniture so you have a clear path. Avoid moving your furniture around.  If any of your floors are uneven, fix them.  If there are any pets around you, be aware of where they are.  Review your medicines with your doctor. Some medicines can make you feel dizzy. This can increase your chance of falling. Ask your doctor what other things that you can do to help prevent falls. This information is not intended to replace advice given to you by your health care provider. Make sure you discuss any questions you have with your health care provider. Document Released: 02/25/2009 Document Revised: 10/07/2015 Document Reviewed: 06/05/2014 Elsevier Interactive Patient Education  2017 [employer].

## 2019-05-05 ENCOUNTER — Other Ambulatory Visit: Payer: Self-pay

## 2019-05-06 ENCOUNTER — Ambulatory Visit (INDEPENDENT_AMBULATORY_CARE_PROVIDER_SITE_OTHER): Payer: Medicare Other | Admitting: Family Medicine

## 2019-05-06 ENCOUNTER — Encounter: Payer: Self-pay | Admitting: Family Medicine

## 2019-05-06 VITALS — BP 120/72 | HR 66 | Temp 98.9°F | Ht 62.0 in | Wt 149.9 lb

## 2019-05-06 DIAGNOSIS — I10 Essential (primary) hypertension: Secondary | ICD-10-CM | POA: Diagnosis not present

## 2019-05-06 DIAGNOSIS — I639 Cerebral infarction, unspecified: Secondary | ICD-10-CM

## 2019-05-06 DIAGNOSIS — K219 Gastro-esophageal reflux disease without esophagitis: Secondary | ICD-10-CM | POA: Diagnosis not present

## 2019-05-06 DIAGNOSIS — F411 Generalized anxiety disorder: Secondary | ICD-10-CM | POA: Diagnosis not present

## 2019-05-06 DIAGNOSIS — E785 Hyperlipidemia, unspecified: Secondary | ICD-10-CM

## 2019-05-06 DIAGNOSIS — G8191 Hemiplegia, unspecified affecting right dominant side: Secondary | ICD-10-CM

## 2019-05-06 DIAGNOSIS — R748 Abnormal levels of other serum enzymes: Secondary | ICD-10-CM | POA: Diagnosis not present

## 2019-05-06 MED ORDER — PANTOPRAZOLE SODIUM 20 MG PO TBEC: 20.0000 mg | DELAYED_RELEASE_TABLET | Freq: Every day | ORAL | 5 refills | Status: DC

## 2019-05-06 NOTE — Progress Notes (Signed)
Phone [phone] In person visit   Subjective:   Brianna Simon is a 68 y.o. year old very pleasant female patient who presents for/with See problem oriented charting Chief Complaint  Patient presents with  . Follow-up    ROS- Review of Systems  Constitutional: Negative.   HENT: Positive for hearing loss.        Has hearing aids   Eyes: Negative.   Respiratory: Negative.   Cardiovascular: Negative.   Gastrointestinal: Positive for heartburn.  Genitourinary: Negative.   Musculoskeletal: Negative.   Skin: Negative.   Neurological: Negative.   Endo/Heme/Allergies: Bruises/bleeds easily.       On Plavix   Psychiatric/Behavioral: Positive for depression.       On medications some improvement     This visit occurred during the SARS-CoV-2 public health emergency.  Safety protocols were in place, including screening questions prior to the visit, additional usage of staff PPE, and extensive cleaning of exam room while observing appropriate contact time as indicated for disinfecting solutions.   Past Medical History-  Patient Active Problem List   Diagnosis Date Noted  . Right hemiplegia ([place]) 05/06/2019    Priority: High  . Cryptogenic stroke ([place]) 03/01/2015    Priority: High  . History of CVA (cerebrovascular accident) 02/22/2010    Priority: High  . Osteopenia 10/02/2017    Priority: Medium  . Former smoker 12/09/2014    Priority: Medium  . Anxiety state 10/27/2014    Priority: Medium  . Hyperlipidemia 02/22/2010    Priority: Medium  . Essential hypertension 12/07/2006    Priority: Medium  . Senile purpura ([place]) 11/05/2018    Priority: Low  . Allergic rhinitis 10/27/2014    Priority: Low  . Esophageal reflux 12/31/2006    Priority: Low  . Insomnia 12/31/2006    Priority: Low  . MVP (mitral valve prolapse) 12/10/2006    Priority: Low  . Hemiplegia affecting dominant side, 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[place]) 03/01/2018  . Benign paroxysmal positional vertigo 11/13/2016   . Low back pain 09/26/2016  . Dysarthria 03/02/2015    Medications- reviewed and updated Current Outpatient Medications  Medication Sig Dispense Refill  . atorvastatin (LIPITOR) 40 MG tablet TAKE 1 TABLET (40 MG TOTAL) BY MOUTH DAILY AT 6 PM. 90 tablet 3  . azelastine (ASTELIN) 0.1 % nasal spray INSTILL 1 SPRAY IN EACH NOSTRIL TWICE A DAY  5  . cholecalciferol (VITAMIN D) 1000 units tablet Take 1,000 Units by mouth daily.    . clopidogrel (PLAVIX) 75 MG tablet TAKE 1 TABLET BY MOUTH DAILY 90 tablet 3  . escitalopram (LEXAPRO) 10 MG tablet TAKE 1 TABLET BY MOUTH EVERY DAY 90 tablet 1  . famotidine (PEPCID) 20 MG tablet Take 1 tablet (20 mg total) by mouth 2 (two) times daily. 180 tablet 3  . hydrochlorothiazide (HYDRODIURIL) 25 MG tablet TAKE 1/2 TABLET BY MOUTH EVERY DAY 45 tablet 1  . ipratropium (ATROVENT) 0.06 % nasal spray INSTILL 2 SPRAYS IN EACH NOSTRIL 3 TIMES DAILY AS NEEDED  5  . levocetirizine (XYZAL) 5 MG tablet Take 5 mg by mouth every morning.    . montelukast (SINGULAIR) 10 MG tablet Take 1 tablet by mouth at bedtime.  5  . Multiple Vitamin (MULTIVITAMIN) tablet Take 1 tablet by mouth daily.    . Multiple Vitamins-Minerals (HAIR SKIN AND NAILS FORMULA PO) Take by mouth.    . pantoprazole (PROTONIX) 20 MG tablet Take 1 tablet (20 mg total) by mouth daily. 30 tablet 5   No  current facility-administered medications for this visit.     Objective:  BP 120/72   Pulse 66   Temp 98.9 F (37.2 C) (Temporal)   Ht 5\' 2"  (1.575 m)   Wt 149 lb 14.4 oz (68 kg)   LMP  (LMP Unknown)   SpO2 97%   BMI 27.42 kg/m  Gen: NAD, resting comfortably RRR CTAB Abdomen: soft/nontender/nondistended/normal bowel sounds. Ext: no edema Skin: warm, dry    Assessment and Plan   # History of CVA with dominant side hemiplegia S:patient compliant with Plavix.  Hemiplegia stable and right hand with right hand numbness and right cheek numbness.  States harder to pick things up. Also notes some  slurred speech if gets into faster pace speech  Patient is in a study for Pradaxa versus aspirin/placebo- she is done with study but not sure what she took.   A/P: hemiplegia stable. Continue plavix and statin as below    # Reflux  S: pepcid twice a day- not helping symptoms. She is taking tums in addition to changes in diet still getting reflux.   A/P: Poor control- considered tagamet but on beers list. We opted to trial protonix again but at 20 mg dose. She will stop the pepcid and let me know if symptoms do not improve with this within a month  # Hyperlipidemia  S:on atorvastatin 40 mg. No issues or problems with medications. She is trying to be more active around the house.  Lab Results  Component Value Date   CHOL 140 03/01/2018   HDL 48 (L) 03/01/2018   LDLCALC 74 03/01/2018   LDLDIRECT 64.0 11/05/2018   TRIG 101 03/01/2018   CHOLHDL 2.9 03/01/2018   A/P: LDL previously at goal 70 or less.  Update lipid panel today  # Hypertension  S:controlled on hydrochlorothiazide 12.5 mg. She checks with cuff at home occasionally. Does not have any issues with it being out of range at home.   BP Readings from Last 3 Encounters:  05/06/19 120/72  04/28/19 (!) 144/72  11/05/18 102/66   A/P:   Stable. Continue current medications.    # Anxiety/depressed mood S:Currently taking Lexapro 10mg . Has tried Clexa in the past but caused blurred vision. Patient declined counseling at last visit. She is not able to tell much improvement with change but denies vision issues. No anhedonia or depressed mood thankfully  She states stress from being with husband constantly- used to be able to get some short reprieves and she misses having flexibility about getting awawy Depression screen [facility] 2/9 05/06/2019 04/28/2019 11/05/2018  Decreased Interest 0 1 2  Down, Depressed, Hopeless 0 1 2  PHQ - 2 Score 0 2 4  Altered sleeping 3 1 3   Tired, decreased energy 3 1 3   Change in appetite 0 0 0  Feeling bad or  failure about yourself  0 0 0  Trouble concentrating 2 0 2  Moving slowly or fidgety/restless 0 0 3  Suicidal thoughts 0 0 0  PHQ-9 Score 8 4 15   Difficult doing work/chores Somewhat difficult - Somewhat difficult  A/P: I would consider this reasonably well-controlled with no anhedonia or depressed mood especially in light of Covid pandemic-continue current medications  #elevated alkaline phosphatase- will get GGT. Has been trending down since 2017. Also check vitamin D with labs  Recommended follow up: Return in about 6 months (around 11/04/2019) for CPE, physical or sooner if needed.   Lab/Order associations:   ICD-10-CM   1. Gastroesophageal reflux disease without  esophagitis  K21.9   2. Hyperlipidemia, unspecified hyperlipidemia type  E78.5 CBC with Differential/Platelet    Comprehensive metabolic panel    Lipid panel    POCT Urinalysis Dipstick (Automated)  3. Essential hypertension  I10 CBC with Differential/Platelet    Comprehensive metabolic panel    Lipid panel    POCT Urinalysis Dipstick (Automated)  4. Anxiety state  F41.1   5. Right hemiplegia ([place])  G81.91    Meds ordered this encounter  Medications  . pantoprazole (PROTONIX) 20 MG tablet    Sig: Take 1 tablet (20 mg total) by mouth daily.    Dispense:  30 tablet    Refill:  5    Return precautions advised.  Garret Reddish, MD

## 2019-05-06 NOTE — Patient Instructions (Addendum)
Please stop by lab before you go If you do not have mychart- we will call you about results within 5 business days of [place] receiving them.  If you have mychart- we will send your results within 3 business days of [place] receiving them.  If abnormal or we want to clarify a result, we will call or mychart you to make sure you receive the message.  If you have questions or concerns or don't hear within 5-7 days, please send [place] a message or call us.   We opted to trial protonix again but at 20 mg dose. She will stop the pepcid and let me know if symptoms do not improve with this within a month  Recommended follow up: Return in about 6 months (around 11/04/2019) for CPE, physical or sooner if needed.

## 2019-05-07 LAB — CBC WITH DIFFERENTIAL/PLATELET
Basophils Absolute: 0 10*3/uL (ref 0.0–0.1)
Basophils Relative: 0.6 % (ref 0.0–3.0)
Eosinophils Absolute: 0.1 10*3/uL (ref 0.0–0.7)
Eosinophils Relative: 2.7 % (ref 0.0–5.0)
HCT: 42.9 % (ref 36.0–46.0)
Hemoglobin: 14.6 g/dL (ref 12.0–15.0)
Lymphocytes Relative: 28.6 % (ref 12.0–46.0)
Lymphs Abs: 1.5 10*3/uL (ref 0.7–4.0)
MCHC: 33.9 g/dL (ref 30.0–36.0)
MCV: 90.4 fl (ref 78.0–100.0)
Monocytes Absolute: 0.5 10*3/uL (ref 0.1–1.0)
Monocytes Relative: 9.8 % (ref 3.0–12.0)
Neutro Abs: 3.1 10*3/uL (ref 1.4–7.7)
Neutrophils Relative %: 58.3 % (ref 43.0–77.0)
Platelets: 271 10*3/uL (ref 150.0–400.0)
RBC: 4.75 Mil/uL (ref 3.87–5.11)
RDW: 13.5 % (ref 11.5–15.5)
WBC: 5.3 10*3/uL (ref 4.0–10.5)

## 2019-05-07 LAB — COMPREHENSIVE METABOLIC PANEL
ALT: 26 U/L (ref 0–35)
AST: 19 U/L (ref 0–37)
Albumin: 3.9 g/dL (ref 3.5–5.2)
Alkaline Phosphatase: 133 U/L — ABNORMAL HIGH (ref 39–117)
BUN: 15 mg/dL (ref 6–23)
CO2: 27 mEq/L (ref 19–32)
Calcium: 9.3 mg/dL (ref 8.4–10.5)
Chloride: 105 mEq/L (ref 96–112)
Creatinine, Ser: 0.71 mg/dL (ref 0.40–1.20)
GFR: 82.24 mL/min (ref >60.00)
Glucose, Bld: 80 mg/dL (ref 70–99)
Potassium: 3.9 mEq/L (ref 3.5–5.1)
Sodium: 141 mEq/L (ref 135–145)
Total Bilirubin: 0.4 mg/dL (ref 0.2–1.2)
Total Protein: 6.2 g/dL (ref 6.0–8.3)

## 2019-05-07 LAB — LIPID PANEL
Cholesterol: 114 mg/dL (ref 0–200)
HDL: 38.3 mg/dL — ABNORMAL LOW (ref >39.00)
LDL Cholesterol: 53 mg/dL (ref 0–99)
NonHDL: 75.56
Total CHOL/HDL Ratio: 3
Triglycerides: 111 mg/dL (ref 0.0–149.0)
VLDL: 22.2 mg/dL (ref 0.0–40.0)

## 2019-05-07 LAB — GAMMA GT: GGT: 22 U/L (ref 7–51)

## 2019-05-07 LAB — VITAMIN D 25 HYDROXY (VIT D DEFICIENCY, FRACTURES): VITD: 41.65 ng/mL (ref 30.00–100.00)

## 2019-05-08 ENCOUNTER — Telehealth: Payer: Self-pay | Admitting: Family Medicine

## 2019-05-08 NOTE — Telephone Encounter (Signed)
Pt returned call.  Copied from [place] [id number]. Topic: Quick Communication - Lab Results (Clinic Use ONLY) >> May 08, 2019  1:44 PM Angela Adam, [place] wrote: Called patient to inform them of 05/06/2019 lab results. When patient returns call, triage nurse may disclose results.

## 2019-05-08 NOTE — Telephone Encounter (Signed)
Patient returning call for lab results. NT unavailable.

## 2019-05-08 NOTE — Telephone Encounter (Signed)
Patient called. LVM to return call for results.

## 2019-05-19 ENCOUNTER — Telehealth: Payer: Self-pay | Admitting: Family Medicine

## 2019-05-19 NOTE — Telephone Encounter (Signed)
Copied from [place] [phone]. Topic: General - Other >> May 19, 2019  3:26 PM Keene Breath wrote: Reason for CRM: Patient is is going to see her own Endo specialist and stated that she will not need a referral.  If she has any issues she will call the office back

## 2019-05-23 ENCOUNTER — Other Ambulatory Visit: Payer: Self-pay | Admitting: Family Medicine

## 2019-05-23 NOTE — Telephone Encounter (Signed)
Last OV 05/06/19 Last refill 11/29/18 #45/1 Next OV 11/14/19

## 2019-05-30 ENCOUNTER — Telehealth: Payer: Self-pay | Admitting: Family Medicine

## 2019-05-30 NOTE — Telephone Encounter (Signed)
Pt called asking for referral to see Renato Shin for endocrinology. Please advise.

## 2019-05-30 NOTE — Telephone Encounter (Signed)
What is the indication? For referral

## 2019-05-30 NOTE — Telephone Encounter (Signed)
Ok to do referral?  

## 2019-06-01 ENCOUNTER — Other Ambulatory Visit: Payer: Self-pay | Admitting: Family Medicine

## 2019-06-02 ENCOUNTER — Other Ambulatory Visit: Payer: Self-pay

## 2019-06-02 DIAGNOSIS — R748 Abnormal levels of other serum enzymes: Secondary | ICD-10-CM

## 2019-06-02 NOTE — Telephone Encounter (Signed)
Left message to return call to our office.  

## 2019-06-02 NOTE — Telephone Encounter (Signed)
Spoke to patient referral placed under request from last labs due to elevated alkaline phosphatase . Patient will call office if she had not heard from them in 2 weeks.

## 2019-06-13 ENCOUNTER — Other Ambulatory Visit: Payer: Self-pay

## 2019-06-16 ENCOUNTER — Ambulatory Visit (INDEPENDENT_AMBULATORY_CARE_PROVIDER_SITE_OTHER): Payer: Medicare Other | Admitting: Endocrinology

## 2019-06-16 ENCOUNTER — Encounter: Payer: Self-pay | Admitting: Endocrinology

## 2019-06-16 ENCOUNTER — Other Ambulatory Visit: Payer: Self-pay

## 2019-06-16 DIAGNOSIS — R748 Abnormal levels of other serum enzymes: Secondary | ICD-10-CM

## 2019-06-16 NOTE — Patient Instructions (Addendum)
Blood tests are requested for you today.  We'll let you know about the results.   If one of the 3 types of the alkaline phosphatase is high, we'll then test that part of the body.   If all 3 are normal, Please come back for a follow-up appointment in 1 year.

## 2019-06-16 NOTE — Progress Notes (Signed)
Subjective:    Patient ID: Brianna Simon, female    DOB: [birth year], 69 y.o.   MRN: [id number]  HPI Pt is ref by Dr Yong Channel for elev AP (dx'ed 2017).  She has osteopenia.  She has no h/o liver dz, GB dz, GI dz, or alcoholism.  Only bony fx was left shoulder in 1990.    Past Medical History:  Diagnosis Date  . Anxiety   . Arthritis    hips, hands, lower back - otc med prn  . Cancer ([place])    skin cancer left wrist and left chest  . GERD (gastroesophageal reflux disease)   . Hx: UTI (urinary tract infection)   . Hyperlipidemia   . Hypertension   . Mitral valve prolapse    does not cause patient any problems  . Seasonal allergies   . Stroke [facility]) 2011   mild  . SVD (spontaneous vaginal delivery)    x 1 - gave up for adoption    Past Surgical History:  Procedure Laterality Date  . COLONOSCOPY    . DILATATION & CURETTAGE/HYSTEROSCOPY WITH TRUECLEAR N/A 10/24/2013   Procedure: DILATATION & CURETTAGE/HYSTEROSCOPY WITH TRUCLEAR;  Surgeon: Logan Bores, MD;  Location: [place] [facility];  Service: Gynecology;  Laterality: N/A;  1 1/2hrs OR time  . EP IMPLANTABLE DEVICE N/A 03/02/2015   Procedure: Loop Recorder Insertion;  Surgeon: Will Meredith Leeds, MD;  Location: [place] [facility];  Service: Cardiovascular;  Laterality: N/A;  . lipoma removed     right shoulder  . SINUS SURGERY WITH INSTATRAK    . WISDOM TOOTH EXTRACTION      Social History   Socioeconomic History  . Marital status: Married    Spouse name: Not on file  . Number of children: Not on file  . Years of education: Not on file  . Highest education level: Not on file  Occupational History  . Occupation: [employer]    Employer: [employer]  Tobacco Use  . Smoking status: Former Smoker    Packs/day: 2.00    Years: 30.00    Pack years: 60.00    Types: Cigarettes    Quit date: 03/17/2003    Years since quitting: 16.2  . Smokeless tobacco: Never Used  Substance and Sexual Activity  . Alcohol use: Yes      Alcohol/week: 3.0 standard drinks    Types: 1 Glasses of wine, 1 Cans of beer, 1 Shots of liquor per week    Comment: occasionally  . Drug use: No  . Sexual activity: Yes    Birth control/protection: Post-menopausal  Other Topics Concern  . Not on file  Social History Narrative   Married. No children. Husband patient of Dr. 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       Hobbies: dancing, time out with husband, enjoys concerts   Social Determinants of Health   Financial Resource Strain:   . Difficulty of Paying Living Expenses: Not on file  Food Insecurity:   . Worried About Charity fundraiser in the Last Year: Not on file  . Ran Out of Food in the Last Year: Not on file  Transportation Needs:   . Lack of Transportation (Medical): Not on file  . Lack of Transportation (Non-Medical): Not on file  Physical Activity:   . Days of Exercise per Week: Not on file  . Minutes of Exercise per Session: Not on file  Stress:   . Feeling  of Stress : Not on file  Social Connections:   . Frequency of Communication with Friends and Family: Not on file  . Frequency of Social Gatherings with Friends and Family: Not on file  . Attends Religious Services: Not on file  . Active Member of Clubs or Organizations: Not on file  . Attends Archivist Meetings: Not on file  . Marital Status: Not on file  Intimate Partner Violence:   . Fear of Current or Ex-Partner: Not on file  . Emotionally Abused: Not on file  . Physically Abused: Not on file  . Sexually Abused: Not on file    Current Outpatient Medications on File Prior to Visit  Medication Sig Dispense Refill  . atorvastatin (LIPITOR) 40 MG tablet TAKE 1 TABLET (40 MG TOTAL) BY MOUTH DAILY AT 6 PM. 90 tablet 3  . azelastine (ASTELIN) 0.1 % nasal spray INSTILL 1 SPRAY IN EACH NOSTRIL TWICE A DAY  5  . cholecalciferol (VITAMIN D) 1000 units tablet Take 2,000 Units by mouth daily.     . clopidogrel (PLAVIX) 75  MG tablet TAKE 1 TABLET BY MOUTH DAILY 90 tablet 3  . escitalopram (LEXAPRO) 10 MG tablet TAKE 1 TABLET BY MOUTH EVERY DAY 90 tablet 1  . fluticasone (FLONASE) 50 MCG/ACT nasal spray Place 1 spray into both nostrils daily.    . hydrochlorothiazide (HYDRODIURIL) 25 MG tablet TAKE 1/2 TABLET BY MOUTH EVERY DAY 45 tablet 1  . ipratropium (ATROVENT) 0.06 % nasal spray INSTILL 2 SPRAYS IN EACH NOSTRIL 3 TIMES DAILY AS NEEDED  5  . levocetirizine (XYZAL) 5 MG tablet Take 5 mg by mouth every morning.    . Melatonin 1 MG TABS Take 5 mg by mouth daily.    . montelukast (SINGULAIR) 10 MG tablet Take 1 tablet by mouth at bedtime.  5  . Multiple Vitamin (MULTIVITAMIN) tablet Take 1 tablet by mouth daily.    . pantoprazole (PROTONIX) 20 MG tablet Take 20 mg by mouth daily.     No current facility-administered medications on file prior to visit.    Allergies  Allergen Reactions  . Citalopram Hydrobromide     REACTION: blurred vision  . Codeine Phosphate     Liquid formulation only bothers her; tolerates percocet fine.  REACTION: nausea,vomiting  . Nitrofurantoin     REACTION: itching  . Nsaids Other (See Comments)    Elevated liver panel  . Sulfamethoxazole-Trimethoprim     Bactrim/REACTION: itching    Family History  Problem Relation Age of Onset  . Arrhythmia Mother 40       pacemaker   . Stroke Mother   . Stroke Paternal Grandmother   . Diverticulitis Other   . Breast cancer Maternal Aunt   . Breast cancer Cousin     BP 124/62 (BP Location: Left Arm, Patient Position: Sitting, Cuff Size: Large)   Pulse 89   Ht 5\' 2"  (1.575 m)   Wt 148 lb 12.8 oz (67.5 kg)   LMP  (LMP Unknown)   SpO2 94%   BMI 27.22 kg/m    Review of Systems denies weight loss, diarrhea, abd pain, arthralgias, cold intolerance, edema, skin rash, falls, cramps, and memory loss.  She has chronic sxs of heartburn, back pain, and insomnia.     Objective:   Physical Exam VS: see vs page GEN: no distress HEAD:  head: no deformity eyes: no periorbital swelling, no proptosis external nose and ears are normal NECK: supple, thyroid is not enlarged CHEST  WALL: no deformity LUNGS: clear to auscultation CV: reg rate and rhythm, no murmur ABD: abdomen is soft, nontender.  no hepatosplenomegaly.  not distended.  no hernia MUSCULOSKELETAL: muscle bulk and strength are grossly normal.  no obvious joint swelling.  gait is normal and steady EXTEMITIES: no deformity.  no edema PULSES: no carotid bruit NEURO:  cn 2-12 grossly intact.   readily moves all 4's.  sensation is intact to touch on all 4's SKIN:  Normal texture and temperature.  No rash or suspicious lesion is visible.   NODES:  None palpable at the neck PSYCH: alert, well-oriented.  Does not appear anxious nor depressed.    Lab Results  Component Value Date   ALT 26 05/06/2019   AST 19 05/06/2019   ALKPHOS 133 (H) 05/06/2019   BILITOT 0.4 05/06/2019   I have reviewed outside records, and summarized: Pt was noted to have elevated AP, and referred here.  CVA, GERD, HTN, anxiety, and dyslipidemia were also addressed.      Assessment & Plan:  elev AP, new to me: as it was worse in 2017, prob not a serious problem.  If AP isoenzymes are abnormal, most likely dx is Paget's.   Patient Instructions  Blood tests are requested for you today.  We'll let you know about the results.   If one of the 3 types of the alkaline phosphatase is high, we'll then test that part of the body.   If all 3 are normal, Please come back for a follow-up appointment in 1 year.

## 2019-06-19 LAB — ALKALINE PHOSPHATASE ISOENZYMES
Alkaline phosphatase (APISO): 146 U/L (ref 37–153)
Bone Isoenzymes: 30 % (ref 28–66)
Intestinal Isoenzymes: 7 % (ref 1–24)
Liver Isoenzymes: 63 % (ref 25–69)

## 2019-10-06 ENCOUNTER — Telehealth: Payer: Self-pay | Admitting: Family Medicine

## 2019-10-06 NOTE — Telephone Encounter (Signed)
Patient received her [place] on February 2nd for the first one then the second one on February 23.

## 2019-10-06 NOTE — Telephone Encounter (Signed)
Noted  

## 2019-10-12 ENCOUNTER — Other Ambulatory Visit: Payer: Self-pay | Admitting: Family Medicine

## 2019-10-16 DIAGNOSIS — G4721 Circadian rhythm sleep disorder, delayed sleep phase type: Secondary | ICD-10-CM | POA: Diagnosis not present

## 2019-11-09 ENCOUNTER — Other Ambulatory Visit: Payer: Self-pay | Admitting: Family Medicine

## 2019-11-14 ENCOUNTER — Encounter: Payer: Self-pay | Admitting: Family Medicine

## 2019-11-14 ENCOUNTER — Other Ambulatory Visit: Payer: Self-pay

## 2019-11-14 ENCOUNTER — Ambulatory Visit (INDEPENDENT_AMBULATORY_CARE_PROVIDER_SITE_OTHER): Payer: Medicare Other | Admitting: Family Medicine

## 2019-11-14 ENCOUNTER — Other Ambulatory Visit: Payer: Self-pay | Admitting: Obstetrics and Gynecology

## 2019-11-14 VITALS — BP 112/62 | HR 64 | Temp 98.8°F | Ht 62.0 in | Wt 147.0 lb

## 2019-11-14 DIAGNOSIS — G8191 Hemiplegia, unspecified affecting right dominant side: Secondary | ICD-10-CM

## 2019-11-14 DIAGNOSIS — F321 Major depressive disorder, single episode, moderate: Secondary | ICD-10-CM | POA: Diagnosis not present

## 2019-11-14 DIAGNOSIS — Z8673 Personal history of transient ischemic attack (TIA), and cerebral infarction without residual deficits: Secondary | ICD-10-CM | POA: Diagnosis not present

## 2019-11-14 DIAGNOSIS — F411 Generalized anxiety disorder: Secondary | ICD-10-CM

## 2019-11-14 DIAGNOSIS — Z87891 Personal history of nicotine dependence: Secondary | ICD-10-CM | POA: Diagnosis not present

## 2019-11-14 DIAGNOSIS — E785 Hyperlipidemia, unspecified: Secondary | ICD-10-CM | POA: Diagnosis not present

## 2019-11-14 DIAGNOSIS — D692 Other nonthrombocytopenic purpura: Secondary | ICD-10-CM

## 2019-11-14 DIAGNOSIS — Z23 Encounter for immunization: Secondary | ICD-10-CM

## 2019-11-14 DIAGNOSIS — I10 Essential (primary) hypertension: Secondary | ICD-10-CM

## 2019-11-14 DIAGNOSIS — Z1231 Encounter for screening mammogram for malignant neoplasm of breast: Secondary | ICD-10-CM

## 2019-11-14 DIAGNOSIS — Z79899 Other long term (current) drug therapy: Secondary | ICD-10-CM | POA: Diagnosis not present

## 2019-11-14 DIAGNOSIS — F325 Major depressive disorder, single episode, in full remission: Secondary | ICD-10-CM | POA: Insufficient documentation

## 2019-11-14 LAB — POC URINALSYSI DIPSTICK (AUTOMATED)
Bilirubin, UA: NEGATIVE
Blood, UA: NEGATIVE
Glucose, UA: NEGATIVE
Ketones, UA: NEGATIVE
Leukocytes, UA: NEGATIVE
Nitrite, UA: NEGATIVE
Protein, UA: NEGATIVE
Spec Grav, UA: 1.015 (ref 1.010–1.025)
Urobilinogen, UA: 0.2 E.U./dL
pH, UA: 5.5 (ref 5.0–8.0)

## 2019-11-14 MED ORDER — ESCITALOPRAM OXALATE 10 MG PO TABS: 15.0000 mg | ORAL_TABLET | Freq: Every day | ORAL | 1 refills | Status: DC

## 2019-11-14 NOTE — Addendum Note (Signed)
Addended by: Loralyn Freshwater on: 11/14/2019 03:37 PM   Modules accepted: Orders

## 2019-11-14 NOTE — Addendum Note (Signed)
Addended by: Doran Clay A on: 11/14/2019 03:28 PM   Modules accepted: Orders

## 2019-11-14 NOTE — Patient Instructions (Addendum)
Health Maintenance Due  Topic Date Due  . PNA vac Low Risk Adult (2 of 2 - PCV13)- today with labs 11/05/2019   Poor control of depression/anxiety. We are going to increase lexapro to 15mg . Also encouraged her to consider counseling/therapy: Please call [phone] to schedule a visit with [place] [facility] -Trey Paula is an excellent counselor who is based out of our clinic  Recommended follow up: Return in about 6 months (around 05/16/2020) for follow up- or sooner if needed.  -particularly want to see you sooner if anxiety/depression not improving

## 2019-11-14 NOTE — Assessment & Plan Note (Signed)
S: Medication:lexapro 10mg .  Reports poor control of depression associated with some anxiety. A lot of stress with her husband at home Depression screen [facility] 2/9 11/14/2019 05/06/2019 04/28/2019  Decreased Interest 1 0 1  Down, Depressed, Hopeless 1 0 1  PHQ - 2 Score 2 0 2  Altered sleeping 3 3 1   Tired, decreased energy 1 3 1   Change in appetite 1 0 0  Feeling bad or failure about yourself  0 0 0  Trouble concentrating 1 2 0  Moving slowly or fidgety/restless 2 0 0  Suicidal thoughts 0 0 0  PHQ-9 Score 10 8 4   Difficult doing work/chores Somewhat difficult Somewhat difficult -  A/P: Poor control of depression/anxiety. We are going to increase lexapro to 15mg . Also encouraged her to consider counseling/therapy: Please call [phone] to schedule a visit with [place] [facility] -Trey Paula is an excellent counselor who is based out of our clinic

## 2019-11-14 NOTE — Addendum Note (Signed)
Addended by: Doran Clay A on: 11/14/2019 03:27 PM   Modules accepted: Orders

## 2019-11-14 NOTE — Progress Notes (Signed)
Phone [phone] In person visit   Subjective:   Brianna Simon is a 69 y.o. year old very pleasant female patient who presents for/with See problem oriented charting Chief Complaint  Patient presents with  . Hypertension   This visit occurred during the SARS-CoV-2 public health emergency.  Safety protocols were in place, including screening questions prior to the visit, additional usage of staff PPE, and extensive cleaning of exam room while observing appropriate contact time as indicated for disinfecting solutions.   Past Medical History-  Patient Active Problem List   Diagnosis Date Noted  . Right hemiplegia ([place]) 05/06/2019    Priority: High  . History of CVA (cerebrovascular accident) 02/22/2010    Priority: High  . Osteopenia 10/02/2017    Priority: Medium  . Former smoker 12/09/2014    Priority: Medium  . Anxiety state 10/27/2014    Priority: Medium  . Hyperlipidemia 02/22/2010    Priority: Medium  . Essential hypertension 12/07/2006    Priority: Medium  . Senile purpura ([place]) 11/05/2018    Priority: Low  . Allergic rhinitis 10/27/2014    Priority: Low  . Esophageal reflux 12/31/2006    Priority: Low  . Insomnia 12/31/2006    Priority: Low  . MVP (mitral valve prolapse) 12/10/2006    Priority: Low  . Depression, major, single episode, moderate ([place]) 11/14/2019  . Alkaline phosphatase elevation 06/16/2019  . 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 Benign paroxysmal positional vertigo 11/13/2016  . Low back pain 09/26/2016  . Dysarthria 03/02/2015    Medications- reviewed and updated Current Outpatient Medications  Medication Sig Dispense Refill  . atorvastatin (LIPITOR) 40 MG tablet TAKE 1 TABLET (40 MG TOTAL) BY MOUTH DAILY AT 6 PM. 90 tablet 3  . azelastine (ASTELIN) 0.1 % nasal spray INSTILL 1 SPRAY IN EACH NOSTRIL TWICE A DAY  5  . cholecalciferol (VITAMIN D) 1000 units tablet Take 2,000 Units by mouth daily.     .  clopidogrel (PLAVIX) 75 MG tablet TAKE 1 TABLET BY MOUTH DAILY 90 tablet 3  . fluticasone (FLONASE) 50 MCG/ACT nasal spray Place 1 spray into both nostrils daily.    . hydrochlorothiazide (HYDRODIURIL) 25 MG tablet TAKE 1/2 TABLET BY MOUTH EVERY DAY 45 tablet 1  . ipratropium (ATROVENT) 0.06 % nasal spray INSTILL 2 SPRAYS IN EACH NOSTRIL 3 TIMES DAILY AS NEEDED  5  . levocetirizine (XYZAL) 5 MG tablet Take 5 mg by mouth every morning.    . Melatonin 1 MG TABS Take 5 mg by mouth daily.    . montelukast (SINGULAIR) 10 MG tablet Take 1 tablet by mouth at bedtime.  5  . Multiple Vitamin (MULTIVITAMIN) tablet Take 1 tablet by mouth daily.    . pantoprazole (PROTONIX) 20 MG tablet TAKE 1 TABLET BY MOUTH EVERY DAY 90 tablet 1  . escitalopram (LEXAPRO) 10 MG tablet Take 1.5 tablets (15 mg total) by mouth daily. 135 tablet 1   No current facility-administered medications for this visit.     Objective:  BP 112/62   Pulse 64   Temp 98.8 F (37.1 C)   Ht 5\' 2"  (1.575 m)   Wt 147 lb (66.7 kg)   LMP  (LMP Unknown)   SpO2 97%   BMI 26.89 kg/m  Gen: NAD, resting comfortably CV: RRR no murmurs rubs or gallops Lungs: CTAB no crackles, wheeze, rhonchi Abdomen: soft/nontender/nondistended Ext: no edema Skin: warm, dry     Assessment and Plan    #elevated alkaline  phosphatase.  Work-up has been reassuring.  Saw endocrinology and had extensive blood work-this was reassuring and plan is for 1 year follow-up with Dr. Loanne Drilling Lab Results  Component Value Date   ALT 26 05/06/2019   AST 19 05/06/2019   ALKPHOS 133 (H) 05/06/2019   BILITOT 0.4 05/06/2019   #hypertension S: medication: hctz 25 mg Home readings #s: does not check BP Readings from Last 3 Encounters:  11/14/19 112/62  06/16/19 124/62  05/06/19 120/72  A/P: Stable. Continue current medications.   #hyperlipidemia/history of CVA crytogenic- still has loop recorder but data not transmitted- she states has option to keep in or leave  out/ right hemiplegia- dominant side related to stroke- stable numbness.  S: Medication:atorvastatin 40mg , plavix 40mg  for stroke prevention  Lab Results  Component Value Date   CHOL 114 05/06/2019   HDL 38.30 (L) 05/06/2019   LDLCALC 53 05/06/2019   LDLDIRECT 64.0 11/05/2018   TRIG 111.0 05/06/2019   CHOLHDL 3 05/06/2019   A/P: reasonable control on last check- check direct LDL only today   # Depression- not in remission/anxiety S: Medication:lexapro 10mg .  Reports poor control of depression associated with some anxiety. A lot of stress with her husband at home Depression screen [facility] 2/9 11/14/2019 05/06/2019 04/28/2019  Decreased Interest 1 0 1  Down, Depressed, Hopeless 1 0 1  PHQ - 2 Score 2 0 2  Altered sleeping 3 3 1   Tired, decreased energy 1 3 1   Change in appetite 1 0 0  Feeling bad or failure about yourself  0 0 0  Trouble concentrating 1 2 0  Moving slowly or fidgety/restless 2 0 0  Suicidal thoughts 0 0 0  PHQ-9 Score 10 8 4   Difficult doing work/chores Somewhat difficult Somewhat difficult -  A/P: Poor control of depression/anxiety. We are going to increase lexapro to 15mg . Also encouraged her to consider counseling/therapy: Please call [phone] to schedule a visit with [place] [facility] -Trey Paula is an excellent counselor who is based out of our clinic  # GERD S:pantoprazole 20 mg- if skips a dose has significant worsening A/P: reasonable control on medicine-usually I would like to reduce or eliminate dose but given her significant issues when she misses a dose we will continue current medication.  With long-term PPI use this can lower B12 level so we will check a B12 level with labs today  No results found for: VITAMINB12   #senile purpura- notes on plavix- update cbc with labs  Recommended follow up: Return in about 6 months (around 05/16/2020) for follow up- or sooner if needed. Future Appointments  Date Time Provider [facility]  01/22/2020   3:30 PM GI-[facility] MM 2 [facility]    Lab/Order associations:   ICD-10-CM   1. Essential hypertension  I10 LDL cholesterol, direct    CBC with Differential/Platelet    Comprehensive metabolic panel  2. Hyperlipidemia, unspecified hyperlipidemia type  E78.5 LDL cholesterol, direct    CBC with Differential/Platelet    Comprehensive metabolic panel  3. Anxiety state  F41.1   4. Right hemiplegia ([place])  G81.91   5. History of CVA (cerebrovascular accident)  Z86.73   6. High risk medication use  Z79.899 Vitamin B12  7. Depression, major, single episode, moderate (HCC)  F32.1   8. Senile purpura (HCC) Chronic D69.2   9. Former smoker  Z87.891 POCT Urinalysis Dipstick (Automated)    Meds ordered this encounter  Medications  . escitalopram (LEXAPRO) 10 MG tablet  Sig: Take 1.5 tablets (15 mg total) by mouth daily.    Dispense:  135 tablet    Refill:  1   Return precautions advised.  Garret Reddish, MD

## 2019-11-15 LAB — CBC WITH DIFFERENTIAL/PLATELET
Absolute Monocytes: 515 cells/uL (ref 200–950)
Basophils Absolute: 20 cells/uL (ref 0–200)
Basophils Relative: 0.4 %
Eosinophils Absolute: 112 cells/uL (ref 15–500)
Eosinophils Relative: 2.2 %
HCT: 44.2 % (ref 35.0–45.0)
Hemoglobin: 15 g/dL (ref 11.7–15.5)
Lymphs Abs: 1530 cells/uL (ref 850–3900)
MCH: 30.5 pg (ref 27.0–33.0)
MCHC: 33.9 g/dL (ref 32.0–36.0)
MCV: 90 fL (ref 80.0–100.0)
MPV: 10.1 fL (ref 7.5–12.5)
Monocytes Relative: 10.1 %
Neutro Abs: 2922 cells/uL (ref 1500–7800)
Neutrophils Relative %: 57.3 %
Platelets: 291 10*3/uL (ref 140–400)
RBC: 4.91 10*6/uL (ref 3.80–5.10)
RDW: 13.1 % (ref 11.0–15.0)
Total Lymphocyte: 30 %
WBC: 5.1 10*3/uL (ref 3.8–10.8)

## 2019-11-15 LAB — COMPREHENSIVE METABOLIC PANEL
AG Ratio: 1.5 (calc) (ref 1.0–2.5)
ALT: 27 U/L (ref 6–29)
AST: 21 U/L (ref 10–35)
Albumin: 4.1 g/dL (ref 3.6–5.1)
Alkaline phosphatase (APISO): 128 U/L (ref 37–153)
BUN: 13 mg/dL (ref 7–25)
CO2: 27 mmol/L (ref 20–32)
Calcium: 9.7 mg/dL (ref 8.6–10.4)
Chloride: 104 mmol/L (ref 98–110)
Creat: 0.63 mg/dL (ref 0.50–0.99)
Globulin: 2.8 g/dL (calc) (ref 1.9–3.7)
Glucose, Bld: 84 mg/dL (ref 65–99)
Potassium: 3.7 mmol/L (ref 3.5–5.3)
Sodium: 142 mmol/L (ref 135–146)
Total Bilirubin: 0.5 mg/dL (ref 0.2–1.2)
Total Protein: 6.9 g/dL (ref 6.1–8.1)

## 2019-11-15 LAB — VITAMIN B12: Vitamin B-12: 605 pg/mL (ref 200–1100)

## 2019-11-15 LAB — LDL CHOLESTEROL, DIRECT: Direct LDL: 75 mg/dL (ref <100)

## 2019-11-18 ENCOUNTER — Other Ambulatory Visit: Payer: Self-pay

## 2019-11-18 MED ORDER — ROSUVASTATIN CALCIUM 40 MG PO TABS
40.0000 mg | ORAL_TABLET | Freq: Every day | ORAL | 3 refills | Status: DC
Start: 2019-11-18 — End: 2020-11-23

## 2019-11-28 ENCOUNTER — Other Ambulatory Visit: Payer: Self-pay | Admitting: Family Medicine

## 2019-12-04 ENCOUNTER — Other Ambulatory Visit: Payer: Self-pay | Admitting: Family Medicine

## 2019-12-18 ENCOUNTER — Telehealth: Payer: Self-pay

## 2019-12-18 ENCOUNTER — Encounter: Payer: Self-pay | Admitting: Family Medicine

## 2019-12-18 ENCOUNTER — Ambulatory Visit (INDEPENDENT_AMBULATORY_CARE_PROVIDER_SITE_OTHER): Payer: Medicare Other | Admitting: Family Medicine

## 2019-12-18 VITALS — Wt 146.0 lb

## 2019-12-18 DIAGNOSIS — R112 Nausea with vomiting, unspecified: Secondary | ICD-10-CM | POA: Diagnosis not present

## 2019-12-18 DIAGNOSIS — R1011 Right upper quadrant pain: Secondary | ICD-10-CM | POA: Diagnosis not present

## 2019-12-18 NOTE — Patient Instructions (Signed)
-  bland diet for next few days  -drink plenty of water  -follow up with your doctor later this week for inperson evaluation - go to the urgent care in the interim if any further symptoms

## 2019-12-18 NOTE — Telephone Encounter (Signed)
See below

## 2019-12-18 NOTE — Progress Notes (Signed)
Scheduled appt with Dr. Yong Channel on 8/09

## 2019-12-18 NOTE — Telephone Encounter (Signed)
As long as she is not currently having symptoms I am okay with waiting until there is a same-day appointment in the afternoon-if she has worsening issues or recurrent issues should call back

## 2019-12-18 NOTE — Progress Notes (Signed)
Virtual Visit via Video Note  I connected with Brianna Simon  on 12/18/19 at 11:00 AM EDT by a video enabled telemedicine application and verified that I am speaking with the correct person using two identifiers.  Location patient: home, [place] Location provider:work or home office Persons participating in the virtual visit: patient, provider, husband  I discussed the limitations of evaluation and management by telemedicine and the availability of in person appointments. The patient expressed understanding and agreed to proceed.   HPI:  Acute visit for some abdominal pain: -started last night in the middle of the night -symptoms included: brief episode of severe pain, chills, pain shooting through RUQ to her back, nausea, vomiting x 1 then symptoms completely resolved -normal BM yesterday -no symptoms today -denies fever, melena, hematochezia, further emesis or pain, malaise, fatigue, dysuria, hematuria, SOB -chronic sinus issues - similar to baseline -she has a history of acid reflux, takes protonix -for 3-4 months has had milder similar RUQ symptoms intermittently -no known sick contacts, no new foods or travel, on abx for a dental infection (amoxicillin) -she feels fine today  ROS: See pertinent positives and negatives per HPI.  Past Medical History:  Diagnosis Date  . Anxiety   . Arthritis    hips, hands, lower back - otc med prn  . Cancer ([place])    skin cancer left wrist and left chest  . GERD (gastroesophageal reflux disease)   . Hx: UTI (urinary tract infection)   . Hyperlipidemia   . Hypertension   . Mitral valve prolapse    does not cause patient any problems  . Seasonal allergies   . Stroke [facility]) 2011   mild  . SVD (spontaneous vaginal delivery)    x 1 - gave up for adoption    Past Surgical History:  Procedure Laterality Date  . COLONOSCOPY    . DILATATION & CURETTAGE/HYSTEROSCOPY WITH TRUECLEAR N/A 10/24/2013   Procedure: DILATATION & CURETTAGE/HYSTEROSCOPY WITH  TRUCLEAR;  Surgeon: Logan Bores, MD;  Location: [place] [facility];  Service: Gynecology;  Laterality: N/A;  1 1/2hrs OR time  . EP IMPLANTABLE DEVICE N/A 03/02/2015   Procedure: Loop Recorder Insertion;  Surgeon: Will Meredith Leeds, MD;  Location: [place] [facility];  Service: Cardiovascular;  Laterality: N/A;  . lipoma removed     right shoulder  . SINUS SURGERY WITH INSTATRAK    . WISDOM TOOTH EXTRACTION      Family History  Problem Relation Age of Onset  . Arrhythmia Mother 46       pacemaker   . Stroke Mother   . Stroke Paternal Grandmother   . Diverticulitis Other   . Breast cancer Maternal Aunt   . Breast cancer Cousin     SOCIAL HX: see hpi   Current Outpatient Medications:  .  azelastine (ASTELIN) 0.1 % nasal spray, INSTILL 1 SPRAY IN EACH NOSTRIL TWICE A DAY, Disp: , Rfl: 5 .  cholecalciferol (VITAMIN D) 1000 units tablet, Take 2,000 Units by mouth daily. , Disp: , Rfl:  .  clopidogrel (PLAVIX) 75 MG tablet, TAKE 1 TABLET BY MOUTH DAILY, Disp: 90 tablet, Rfl: 3 .  escitalopram (LEXAPRO) 10 MG tablet, TAKE 1 TABLET BY MOUTH EVERY DAY, Disp: 90 tablet, Rfl: 1 .  fluticasone (FLONASE) 50 MCG/ACT nasal spray, Place 1 spray into both nostrils daily., Disp: , Rfl:  .  hydrochlorothiazide (HYDRODIURIL) 25 MG tablet, TAKE 1/2 TABLET BY MOUTH EVERY DAY, Disp: 45 tablet, Rfl: 1 .  ipratropium (ATROVENT) 0.06 % nasal  spray, INSTILL 2 SPRAYS IN EACH NOSTRIL 3 TIMES DAILY AS NEEDED, Disp: , Rfl: 5 .  levocetirizine (XYZAL) 5 MG tablet, Take 5 mg by mouth every morning., Disp: , Rfl:  .  Melatonin 1 MG TABS, Take 5 mg by mouth daily., Disp: , Rfl:  .  montelukast (SINGULAIR) 10 MG tablet, Take 1 tablet by mouth at bedtime., Disp: , Rfl: 5 .  Multiple Vitamin (MULTIVITAMIN) tablet, Take 1 tablet by mouth daily., Disp: , Rfl:  .  pantoprazole (PROTONIX) 20 MG tablet, TAKE 1 TABLET BY MOUTH EVERY DAY, Disp: 90 tablet, Rfl: 1 .  rosuvastatin (CRESTOR) 40 MG tablet, Take 1 tablet (40 mg total)  by mouth daily., Disp: 90 tablet, Rfl: 3  EXAM:  VITALS per patient if applicable:denies fevers  GENERAL: alert, oriented, appears well and in no acute distress  HEENT: atraumatic, conjunttiva clear, no obvious abnormalities on inspection of external nose and ears  NECK: normal movements of the head and neck  LUNGS: on inspection no signs of respiratory distress, breathing rate appears normal, no obvious gross SOB, gasping or wheezing  CV: no obvious cyanosis  ABD: points to epigastric and RUQ area as area where she had the symptoms, denies symptoms or TTP currently  MS: moves all visible extremities without noticeable abnormality  PSYCH/NEURO: pleasant and cooperative, no obvious depression or anxiety, speech and thought processing grossly intact  ASSESSMENT AND PLAN:  Discussed the following assessment and plan:  RUQ abdominal pain  Nausea and vomiting, intractability of vomiting not specified, unspecified vomiting type  -we discussed possible serious and likely etiologies, options for evaluation and workup, limitations of telemedicine visit vs in person visit, treatment, treatment risks and precautions. Pt prefers to treat via telemedicine empirically rather then risking or undertaking an in person visit at this moment. Query gastritis, GB disease vs other. Discussed options for ordering labs/US vs inperson eval with PCP vs UCC today. She currently feels fine and has no symptoms and has opted for continuation of PPI, bland diet and inperson eval with PCP. I did advise since symptoms intermittently for several months, PCP very well may decide imaging is needed and she is understanding. Sent message to PCP office to schedule. Patient agrees to seek prompt in person care in the interim at [facility] or ER if worsening, new symptoms arise, or if is not improving with treatment.   I discussed the assessment and treatment plan with the patient. The patient was provided an opportunity to ask  questions and all were answered. The patient agreed with the plan and demonstrated an understanding of the instructions.   The patient was advised to call back or seek an in-person evaluation if the symptoms worsen or if the condition fails to improve as anticipated.   Lucretia Kern, DO

## 2019-12-18 NOTE — Telephone Encounter (Signed)
Pt.'s Husband called about possibly moving his wife's appt to a time that's later in the day, due to her past strokes. It is now extremely difficult for her to wake up early. Pt saw Dr. Maudie Mercury today, for what husband states that Dr. Maudie Mercury to believe the passing of a gallstone. Dr. Maudie Mercury advised she'd be seen in office. Pt and husband just want to make sure its okay to wait to later next week. She is currently scheduled for Monday, and I am trying to move her to Thursday at 2:20 ( which I will also need approval for because it is a sameday appt). In summary, Please advise if pt is okay to wait til next Thursday to be seen.

## 2019-12-19 ENCOUNTER — Telehealth: Payer: Self-pay | Admitting: Family Medicine

## 2019-12-19 MED ORDER — TRAMADOL HCL 50 MG PO TABS: 50.0000 mg | ORAL_TABLET | Freq: Four times a day (QID) | ORAL | 0 refills | Status: DC | PRN

## 2019-12-19 NOTE — Telephone Encounter (Signed)
I sent in tramadol-she has a allergy to codeine but none listed her tramadol so I think this is worth a trial.  Slight risk of serotonin syndrome with this and Lexapro but I think risk is overall low.  Also slight increase GI bleeding risk-if any blood in the stool she should stop her medication and seek care.   Look forward to seeing her next week to follow up on GI concerns but I hope this helps with dental pain over weekend

## 2019-12-19 NOTE — Telephone Encounter (Signed)
Pt scheduled  

## 2019-12-19 NOTE — Telephone Encounter (Signed)
See below

## 2019-12-19 NOTE — Telephone Encounter (Signed)
Patients husband called with concern for patient she went to dentist on Tuesday and has another appointment on Thursday but she's in severe pain. She has not slept in 2 days. Patients husband requested some pain medication for her to help her cope with the pain. [place], [place] is unable to prescribe any kind of medication for her. Husband is seeing if we can get her something to help. He also said to be aware of her allergies if we prescribe something. Thank you!

## 2019-12-19 NOTE — Telephone Encounter (Signed)
Called patient reviewed all information and had repeat back to me. Will call if any questions.

## 2019-12-22 ENCOUNTER — Ambulatory Visit: Payer: Medicare Other | Admitting: Family Medicine

## 2019-12-22 ENCOUNTER — Telehealth: Payer: Self-pay | Admitting: Family Medicine

## 2019-12-22 NOTE — Telephone Encounter (Signed)
Please advise if stronger pain med can be sent in

## 2019-12-22 NOTE — Progress Notes (Signed)
Phone [phone] In person visit   Subjective:   Brianna Simon is a 69 y.o. year old very pleasant female patient who presents for/with See problem oriented charting Chief Complaint  Patient presents with  . Follow-up    Gallbladder    This visit occurred during the SARS-CoV-2 public health emergency.  Safety protocols were in place, including screening questions prior to the visit, additional usage of staff PPE, and extensive cleaning of exam room while observing appropriate contact time as indicated for disinfecting solutions.   Past Medical History-  Patient Active Problem List   Diagnosis Date Noted  . Right hemiplegia ([place]) 05/06/2019    Priority: High  . History of CVA (cerebrovascular accident) 02/22/2010    Priority: High  . Osteopenia 10/02/2017    Priority: Medium  . Former smoker 12/09/2014    Priority: Medium  . Anxiety state 10/27/2014    Priority: Medium  . Hyperlipidemia 02/22/2010    Priority: Medium  . Essential hypertension 12/07/2006    Priority: Medium  . Senile purpura ([place]) 11/05/2018    Priority: Low  . Allergic rhinitis 10/27/2014    Priority: Low  . Esophageal reflux 12/31/2006    Priority: Low  . Insomnia 12/31/2006    Priority: Low  . MVP (mitral valve prolapse) 12/10/2006    Priority: Low  . Depression, major, single episode, moderate ([place]) 11/14/2019  . Alkaline phosphatase elevation 06/16/2019  . Hemiplegia affecting dominant side, 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[place]) 03/01/2018  . Benign paroxysmal positional vertigo 11/13/2016  . Low back pain 09/26/2016  . Dysarthria 03/02/2015    Medications- reviewed and updated Current Outpatient Medications  Medication Sig Dispense Refill  . azelastine (ASTELIN) 0.1 % nasal spray INSTILL 1 SPRAY IN EACH NOSTRIL TWICE A DAY  5  . cholecalciferol (VITAMIN D) 1000 units tablet Take 2,000 Units by mouth daily.     . clopidogrel (PLAVIX) 75 MG tablet TAKE 1 TABLET BY MOUTH DAILY 90 tablet 3  .  escitalopram (LEXAPRO) 10 MG tablet TAKE 1 TABLET BY MOUTH EVERY DAY 90 tablet 1  . fluticasone (FLONASE) 50 MCG/ACT nasal spray Place 1 spray into both nostrils daily.    . hydrochlorothiazide (HYDRODIURIL) 25 MG tablet TAKE 1/2 TABLET BY MOUTH EVERY DAY 45 tablet 1  . ipratropium (ATROVENT) 0.06 % nasal spray INSTILL 2 SPRAYS IN EACH NOSTRIL 3 TIMES DAILY AS NEEDED  5  . levocetirizine (XYZAL) 5 MG tablet Take 5 mg by mouth every morning.    . Melatonin 1 MG TABS Take 5 mg by mouth daily.    . montelukast (SINGULAIR) 10 MG tablet Take 1 tablet by mouth at bedtime.  5  . Multiple Vitamin (MULTIVITAMIN) tablet Take 1 tablet by mouth daily.    . pantoprazole (PROTONIX) 20 MG tablet TAKE 1 TABLET BY MOUTH EVERY DAY 90 tablet 1  . rosuvastatin (CRESTOR) 40 MG tablet Take 1 tablet (40 mg total) by mouth daily. 90 tablet 3   No current facility-administered medications for this visit.     Objective:  BP 118/64   Pulse 64   Temp 98.7 F (37.1 C) (Temporal)   Ht 5\' 2"  (1.575 m)   Wt 144 lb (65.3 kg)   LMP  (LMP Unknown)   SpO2 97%   BMI 26.34 kg/m  Gen: NAD, resting comfortably CV: RRR no murmurs rubs or gallops Lungs: CTAB no crackles, wheeze, rhonchi Abdomen: soft/nontender/nondistended/normal bowel sounds. No rebound or guarding.  Ext: no edema Skin: warm, dry  Assessment and Plan  #Right upper quadrant pain S: went to dentist Wednesday the 4th and had both dental pain and RUQ pain that evening. Dental pain eventually led to emergent root canal on Friday. Only had antibiotics for 2 days prior to this and so was really tough. We sent in tramadol to help and initially did not help but thankfully we followed up earlier this week and it had.   Patient has not had issues after Wednesday with RUQ abdominal pain up to 8-9/10- she threw up with this x2. Intermittent twinges from time to time in RUQ- on and off for several months- maybe 7 episodes.   Patient saw Dr. Maudie Mercury in 1 December 18, 2019 virtually-concern gastritis, gallbladder disease or other.  Plan was for inpatient evaluation for labs and potential ultrasound.  She was recommended to continue PPI, use bland diet until evaluation.  Has cut down some on fried and fatty foods.  A/P: 69 year old female with right upper quadrant pain of unclear etiology.  Possible intermittent passing of gallstones.  We will update a right upper quadrant ultrasound.  I will also check CBC, CMP, lipase to look for any other abnormalities.  If she has new or worsening symptoms she will let us know.  If she does not fact have gallstones could consider consult with general surgery for their opinion on removal-particularly with last episode of pain being so severe.  We also discussed could be constipation or gaseous distention related.  She will keep [place] updated on her symptoms.  If drastic worsening will seek care immediately in emergency room  #hyperlipidemia S: Medication:atorvastatin 40mg   Lab Results  Component Value Date   CHOL 114 05/06/2019   HDL 38.30 (L) 05/06/2019   LDLCALC 53 05/06/2019   LDLDIRECT 75 11/14/2019   TRIG 111.0 05/06/2019   CHOLHDL 3 05/06/2019   A/P: recently changed to rosuvastatin 40mg  to get LDL under 70- she is going to finish atorvastatin before changing  Recommended follow up: Keep next regularly scheduled visit-see [place] sooner if you have new or worsening symptoms.  If there are gallstones-we may consider general surgery consult Future Appointments  Date Time Provider [place]  01/22/2020  2:50 PM [facility] 2 [facility]  05/17/2020  2:40 PM Kaevon Cotta, Brayton Mars, MD [facility]    Lab/Order associations: No diagnosis found.  No orders of the defined types were placed in this encounter.  Return precautions advised.  Garret Reddish, MD

## 2019-12-22 NOTE — Telephone Encounter (Signed)
Had emergency root canal- tramadol started helping around the 3rd pill (didn't help at all first two pills). Dentist is back and town and can help if needed. Another place in [place] did the root canal.

## 2019-12-22 NOTE — Telephone Encounter (Signed)
Nurse Assessment Nurse: Hassell Done, RN, Melanie Date/Time (Eastern Time): 12/19/2019 6:20:45 PM Confirm and document reason for call. If symptomatic, describe symptoms. ---Caller states wife had a root canal and was told to take tylenol and was given tramadol and has taken several and they are not helping. Caller is her H and he wonders when he can get a stronger pain reliever. States he already knows what she needs is to get a stronger pain pill. Has the patient had close contact with a person known or suspected to have the novel coronavirus illness OR traveled / lives in area with major community spread (including international travel) in the last 14 days from the onset of symptoms? * If Asymptomatic, screen for exposure and travel within the last 14 days. ---No Does the patient have any new or worsening symptoms? ---Yes Will a triage be completed? ---No Select reason for no triage. ---Patient declined Disp. Time Eilene Ghazi Time) Disposition Final User 12/19/2019 6:24:57 PM Clinical Call Yes Hassell Done, RN, [facility]

## 2019-12-25 ENCOUNTER — Encounter: Payer: Self-pay | Admitting: Family Medicine

## 2019-12-25 ENCOUNTER — Ambulatory Visit (INDEPENDENT_AMBULATORY_CARE_PROVIDER_SITE_OTHER): Payer: Medicare Other | Admitting: Family Medicine

## 2019-12-25 ENCOUNTER — Other Ambulatory Visit: Payer: Self-pay

## 2019-12-25 VITALS — BP 118/64 | HR 64 | Temp 98.7°F | Ht 62.0 in | Wt 144.0 lb

## 2019-12-25 DIAGNOSIS — R1011 Right upper quadrant pain: Secondary | ICD-10-CM | POA: Diagnosis not present

## 2019-12-25 DIAGNOSIS — E785 Hyperlipidemia, unspecified: Secondary | ICD-10-CM | POA: Diagnosis not present

## 2019-12-25 NOTE — Patient Instructions (Addendum)
Health Maintenance Due  Topic Date Due  . INFLUENZA VACCINE will get later in year  12/14/2019   Please stop by lab before you go If you have mychart- we will send your results within 3 business days of [place] receiving them.  If you do not have mychart- we will call you about results within 5 business days of [place] receiving them.  *please note we are currently using [facility] which has a longer processing time than [facility] typically so labs may not come back as quickly as in the past *please also note that you will see labs on mychart as soon as they post. I will later go in and write notes on them- will say "notes from Dr. Yong Channel"  We will call you within two weeks about your referral to RUQ ultrasound. If you do not hear within 3 weeks, give [place] a call.    Recommended follow up: Keep next regularly scheduled visit-see [place] sooner if you have new or worsening symptoms.  If there are gallstones-we may consider general surgery consult

## 2019-12-26 LAB — COMPLETE METABOLIC PANEL WITH GFR
AG Ratio: 1.7 (calc) (ref 1.0–2.5)
ALT: 35 U/L — ABNORMAL HIGH (ref 6–29)
AST: 25 U/L (ref 10–35)
Albumin: 4 g/dL (ref 3.6–5.1)
Alkaline phosphatase (APISO): 115 U/L (ref 37–153)
BUN: 15 mg/dL (ref 7–25)
CO2: 32 mmol/L (ref 20–32)
Calcium: 9.6 mg/dL (ref 8.6–10.4)
Chloride: 105 mmol/L (ref 98–110)
Creat: 0.67 mg/dL (ref 0.50–0.99)
GFR, Est African American: 105 mL/min/{1.73_m2} (ref >=60)
GFR, Est Non African American: 91 mL/min/{1.73_m2} (ref >=60)
Globulin: 2.3 g/dL (calc) (ref 1.9–3.7)
Glucose, Bld: 85 mg/dL (ref 65–99)
Potassium: 4.6 mmol/L (ref 3.5–5.3)
Sodium: 141 mmol/L (ref 135–146)
Total Bilirubin: 0.3 mg/dL (ref 0.2–1.2)
Total Protein: 6.3 g/dL (ref 6.1–8.1)

## 2019-12-26 LAB — CBC WITH DIFFERENTIAL/PLATELET
Absolute Monocytes: 552 cells/uL (ref 200–950)
Basophils Absolute: 19 cells/uL (ref 0–200)
Basophils Relative: 0.4 %
Eosinophils Absolute: 149 cells/uL (ref 15–500)
Eosinophils Relative: 3.1 %
HCT: 43.5 % (ref 35.0–45.0)
Hemoglobin: 14.4 g/dL (ref 11.7–15.5)
Lymphs Abs: 1325 cells/uL (ref 850–3900)
MCH: 30.7 pg (ref 27.0–33.0)
MCHC: 33.1 g/dL (ref 32.0–36.0)
MCV: 92.8 fL (ref 80.0–100.0)
MPV: 10.1 fL (ref 7.5–12.5)
Monocytes Relative: 11.5 %
Neutro Abs: 2755 cells/uL (ref 1500–7800)
Neutrophils Relative %: 57.4 %
Platelets: 284 10*3/uL (ref 140–400)
RBC: 4.69 10*6/uL (ref 3.80–5.10)
RDW: 12.7 % (ref 11.0–15.0)
Total Lymphocyte: 27.6 %
WBC: 4.8 10*3/uL (ref 3.8–10.8)

## 2019-12-26 LAB — LIPASE: Lipase: 32 U/L (ref 7–60)

## 2019-12-29 ENCOUNTER — Telehealth: Payer: Self-pay

## 2019-12-29 NOTE — Telephone Encounter (Signed)
Pt called back told her following up on message from days ago about pain medication. Pt said she was taken care of saw Dr. Yong Channel. Told her okay, pt does not need anything further at this time.

## 2019-12-29 NOTE — Telephone Encounter (Signed)
Patient is returning call.  °

## 2019-12-29 NOTE — Telephone Encounter (Signed)
Left message on voicemail to call office.  

## 2020-01-02 ENCOUNTER — Ambulatory Visit
Admission: RE | Admit: 2020-01-02 | Discharge: 2020-01-02 | Disposition: A | Discharge status: Home / Self Care | Payer: Medicare Other | Source: Ambulatory Visit | Attending: Family Medicine | Admitting: Family Medicine

## 2020-01-02 DIAGNOSIS — K7689 Other specified diseases of liver: Secondary | ICD-10-CM | POA: Diagnosis not present

## 2020-01-02 DIAGNOSIS — R1011 Right upper quadrant pain: Secondary | ICD-10-CM

## 2020-01-10 ENCOUNTER — Other Ambulatory Visit: Payer: Self-pay | Admitting: Family Medicine

## 2020-01-10 DIAGNOSIS — I639 Cerebral infarction, unspecified: Secondary | ICD-10-CM

## 2020-01-22 ENCOUNTER — Ambulatory Visit: Payer: Medicare Other

## 2020-01-22 DIAGNOSIS — Z01419 Encounter for gynecological examination (general) (routine) without abnormal findings: Secondary | ICD-10-CM | POA: Diagnosis not present

## 2020-01-29 ENCOUNTER — Other Ambulatory Visit: Payer: Self-pay

## 2020-01-29 ENCOUNTER — Ambulatory Visit
Admission: RE | Admit: 2020-01-29 | Discharge: 2020-01-29 | Disposition: A | Discharge status: Home / Self Care | Payer: Medicare Other | Source: Ambulatory Visit | Attending: Obstetrics and Gynecology | Admitting: Obstetrics and Gynecology

## 2020-01-29 DIAGNOSIS — Z1231 Encounter for screening mammogram for malignant neoplasm of breast: Secondary | ICD-10-CM

## 2020-02-03 ENCOUNTER — Other Ambulatory Visit: Payer: Self-pay | Admitting: Obstetrics and Gynecology

## 2020-02-03 DIAGNOSIS — R928 Other abnormal and inconclusive findings on diagnostic imaging of breast: Secondary | ICD-10-CM

## 2020-02-09 ENCOUNTER — Ambulatory Visit: Payer: Medicare Other

## 2020-02-09 ENCOUNTER — Other Ambulatory Visit: Payer: Self-pay

## 2020-02-09 ENCOUNTER — Ambulatory Visit
Admission: RE | Admit: 2020-02-09 | Discharge: 2020-02-09 | Disposition: A | Discharge status: Home / Self Care | Payer: Medicare Other | Source: Ambulatory Visit | Attending: Obstetrics and Gynecology | Admitting: Obstetrics and Gynecology

## 2020-02-09 DIAGNOSIS — R922 Inconclusive mammogram: Secondary | ICD-10-CM | POA: Diagnosis not present

## 2020-02-09 DIAGNOSIS — R928 Other abnormal and inconclusive findings on diagnostic imaging of breast: Secondary | ICD-10-CM

## 2020-02-11 DIAGNOSIS — J300 Vasomotor rhinitis: Secondary | ICD-10-CM | POA: Diagnosis not present

## 2020-02-11 DIAGNOSIS — J301 Allergic rhinitis due to pollen: Secondary | ICD-10-CM | POA: Diagnosis not present

## 2020-02-11 DIAGNOSIS — J3089 Other allergic rhinitis: Secondary | ICD-10-CM | POA: Diagnosis not present

## 2020-02-24 ENCOUNTER — Ambulatory Visit: Payer: Medicare Other

## 2020-03-01 ENCOUNTER — Other Ambulatory Visit (HOSPITAL_BASED_OUTPATIENT_CLINIC_OR_DEPARTMENT_OTHER): Payer: Self-pay | Admitting: Internal Medicine

## 2020-03-01 ENCOUNTER — Ambulatory Visit: Payer: Medicare Other | Attending: Internal Medicine

## 2020-03-01 DIAGNOSIS — Z23 Encounter for immunization: Secondary | ICD-10-CM

## 2020-03-01 NOTE — Progress Notes (Signed)
   Covid-19 Vaccination Clinic  Name:  Riyanshi Wahab    MRN: [id number] DOB: 1950-07-07  03/01/2020  Ms. Scheier was observed post Covid-19 immunization for 15 minutes without incident. She was provided with Vaccine Information Sheet and instruction to access the V-Safe system.   Ms. Townsel was instructed to call 911 with any severe reactions post vaccine: [name] Difficulty breathing  . Swelling of face and throat  . A fast heartbeat  . A bad rash all over body  . Dizziness and weakness

## 2020-03-03 ENCOUNTER — Ambulatory Visit: Payer: Medicare Other

## 2020-03-09 MED FILL — PFIZER-BIONTECH COVID-19 VA: 30 | 1 days supply | Qty: 0 | Fill #0

## 2020-03-18 ENCOUNTER — Other Ambulatory Visit: Payer: Self-pay

## 2020-03-18 ENCOUNTER — Ambulatory Visit (INDEPENDENT_AMBULATORY_CARE_PROVIDER_SITE_OTHER): Payer: Medicare Other

## 2020-03-18 DIAGNOSIS — Z23 Encounter for immunization: Secondary | ICD-10-CM

## 2020-04-12 DIAGNOSIS — H25813 Combined forms of age-related cataract, bilateral: Secondary | ICD-10-CM | POA: Diagnosis not present

## 2020-04-15 ENCOUNTER — Other Ambulatory Visit: Payer: Self-pay

## 2020-04-15 ENCOUNTER — Encounter: Payer: Self-pay | Admitting: Podiatry

## 2020-04-15 ENCOUNTER — Ambulatory Visit (INDEPENDENT_AMBULATORY_CARE_PROVIDER_SITE_OTHER): Payer: Medicare Other | Admitting: Podiatry

## 2020-04-15 ENCOUNTER — Other Ambulatory Visit: Payer: Self-pay | Admitting: *Deleted

## 2020-04-15 DIAGNOSIS — M79676 Pain in unspecified toe(s): Secondary | ICD-10-CM | POA: Diagnosis not present

## 2020-04-15 DIAGNOSIS — L600 Ingrowing nail: Secondary | ICD-10-CM

## 2020-04-15 NOTE — Progress Notes (Signed)
  Subjective:  Patient ID: Brianna Simon, female    DOB: 11-Apr-1951,  MRN: [id number]  Chief Complaint  Patient presents with  . Nail Problem    the right big toenail has an ingrown and is sore and tender    69 y.o. female presents with the above complaint. History confirmed with patient.   Objective:  Physical Exam: warm, good capillary refill, no trophic changes or ulcerative lesions, normal DP and PT pulses and normal sensory exam.  Painful ingrowing nail at lateral border of the right, hallux; without warmth, erythema or drainage  Assessment:   1. Ingrown nail   2. Pain around toenail      Plan:  Patient was evaluated and treated and all questions answered.  Ingrown Nail, right -Nail gently debrided in slant back fashion to patient relief. -F/u PRN

## 2020-05-11 DIAGNOSIS — G4721 Circadian rhythm sleep disorder, delayed sleep phase type: Secondary | ICD-10-CM | POA: Diagnosis not present

## 2020-05-15 NOTE — Patient Instructions (Incomplete)
Depression screen [facility] 2/9 11/14/2019 05/06/2019 04/28/2019  Decreased Interest 1 0 1  Down, Depressed, Hopeless 1 0 1  PHQ - 2 Score 2 0 2  Altered sleeping 3 3 1   Tired, decreased energy 1 3 1   Change in appetite 1 0 0  Feeling bad or failure about yourself  0 0 0  Trouble concentrating 1 2 0  Moving slowly or fidgety/restless 2 0 0  Suicidal thoughts 0 0 0  PHQ-9 Score 10 8 4   Difficult doing work/chores Somewhat difficult Somewhat difficult -

## 2020-05-15 NOTE — Progress Notes (Deleted)
Phone [phone] In person visit   Subjective:   Brianna Simon is a 70 y.o. year old very pleasant female patient who presents for/with See problem oriented charting No chief complaint on file.   This visit occurred during the SARS-CoV-2 public health emergency.  Safety protocols were in place, including screening questions prior to the visit, additional usage of staff PPE, and extensive cleaning of exam room while observing appropriate contact time as indicated for disinfecting solutions.   Past Medical History-  Patient Active Problem List   Diagnosis Date Noted  . Depression, major, single episode, moderate ([place]) 11/14/2019  . Alkaline phosphatase elevation 06/16/2019  . Right hemiplegia ([place]) 05/06/2019  . Senile purpura ([place]) 11/05/2018  . Hemiplegia affecting dominant side, 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[place]) 03/01/2018  . Osteopenia 10/02/2017  . Benign paroxysmal positional vertigo 11/13/2016  . Low back pain 09/26/2016  . Dysarthria 03/02/2015  . Former smoker 12/09/2014  . Allergic rhinitis 10/27/2014  . Anxiety state 10/27/2014  . Hyperlipidemia 02/22/2010  . History of CVA (cerebrovascular accident) 02/22/2010  . Esophageal reflux 12/31/2006  . Insomnia 12/31/2006  . MVP (mitral valve prolapse) 12/10/2006  . Essential hypertension 12/07/2006    Medications- reviewed and updated Current Outpatient Medications  Medication Sig Dispense Refill  . azelastine (ASTELIN) 0.1 % nasal spray INSTILL 1 SPRAY IN EACH NOSTRIL TWICE A DAY  5  . cholecalciferol (VITAMIN D) 1000 units tablet Take 2,000 Units by mouth daily.     . clopidogrel (PLAVIX) 75 MG tablet TAKE 1 TABLET BY MOUTH EVERY DAY 90 tablet 3  . escitalopram (LEXAPRO) 10 MG tablet TAKE 1 TABLET BY MOUTH EVERY DAY 90 tablet 1  . famotidine (PEPCID) 20 MG tablet famotidine 20 mg tablet  TAKE 1 TABLET BY MOUTH TWICE A DAY    . hydrochlorothiazide (HYDRODIURIL) 25 MG tablet TAKE 1/2 TABLET BY MOUTH EVERY DAY 45 tablet 1   . ipratropium (ATROVENT) 0.06 % nasal spray INSTILL 2 SPRAYS IN EACH NOSTRIL 3 TIMES DAILY AS NEEDED  5  . levocetirizine (XYZAL) 5 MG tablet Take 5 mg by mouth every morning.    . Melatonin 1 MG TABS Take 5 mg by mouth daily.    . Multiple Vitamin (MULTIVITAMIN) tablet Take 1 tablet by mouth daily.    . pantoprazole (PROTONIX) 20 MG tablet TAKE 1 TABLET BY MOUTH EVERY DAY 90 tablet 1  . rosuvastatin (CRESTOR) 40 MG tablet Take 1 tablet (40 mg total) by mouth daily. 90 tablet 3  . traMADol (ULTRAM) 50 MG tablet tramadol 50 mg tablet  TAKE 1 TABLET (50 MG TOTAL) BY MOUTH EVERY 6 (SIX) HOURS AS NEEDED FOR MODERATE PAIN OR SEVERE PAIN.     No current facility-administered medications for this visit.     Objective:  LMP  (LMP Unknown)  Gen: NAD, resting comfortably CV: RRR no murmurs rubs or gallops Lungs: CTAB no crackles, wheeze, rhonchi Abdomen: soft/nontender/nondistended/normal bowel sounds. No rebound or guarding.  Ext: no edema Skin: warm, dry Neuro: grossly normal, moves all extremities  ***    Assessment and Plan   # GERD S:Medication: Protonix 20Mg   B12 levels related to PPI use: Lab Results  Component Value Date   VITAMINB12 605 11/14/2019   A/P: ***   #hyperlipidemia S: Medication: Rosuvastatin 40Mg  Lab Results  Component Value Date   CHOL 114 05/06/2019   HDL 38.30 (L) 05/06/2019   LDLCALC 53 05/06/2019   LDLDIRECT 75 11/14/2019   TRIG 111.0 05/06/2019   CHOLHDL 3 05/06/2019  A/P: ***  # Depression S: Medication:***  Depression screen [facility] 2/9 11/14/2019 05/06/2019 04/28/2019  Decreased Interest 1 0 1  Down, Depressed, Hopeless 1 0 1  PHQ - 2 Score 2 0 2  Altered sleeping 3 3 1   Tired, decreased energy 1 3 1   Change in appetite 1 0 0  Feeling bad or failure about yourself  0 0 0  Trouble concentrating 1 2 0  Moving slowly or fidgety/restless 2 0 0  Suicidal thoughts 0 0 0  PHQ-9 Score 10 8 4   Difficult doing work/chores Somewhat difficult  Somewhat difficult -   A/P: *** # Anxiety S:Medication: Lexapro 10Mg  Counseling: *** No flowsheet data found. A/P: ***    #hypertension S: medication: HCTZ 25Mg  Home readings #s: *** BP Readings from Last 3 Encounters:  12/25/19 118/64  11/14/19 112/62  06/16/19 124/62  A/P: ***   Medicare AWVl: 04/28/2019 ***  No problem-specific Assessment & Plan notes found for this encounter.   Recommended follow up: ***No follow-ups on file. Future Appointments  Date Time Provider Department Center  05/17/2020  2:40 PM 02/24/20, MD [facility]    Lab/Order associations: No diagnosis found.  No orders of the defined types were placed in this encounter.   Time Spent: *** minutes of total time (7:08 PM***- 7:08 PM***) was spent on the date of the encounter performing the following actions: chart review prior to seeing the patient, obtaining history, performing a medically necessary exam, counseling on the treatment plan, placing orders, and documenting in our EHR.   Return precautions advised.  01/15/20, CMA

## 2020-05-17 ENCOUNTER — Ambulatory Visit: Payer: Medicare Other | Admitting: Family Medicine

## 2020-05-17 DIAGNOSIS — K219 Gastro-esophageal reflux disease without esophagitis: Secondary | ICD-10-CM

## 2020-05-17 DIAGNOSIS — F411 Generalized anxiety disorder: Secondary | ICD-10-CM

## 2020-05-17 DIAGNOSIS — E785 Hyperlipidemia, unspecified: Secondary | ICD-10-CM

## 2020-05-17 DIAGNOSIS — I10 Essential (primary) hypertension: Secondary | ICD-10-CM

## 2020-05-17 DIAGNOSIS — F321 Major depressive disorder, single episode, moderate: Secondary | ICD-10-CM

## 2020-05-23 NOTE — Patient Instructions (Addendum)
You are eligible to schedule your annual wellness visit with our nurse specialist Otila Kluver.  Please consider scheduling this before you leave today  Please call [phone] to schedule a visit with [facility]. I strongly advise this given your stressors -Trey Paula is an excellent counselor who is based out of our clinic  Recommended follow up: Return in about 6 months (around 11/21/2020) for follow up- or sooner if needed.

## 2020-05-23 NOTE — Progress Notes (Signed)
Phone [phone] In person visit   Subjective:   Brianna Simon is a 70 y.o. year old very pleasant female patient who presents for/with See problem oriented charting Chief Complaint  Patient presents with  . Hypertension  . Hyperlipidemia  . Depression  . Anxiety    This visit occurred during the SARS-CoV-2 public health emergency.  Safety protocols were in place, including screening questions prior to the visit, additional usage of staff PPE, and extensive cleaning of exam room while observing appropriate contact time as indicated for disinfecting solutions.   Past Medical History-  Patient Active Problem List   Diagnosis Date Noted  . Right hemiplegia ([place]) 05/06/2019    Priority: High  . History of CVA (cerebrovascular accident) 02/22/2010    Priority: High  . Fatty liver 05/24/2020    Priority: Medium  . Depression, major, single episode, moderate ([place]) 11/14/2019    Priority: Medium  . Hemiplegia affecting dominant side, 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[place]) 03/01/2018    Priority: Medium  . Osteopenia 10/02/2017    Priority: Medium  . Former smoker 12/09/2014    Priority: Medium  . Anxiety state 10/27/2014    Priority: Medium  . Hyperlipidemia 02/22/2010    Priority: Medium  . Essential hypertension 12/07/2006    Priority: Medium  . Senile purpura ([place]) 11/05/2018    Priority: Low  . Allergic rhinitis 10/27/2014    Priority: Low  . Esophageal reflux 12/31/2006    Priority: Low  . Insomnia 12/31/2006    Priority: Low  . MVP (mitral valve prolapse) 12/10/2006    Priority: Low  . Alkaline phosphatase elevation 06/16/2019  . Benign paroxysmal positional vertigo 11/13/2016  . Low back pain 09/26/2016  . Dysarthria 03/02/2015    Medications- reviewed and updated Current Outpatient Medications  Medication Sig Dispense Refill  . azelastine (ASTELIN) 0.1 % nasal spray INSTILL 1 SPRAY IN EACH NOSTRIL TWICE A DAY  5  . cholecalciferol (VITAMIN D) 1000 units tablet Take  2,000 Units by mouth daily.     . clopidogrel (PLAVIX) 75 MG tablet TAKE 1 TABLET BY MOUTH EVERY DAY 90 tablet 3  . escitalopram (LEXAPRO) 10 MG tablet TAKE 1 TABLET BY MOUTH EVERY DAY (Patient taking differently: 15 mg. Patient takes 1.5 tablets daily) 90 tablet 1  . hydrochlorothiazide (HYDRODIURIL) 25 MG tablet TAKE 1/2 TABLET BY MOUTH EVERY DAY 45 tablet 1  . ipratropium (ATROVENT) 0.06 % nasal spray INSTILL 2 SPRAYS IN EACH NOSTRIL 3 TIMES DAILY AS NEEDED  5  . levocetirizine (XYZAL) 5 MG tablet Take 5 mg by mouth every morning.    . Melatonin 1 MG TABS Take 5 mg by mouth daily.    . Multiple Vitamin (MULTIVITAMIN) tablet Take 1 tablet by mouth daily.    . pantoprazole (PROTONIX) 20 MG tablet TAKE 1 TABLET BY MOUTH EVERY DAY 90 tablet 1  . rosuvastatin (CRESTOR) 40 MG tablet Take 1 tablet (40 mg total) by mouth daily. 90 tablet 3  . traMADol (ULTRAM) 50 MG tablet tramadol 50 mg tablet  TAKE 1 TABLET (50 MG TOTAL) BY MOUTH EVERY 6 (SIX) HOURS AS NEEDED FOR MODERATE PAIN OR SEVERE PAIN. (Patient not taking: Reported on 05/24/2020)     No current facility-administered medications for this visit.     Objective:  BP 118/72   Pulse 62   Temp 98.4 F (36.9 C) (Temporal)   Ht 5' 2"  (1.575 m)   Wt 143 lb (64.9 kg)   LMP  (LMP Unknown)   SpO2  92%   BMI 26.16 kg/m  Gen: NAD, resting comfortably CV: RRR no murmurs rubs or gallops Lungs: CTAB no crackles, wheeze, rhonchi Abdomen: soft/nontender/nondistended/normal bowel sounds. No rebound or guarding.  Ext: no edema Skin: warm, dry Neuro: slight decrease sensation to gross touch on right hand    Assessment and Plan   #History of CVA with residual right hemiplegia (numbness mainly right hand with some trouble picking things up as result- no weakness reported))with loop recorder in place showing no evidence of atrial fibrillation #hyperlipidemia S: Medication: Rosuvastatin 40Mg increased from atorvastatin 40 mg with goal to get LDL under  70, Plavix 75 mg. No aspirin Lab Results  Component Value Date   CHOL 114 05/06/2019   HDL 38.30 (L) 05/06/2019   LDLCALC 53 05/06/2019   LDLDIRECT 75 11/14/2019   TRIG 111.0 05/06/2019   CHOLHDL 3 05/06/2019   A/P: Thankfully no recurrence of stroke.  Residual hemiplegia stable- only right hand.  Hyperlipidemia above goal last visit-hopefully at goal on rosuvastatin 40 mg-update lipid panel today  # Depression/anxiety S: Medication: Lexapro 15Mg-PHQ-9 improved from 10-6 today- slight improvement.  Also recommended counseling a lot of stress with her husband at home- she has not done counseling and doesn't think husband would be willing to go (worried about stress on marriage) Depression screen [facility] 2/9 05/24/2020 11/14/2019 05/06/2019  Decreased Interest 1 1 0  Down, Depressed, Hopeless 0 1 0  PHQ - 2 Score 1 2 0  Altered sleeping 1 3 3   Tired, decreased energy 1 1 3   Change in appetite 0 1 0  Feeling bad or failure about yourself  0 0 0  Trouble concentrating 0 1 2  Moving slowly or fidgety/restless 3 2 0  Suicidal thoughts 0 0 0  PHQ-9 Score 6 10 8   Difficult doing work/chores Not difficult at all Somewhat difficult Somewhat difficult  A/P: improved control but still not ideal- encouraged her to strongly consider counseling  #hypertension S: medication: HctZ 25Mg-takes half tablet daily Home readings #s: rarely checks- looks good when does check BP Readings from Last 3 Encounters:  05/24/20 118/72  12/25/19 118/64  11/14/19 112/62  A/P: Stable. Continue current medications.   # GERD S:Medication: Pantoprazole 20 mg daily as significant flareups if she misses doses in past- better lately, Pepcid 20 mg twice daily over-the-counter trial failed in the past  B12 levels related to PPI use: Lab Results  Component Value Date   VITAMINB12 605 11/14/2019  A/P: remains controlled- did not tolerate pepcid trial- thankfully better lately.    #Right upper quadrant pain-right upper  quadrant ultrasound last visit for potential gallstones with no gallstones. Did have some fatty liver but thankfully LFTs have been ok other htan hair high alk phos. interestingly no recurrence of pain since last visit-continue to monitor Lab Results  Component Value Date   ALT 35 (H) 12/25/2019   AST 25 12/25/2019   ALKPHOS 133 (H) 05/06/2019   BILITOT 0.3 12/25/2019  - discussed adding regular exe rcise for fatty liver  #Elevated alkaline phosphatase- work-up reassuring with Dr. Loanne Drilling in endocrinology.  Has had extensive blood work.  Plan was for 1 year repeat visit with endocrine   #Senile purpura-noticed this while on Plavix.  Update CBC with labs  Recommended follow up: Return in about 6 months (around 11/21/2020) for follow up- or sooner if needed. -Recommended scheduling annual wellness visit with Otila Kluver  Lab/Order associations: NOT fasting   ICD-10-CM   1. Essential hypertension  I10  2. Gastroesophageal reflux disease without esophagitis  K21.9   3. Anxiety state  F41.1   4. Depression, major, single episode, moderate ([facility])  F32.1   5. Hyperlipidemia, unspecified hyperlipidemia type  E78.5 CBC with Differential/Platelet    Comprehensive metabolic panel    Lipid panel  6. Right hemiplegia ([facility]) Chronic G81.91   7. Senile purpura ([facility]) Chronic D69.2   8. Fatty liver  K76.0    Meds ordered this encounter  Medications  . escitalopram (LEXAPRO) 10 MG tablet    Sig: Take 1.5 tablets (15 mg total) by mouth daily. Patient takes 1.5 tablets daily    Dispense:  135 tablet    Refill:  3  . pantoprazole (PROTONIX) 20 MG tablet    Sig: Take 1 tablet (20 mg total) by mouth daily.    Dispense:  90 tablet    Refill:  3   Return precautions advised.  Garret Reddish, MD

## 2020-05-24 ENCOUNTER — Encounter: Payer: Self-pay | Admitting: Family Medicine

## 2020-05-24 ENCOUNTER — Ambulatory Visit (INDEPENDENT_AMBULATORY_CARE_PROVIDER_SITE_OTHER): Payer: Medicare Other | Admitting: Family Medicine

## 2020-05-24 ENCOUNTER — Other Ambulatory Visit: Payer: Self-pay

## 2020-05-24 ENCOUNTER — Other Ambulatory Visit: Payer: Self-pay | Admitting: Family Medicine

## 2020-05-24 VITALS — BP 118/72 | HR 62 | Temp 98.4°F | Ht 62.0 in | Wt 143.0 lb

## 2020-05-24 DIAGNOSIS — F321 Major depressive disorder, single episode, moderate: Secondary | ICD-10-CM | POA: Diagnosis not present

## 2020-05-24 DIAGNOSIS — K760 Fatty (change of) liver, not elsewhere classified: Secondary | ICD-10-CM

## 2020-05-24 DIAGNOSIS — F411 Generalized anxiety disorder: Secondary | ICD-10-CM

## 2020-05-24 DIAGNOSIS — D692 Other nonthrombocytopenic purpura: Secondary | ICD-10-CM | POA: Diagnosis not present

## 2020-05-24 DIAGNOSIS — I10 Essential (primary) hypertension: Secondary | ICD-10-CM | POA: Diagnosis not present

## 2020-05-24 DIAGNOSIS — K219 Gastro-esophageal reflux disease without esophagitis: Secondary | ICD-10-CM

## 2020-05-24 DIAGNOSIS — G8191 Hemiplegia, unspecified affecting right dominant side: Secondary | ICD-10-CM

## 2020-05-24 DIAGNOSIS — E785 Hyperlipidemia, unspecified: Secondary | ICD-10-CM

## 2020-05-24 MED ORDER — PANTOPRAZOLE SODIUM 20 MG PO TBEC
20.0000 mg | DELAYED_RELEASE_TABLET | Freq: Every day | ORAL | 3 refills | Status: DC
Start: 2020-05-24 — End: 2020-11-23

## 2020-05-24 MED ORDER — ESCITALOPRAM OXALATE 10 MG PO TABS
15.0000 mg | ORAL_TABLET | Freq: Every day | ORAL | 3 refills | Status: DC
Start: 2020-05-24 — End: 2020-11-23

## 2020-05-25 LAB — CBC WITH DIFFERENTIAL/PLATELET
Basophils Absolute: 0 10*3/uL (ref 0.0–0.1)
Basophils Relative: 0.9 % (ref 0.0–3.0)
Eosinophils Absolute: 0.1 10*3/uL (ref 0.0–0.7)
Eosinophils Relative: 2.6 % (ref 0.0–5.0)
HCT: 42.4 % (ref 36.0–46.0)
Hemoglobin: 14.2 g/dL (ref 12.0–15.0)
Lymphocytes Relative: 25.7 % (ref 12.0–46.0)
Lymphs Abs: 1.4 10*3/uL (ref 0.7–4.0)
MCHC: 33.5 g/dL (ref 30.0–36.0)
MCV: 90.1 fl (ref 78.0–100.0)
Monocytes Absolute: 0.5 10*3/uL (ref 0.1–1.0)
Monocytes Relative: 10 % (ref 3.0–12.0)
Neutro Abs: 3.2 10*3/uL (ref 1.4–7.7)
Neutrophils Relative %: 60.8 % (ref 43.0–77.0)
Platelets: 270 10*3/uL (ref 150.0–400.0)
RBC: 4.71 Mil/uL (ref 3.87–5.11)
RDW: 13.3 % (ref 11.5–15.5)
WBC: 5.3 10*3/uL (ref 4.0–10.5)

## 2020-05-25 LAB — COMPREHENSIVE METABOLIC PANEL
ALT: 24 U/L (ref 0–35)
AST: 21 U/L (ref 0–37)
Albumin: 4 g/dL (ref 3.5–5.2)
Alkaline Phosphatase: 98 U/L (ref 39–117)
BUN: 14 mg/dL (ref 6–23)
CO2: 31 mEq/L (ref 19–32)
Calcium: 9.1 mg/dL (ref 8.4–10.5)
Chloride: 103 mEq/L (ref 96–112)
Creatinine, Ser: 0.67 mg/dL (ref 0.40–1.20)
GFR: 90.38 mL/min (ref >60.00)
Glucose, Bld: 77 mg/dL (ref 70–99)
Potassium: 3.8 mEq/L (ref 3.5–5.1)
Sodium: 139 mEq/L (ref 135–145)
Total Bilirubin: 0.4 mg/dL (ref 0.2–1.2)
Total Protein: 6.7 g/dL (ref 6.0–8.3)

## 2020-05-25 LAB — LIPID PANEL
Cholesterol: 121 mg/dL (ref 0–200)
HDL: 44.5 mg/dL (ref >39.00)
LDL Cholesterol: 37 mg/dL (ref 0–99)
NonHDL: 76.65
Total CHOL/HDL Ratio: 3
Triglycerides: 197 mg/dL — ABNORMAL HIGH (ref 0.0–149.0)
VLDL: 39.4 mg/dL (ref 0.0–40.0)

## 2020-06-02 DIAGNOSIS — L814 Other melanin hyperpigmentation: Secondary | ICD-10-CM | POA: Diagnosis not present

## 2020-06-02 DIAGNOSIS — Z85828 Personal history of other malignant neoplasm of skin: Secondary | ICD-10-CM | POA: Diagnosis not present

## 2020-06-02 DIAGNOSIS — L570 Actinic keratosis: Secondary | ICD-10-CM | POA: Diagnosis not present

## 2020-06-02 DIAGNOSIS — C441121 Basal cell carcinoma of skin of right upper eyelid, including canthus: Secondary | ICD-10-CM | POA: Diagnosis not present

## 2020-06-02 DIAGNOSIS — L820 Inflamed seborrheic keratosis: Secondary | ICD-10-CM | POA: Diagnosis not present

## 2020-06-02 DIAGNOSIS — L918 Other hypertrophic disorders of the skin: Secondary | ICD-10-CM | POA: Diagnosis not present

## 2020-06-02 DIAGNOSIS — L821 Other seborrheic keratosis: Secondary | ICD-10-CM | POA: Diagnosis not present

## 2020-06-02 DIAGNOSIS — D225 Melanocytic nevi of trunk: Secondary | ICD-10-CM | POA: Diagnosis not present

## 2020-06-02 DIAGNOSIS — C44319 Basal cell carcinoma of skin of other parts of face: Secondary | ICD-10-CM | POA: Diagnosis not present

## 2020-06-02 DIAGNOSIS — D485 Neoplasm of uncertain behavior of skin: Secondary | ICD-10-CM | POA: Diagnosis not present

## 2020-06-11 ENCOUNTER — Ambulatory Visit (INDEPENDENT_AMBULATORY_CARE_PROVIDER_SITE_OTHER): Payer: Medicare Other

## 2020-06-11 ENCOUNTER — Other Ambulatory Visit: Payer: Self-pay

## 2020-06-11 VITALS — BP 120/80 | HR 80 | Temp 98.3°F | Wt 145.0 lb

## 2020-06-11 DIAGNOSIS — Z0000 Encounter for general adult medical examination without abnormal findings: Secondary | ICD-10-CM | POA: Diagnosis not present

## 2020-06-11 NOTE — Patient Instructions (Addendum)
Brianna Simon , Thank you for taking time to come for your Medicare Wellness Visit. I appreciate your ongoing commitment to your health goals. Please review the following plan we discussed and let me know if I can assist you in the future.   Screening recommendations/referrals: Colonoscopy: Done 03/21/18 Cologuard Mammogram: Done 02/09/20 Bone Density: Done 10/02/17  Recommended yearly ophthalmology/optometry visit for glaucoma screening and checkup Recommended yearly dental visit for hygiene and checkup  Vaccinations: Influenza vaccine: Done 03/18/20 Up to date Pneumococcal vaccine: Up to date Tdap vaccine: Up to date Shingles vaccine: Completed 5/15 & 12/26/16   Covid-19:Completed 2/2, 2/23, & 03/01/20  Advanced directives: Please bring a copy of your health care power of attorney and living will to the office at your convenience.  Conditions/risks identified: Lose weight   Next appointment: Follow up in one year for your annual wellness visit    Preventive Care 65 Years and Older, Female Preventive care refers to lifestyle choices and visits with your health care provider that can promote health and wellness. What does preventive care include?  A yearly physical exam. This is also called an annual well check.  Dental exams once or twice a year.  Routine eye exams. Ask your health care provider how often you should have your eyes checked.  Personal lifestyle choices, including:  Daily care of your teeth and gums.  Regular physical activity.  Eating a healthy diet.  Avoiding tobacco and drug use.  Limiting alcohol use.  Practicing safe sex.  Taking low-dose aspirin every day.  Taking vitamin and mineral supplements as recommended by your health care provider. What happens during an annual well check? The services and screenings done by your health care provider during your annual well check will depend on your age, overall health, lifestyle risk factors, and family  history of disease. Counseling  Your health care provider may ask you questions about your:  Alcohol use.  Tobacco use.  Drug use.  Emotional well-being.  Home and relationship well-being.  Sexual activity.  Eating habits.  History of falls.  Memory and ability to understand (cognition).  Work and work Statistician.  Reproductive health. Screening  You may have the following tests or measurements:  Height, weight, and BMI.  Blood pressure.  Lipid and cholesterol levels. These may be checked every 5 years, or more frequently if you are over 53 years old.  Skin check.  Lung cancer screening. You may have this screening every year starting at age 7 if you have a 30-pack-year history of smoking and currently smoke or have quit within the past 15 years.  Fecal occult blood test (FOBT) of the stool. You may have this test every year starting at age 41.  Flexible sigmoidoscopy or colonoscopy. You may have a sigmoidoscopy every 5 years or a colonoscopy every 10 years starting at age 60.  Hepatitis C blood test.  Hepatitis B blood test.  Sexually transmitted disease (STD) testing.  Diabetes screening. This is done by checking your blood sugar (glucose) after you have not eaten for a while (fasting). You may have this done every 1-3 years.  Bone density scan. This is done to screen for osteoporosis. You may have this done starting at age 41.  Mammogram. This may be done every 1-2 years. Talk to your health care provider about how often you should have regular mammograms. Talk with your health care provider about your test results, treatment options, and if necessary, the need for more tests. Vaccines  Your  health care provider may recommend certain vaccines, such as:  Influenza vaccine. This is recommended every year.  Tetanus, diphtheria, and acellular pertussis (Tdap, Td) vaccine. You may need a Td booster every 10 years.  Zoster vaccine. You may need this after  age 70.  Pneumococcal 13-valent conjugate (PCV13) vaccine. One dose is recommended after age 30.  Pneumococcal polysaccharide (PPSV23) vaccine. One dose is recommended after age 16. Talk to your health care provider about which screenings and vaccines you need and how often you need them. This information is not intended to replace advice given to you by your health care provider. Make sure you discuss any questions you have with your health care provider. Document Released: 05/28/2015 Document Revised: 01/19/2016 Document Reviewed: 03/02/2015 Elsevier Interactive Patient Education  2017 [place] Prevention in the Home Falls can cause injuries. They can happen to people of all ages. There are many things you can do to make your home safe and to help prevent falls. What can I do on the outside of my home?  Regularly fix the edges of walkways and driveways and fix any cracks.  Remove anything that might make you trip as you walk through a door, such as a raised step or threshold.  Trim any bushes or trees on the path to your home.  Use bright outdoor lighting.  Clear any walking paths of anything that might make someone trip, such as rocks or tools.  Regularly check to see if handrails are loose or broken. Make sure that both sides of any steps have handrails.  Any raised decks and porches should have guardrails on the edges.  Have any leaves, snow, or ice cleared regularly.  Use sand or salt on walking paths during winter.  Clean up any spills in your garage right away. This includes oil or grease spills. What can I do in the bathroom?  Use night lights.  Install grab bars by the toilet and in the tub and shower. Do not use towel bars as grab bars.  Use non-skid mats or decals in the tub or shower.  If you need to sit down in the shower, use a plastic, non-slip stool.  Keep the floor dry. Clean up any water that spills on the floor as soon as it  happens.  Remove soap buildup in the tub or shower regularly.  Attach bath mats securely with double-sided non-slip rug tape.  Do not have throw rugs and other things on the floor that can make you trip. What can I do in the bedroom?  Use night lights.  Make sure that you have a light by your bed that is easy to reach.  Do not use any sheets or blankets that are too big for your bed. They should not hang down onto the floor.  Have a firm chair that has side arms. You can use this for support while you get dressed.  Do not have throw rugs and other things on the floor that can make you trip. What can I do in the kitchen?  Clean up any spills right away.  Avoid walking on wet floors.  Keep items that you use a lot in easy-to-reach places.  If you need to reach something above you, use a strong step stool that has a grab bar.  Keep electrical cords out of the way.  Do not use floor polish or wax that makes floors slippery. If you must use wax, use non-skid floor wax.  Do not  have throw rugs and other things on the floor that can make you trip. What can I do with my stairs?  Do not leave any items on the stairs.  Make sure that there are handrails on both sides of the stairs and use them. Fix handrails that are broken or loose. Make sure that handrails are as long as the stairways.  Check any carpeting to make sure that it is firmly attached to the stairs. Fix any carpet that is loose or worn.  Avoid having throw rugs at the top or bottom of the stairs. If you do have throw rugs, attach them to the floor with carpet tape.  Make sure that you have a light switch at the top of the stairs and the bottom of the stairs. If you do not have them, ask someone to add them for you. What else can I do to help prevent falls?  Wear shoes that:  Do not have high heels.  Have rubber bottoms.  Are comfortable and fit you well.  Are closed at the toe. Do not wear sandals.  If you  use a stepladder:  Make sure that it is fully opened. Do not climb a closed stepladder.  Make sure that both sides of the stepladder are locked into place.  Ask someone to hold it for you, if possible.  Clearly mark and make sure that you can see:  Any grab bars or handrails.  First and last steps.  Where the edge of each step is.  Use tools that help you move around (mobility aids) if they are needed. These include:  Canes.  Walkers.  Scooters.  Crutches.  Turn on the lights when you go into a dark area. Replace any light bulbs as soon as they burn out.  Set up your furniture so you have a clear path. Avoid moving your furniture around.  If any of your floors are uneven, fix them.  If there are any pets around you, be aware of where they are.  Review your medicines with your doctor. Some medicines can make you feel dizzy. This can increase your chance of falling. Ask your doctor what other things that you can do to help prevent falls. This information is not intended to replace advice given to you by your health care provider. Make sure you discuss any questions you have with your health care provider. Document Released: 02/25/2009 Document Revised: 10/07/2015 Document Reviewed: 06/05/2014 Elsevier Interactive Patient Education  2017 [employer].

## 2020-06-11 NOTE — Progress Notes (Signed)
Subjective:   Brianna Simon is a 70 y.o. female who presents for Medicare Annual (Subsequent) preventive examination.  Review of Systems     Cardiac Risk Factors include: advanced age (>86men, >76 women);hypertension;dyslipidemia     Objective:    Today's Vitals   06/11/20 1432  BP: 120/80  Pulse: 80  Temp: 98.3 F (36.8 C)  SpO2: 96%  Weight: 145 lb (65.8 kg)   Body mass index is 26.52 kg/m.  Advanced Directives 06/11/2020 04/28/2019 02/27/2015 02/26/2015 10/23/2013  Does Patient Have a Medical Advance Directive? Yes Yes No No Patient does not have advance directive;Patient would not like information  Type of Scientist, forensic Power of [place];Living will Living will;Healthcare Power of Attorney - - -  Does patient want to make changes to medical advance directive? - No - Patient declined - - -  Copy of [place] in Chart? No - copy requested No - copy requested - - -  Pre-existing out of facility DNR order (yellow form or pink MOST form) - - - - No    Current Medications (verified) Outpatient Encounter Medications as of 06/11/2020  Medication Sig  . azelastine (ASTELIN) 0.1 % nasal spray INSTILL 1 SPRAY IN EACH NOSTRIL TWICE A DAY  . cholecalciferol (VITAMIN D) 1000 units tablet Take 2,000 Units by mouth daily.   . clopidogrel (PLAVIX) 75 MG tablet TAKE 1 TABLET BY MOUTH EVERY DAY  . escitalopram (LEXAPRO) 10 MG tablet Take 1.5 tablets (15 mg total) by mouth daily. Patient takes 1.5 tablets daily  . hydrochlorothiazide (HYDRODIURIL) 25 MG tablet TAKE 1/2 TABLET BY MOUTH EVERY DAY  . ipratropium (ATROVENT) 0.06 % nasal spray INSTILL 2 SPRAYS IN EACH NOSTRIL 3 TIMES DAILY AS NEEDED  . levocetirizine (XYZAL) 5 MG tablet Take 5 mg by mouth every morning.  . Melatonin 1 MG TABS Take 5 mg by mouth daily.  . Multiple Vitamin (MULTIVITAMIN) tablet Take 1 tablet by mouth daily.  . pantoprazole (PROTONIX) 20 MG tablet Take 1 tablet (20 mg  total) by mouth daily.  . rosuvastatin (CRESTOR) 40 MG tablet Take 1 tablet (40 mg total) by mouth daily.  . [DISCONTINUED] traMADol (ULTRAM) 50 MG tablet tramadol 50 mg tablet  TAKE 1 TABLET (50 MG TOTAL) BY MOUTH EVERY 6 (SIX) HOURS AS NEEDED FOR MODERATE PAIN OR SEVERE PAIN. (Patient not taking: Reported on 05/24/2020)   No facility-administered encounter medications on file as of 06/11/2020.    Allergies (verified) Citalopram hydrobromide, Codeine phosphate, Erythromycin, Nitrofurantoin, Nsaids, Sulfamethoxazole, and Sulfamethoxazole-trimethoprim   History: Past Medical History:  Diagnosis Date  . Anxiety   . Arthritis    hips, hands, lower back - otc med prn  . Cancer ([place])    skin cancer left wrist and left chest  . GERD (gastroesophageal reflux disease)   . Hx: UTI (urinary tract infection)   . Hyperlipidemia   . Hypertension   . Mitral valve prolapse    does not cause patient any problems  . Seasonal allergies   . Stroke [facility]) 2011   mild  . SVD (spontaneous vaginal delivery)    x 1 - gave up for adoption   Past Surgical History:  Procedure Laterality Date  . 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 COLONOSCOPY    . DILATATION & CURETTAGE/HYSTEROSCOPY WITH TRUECLEAR N/A 10/24/2013   Procedure: DILATATION & CURETTAGE/HYSTEROSCOPY WITH TRUCLEAR;  Surgeon: Logan Bores, MD;  Location: [facility];  Service: Gynecology;  Laterality: N/A;  1 1/2hrs OR time  . EP IMPLANTABLE DEVICE N/A 03/02/2015   Procedure: Loop Recorder Insertion;  Surgeon: Will Meredith Leeds, MD;  Location: [facility];  Service: Cardiovascular;  Laterality: N/A;  . lipoma removed     right shoulder  . SINUS SURGERY WITH INSTATRAK    . WISDOM TOOTH EXTRACTION     Family History  Problem Relation Age of Onset  . Arrhythmia Mother 109       pacemaker   . Stroke Mother   . Stroke Paternal Grandmother   . Diverticulitis Other   . Breast cancer Maternal Aunt   .  Breast cancer Cousin    Social History   Socioeconomic History  . Marital status: Married    Spouse name: Not on file  . Number of children: Not on file  . Years of education: Not on file  . Highest education level: Not on file  Occupational History  . Occupation: [place] [employer]    Employer: [employer]  Tobacco Use  . Smoking status: Former Smoker    Packs/day: 2.00    Years: 30.00    Pack years: 60.00    Types: Cigarettes    Quit date: 03/17/2003    Years since quitting: 17.2  . Smokeless tobacco: Never Used  Substance and Sexual Activity  . Alcohol use: Yes    Alcohol/week: 3.0 standard drinks    Types: 1 Glasses of wine, 1 Cans of beer, 1 Shots of liquor per week    Comment: occasionally  . Drug use: No  . Sexual activity: Yes    Birth control/protection: Post-menopausal  Other Topics Concern  . Not on file  Social History Narrative   Married. No children. Husband patient of Dr. 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       Hobbies: dancing, time out with husband, enjoys concerts   Social Determinants of Health   Financial Resource Strain: Low Risk   . Difficulty of Paying Living Expenses: Not hard at all  Food Insecurity: No Food Insecurity  . Worried About Charity fundraiser in the Last Year: Never true  . Ran Out of Food in the Last Year: Never true  Transportation Needs: No Transportation Needs  . Lack of Transportation (Medical): No  . Lack of Transportation (Non-Medical): No  Physical Activity: Inactive  . Days of Exercise per Week: 0 days  . Minutes of Exercise per Session: 0 min  Stress: Stress Concern Present  . Feeling of Stress : To some extent  Social Connections: Moderately Isolated  . Frequency of Communication with Friends and Family: Twice a week  . Frequency of Social Gatherings with Friends and Family: Once a week  . Attends Religious Services: Never  . Active Member of Clubs or Organizations: No  . Attends  Archivist Meetings: Never  . Marital Status: Married    Tobacco Counseling Counseling given: Not Answered   Clinical Intake:  Pre-visit preparation completed: Yes  Pain : No/denies pain     BMI - recorded: 26.52 Nutritional Status: BMI 25 -29 Overweight Nutritional Risks: None Diabetes: No  How often do you need to have someone help you when you read instructions, pamphlets, or other written materials from your doctor or pharmacy?: 1 - Never  Diabetic?No  Interpreter Needed?: No  Information entered by :: Charlott Rakes, LPN   Activities of Daily Living In your present state of  health, do you have any difficulty performing the following activities: 06/11/2020  Hearing? Y  Comment wears hearing aids  Vision? N  Difficulty concentrating or making decisions? Y  Comment concentrating at times  Walking or climbing stairs? N  Dressing or bathing? N  Doing errands, shopping? N  Preparing Food and eating ? N  Using the Toilet? N  In the past six months, have you accidently leaked urine? N  Do you have problems with loss of bowel control? N  Managing your Medications? N  Managing your Finances? N  Some recent data might be hidden    Patient Care Team: Marin Olp, MD as PCP - General (Family Medicine) Paula Compton, MD as Consulting Physician (Obstetrics and Gynecology) Evelina Bucy, DPM as Consulting Physician (Podiatry) Garvin Fila, MD as Consulting Physician (Neurology) [facility] (Ophthalmology)  Indicate any recent Medical Services you may have received from other than [facility] providers in the past year (date may be approximate).     Assessment:   This is a routine wellness examination for [place].  Hearing/Vision screen  Hearing Screening   125Hz  250Hz  500Hz  1000Hz  2000Hz  3000Hz  4000Hz  6000Hz  8000Hz   Right ear:           Left ear:           Comments: Wears hearing aids  Vision Screening Comments: Pt follows up  annually with Dr Ishmael Holter for eye exams  Dietary issues and exercise activities discussed: Current Exercise Habits: The patient does not participate in regular exercise at present  Goals    . Patient Stated     Lose weight      Depression Screen PHQ 2/9 Scores 06/11/2020 05/24/2020 11/14/2019 05/06/2019 04/28/2019 11/05/2018 03/01/2018  PHQ - 2 Score 1 1 2  0 2 4 0  PHQ- 9 Score - 6 10 8 4 15  -    Fall Risk Fall Risk  06/11/2020 12/25/2019 11/14/2019 04/28/2019 03/01/2018  Falls in the past year? 1 0 0 0 No  Number falls in past yr: 1 0 - - -  Injury with Fall? 0 0 - 0 -  Risk for fall due to : Impaired vision;Impaired balance/gait;Impaired mobility - - - -  Follow up Falls prevention discussed - - Falls evaluation completed;Education provided;Falls prevention discussed -    FALL RISK PREVENTION PERTAINING TO THE HOME:  Any stairs in or around the home? Yes  If so, are there any without handrails? Yes  Home free of loose throw rugs in walkways, pet beds, electrical cords, etc? Yes  Adequate lighting in your home to reduce risk of falls? Yes   ASSISTIVE DEVICES UTILIZED TO PREVENT FALLS:  Life alert? Yes  Use of a cane, walker or w/c? No  Grab bars in the bathroom? No  Shower chair or bench in shower? No  Elevated toilet seat or a handicapped toilet? No   TIMED UP AND GO:  Was the test performed? Yes .  Length of time to ambulate 10 feet: 10 sec.   Gait steady and fast without use of assistive device  Cognitive Function:     6CIT Screen 06/11/2020 04/28/2019  What Year? 0 points 0 points  What month? 0 points 0 points  What time? - 0 points  Count back from 20 0 points 0 points  Months in reverse 0 points 0 points  Repeat phrase 0 points 0 points  Total Score - 0    Immunizations Immunization History  Administered Date(s) Administered  .  Fluad Quad(high Dose 65+) 03/04/2019, 03/18/2020  . Influenza Split 03/17/2011, 02/09/2012  . Influenza Whole 02/12/2008,  02/22/2010  . Influenza, High Dose Seasonal PF 02/27/2018  . Influenza,inj,Quad PF,6+ Mos 02/10/2013, 03/09/2014, 02/05/2015, 02/25/2016, 02/23/2017  . PFIZER(Purple Top)SARS-COV-2 Vaccination 06/17/2019, 07/08/2019, 03/01/2020  . Pneumococcal Conjugate-13 11/14/2019  . Pneumococcal Polysaccharide-23 11/05/2018  . Tdap 10/27/2014  . Zoster 04/03/2013  . Zoster Recombinat (Shingrix) 09/26/2016, 12/26/2016    TDAP status: Up to date  Flu Vaccine status: Up to date done 03/18/20  Pneumococcal vaccine status: Up to date  Covid-19 vaccine status: Completed vaccines  Qualifies for Shingles Vaccine? Yes   Zostavax completed Yes   Shingrix Completed?: Yes  Screening Tests Health Maintenance  Topic Date Due  . Fecal DNA (Cologuard)  03/21/2021  . MAMMOGRAM  01/28/2022  . TETANUS/TDAP  10/26/2024  . INFLUENZA VACCINE  Completed  . DEXA SCAN  Completed  . COVID-19 Vaccine  Completed  . Hepatitis C Screening  Completed  . PNA vac Low Risk Adult  Completed    Health Maintenance  There are no preventive care reminders to display for this patient.  Colorectal cancer screening: Type of screening: Cologuard. Completed 03/21/18. Repeat every 3 years  Mammogram status: Completed 02/09/20. Repeat every year  Bone Density status: Completed 10/02/17. Results reflect: Bone density results: OSTEOPENIA. Repeat every 2 years.   Additional Screening:  Hepatitis C Screening:  Completed 10/27/14  Vision Screening: Recommended annual ophthalmology exams for early detection of glaucoma and other disorders of the eye. Is the patient up to date with their annual eye exam?  Yes  Who is the provider or what is the name of the office in which the patient attends annual eye exams? Dr Ishmael Holter  If pt is not established with a provider, would they like to be referred to a provider to establish care? No .   Dental Screening: Recommended annual dental exams for proper oral hygiene  Community Resource  Referral / Chronic Care Management: CRR required this visit?  No   CCM required this visit?  No      Plan:     I have personally reviewed and noted the following in the patient's chart:   . Medical and social history . Use of alcohol, tobacco or illicit drugs  . Current medications and supplements . Functional ability and status . Nutritional status . Physical activity . Advanced directives . List of other physicians . Hospitalizations, surgeries, and ER visits in previous 12 months . Vitals . Screenings to include cognitive, depression, and falls . Referrals and appointments  In addition, I have reviewed and discussed with patient certain preventive protocols, quality metrics, and best practice recommendations. A written personalized care plan for preventive services as well as general preventive health recommendations were provided to patient.     Willette Brace, LPN   [id number]   Nurse Notes: Pt wanted to start taking Collagen and was requesting your advise. Please advise?

## 2020-07-15 ENCOUNTER — Telehealth: Payer: Self-pay

## 2020-07-15 NOTE — Telephone Encounter (Signed)
Please schedule virtual or OV for pt to discuss.

## 2020-07-15 NOTE — Telephone Encounter (Signed)
Pt.s left hip is causing her severe pain from her arthritis. She has tried tylenol extra strength, but it is not helping. She is taking tramadol at night to help with the pain so she can sleep, but is requesting something to take for pain in the day.

## 2020-07-16 NOTE — Telephone Encounter (Signed)
Pt states back in August you gave her Tramadol to take at night for a procedure she had done and she has been taking that along with Tylenol Arthritis and it is not helping, she states if she didn't take this at night she knows he won't be able to sleep but she is needing some kind of day relief and would like something sent in to get her to Tuesday until she can see you.

## 2020-07-16 NOTE — Telephone Encounter (Signed)
Patient is calling in, she is scheduled for Tuesday with Yong Channel but is wanting to speak with a nurse about the medications. Sheran Luz not go in to detail as she would like to save her message for a nurse.

## 2020-07-17 MED ORDER — TRAMADOL HCL 50 MG PO TABS: 50.0000 mg | ORAL_TABLET | Freq: Two times a day (BID) | ORAL | 0 refills | Status: DC | PRN

## 2020-07-17 NOTE — Addendum Note (Signed)
Addended by: Marin Olp on: 07/17/2020 10:17 AM   Modules accepted: Orders

## 2020-07-17 NOTE — Telephone Encounter (Signed)
I sent in tramadol for her- do not drive for 8 hours after using. Can take twice a day. If worsenign pain seek care over weekend

## 2020-07-20 ENCOUNTER — Ambulatory Visit (INDEPENDENT_AMBULATORY_CARE_PROVIDER_SITE_OTHER): Payer: Medicare Other | Admitting: Family Medicine

## 2020-07-20 ENCOUNTER — Other Ambulatory Visit: Payer: Self-pay

## 2020-07-20 ENCOUNTER — Encounter: Payer: Self-pay | Admitting: Family Medicine

## 2020-07-20 VITALS — BP 126/72 | HR 72 | Temp 98.6°F | Ht 62.0 in | Wt 147.0 lb

## 2020-07-20 DIAGNOSIS — M5442 Lumbago with sciatica, left side: Secondary | ICD-10-CM | POA: Diagnosis not present

## 2020-07-20 DIAGNOSIS — M7062 Trochanteric bursitis, left hip: Secondary | ICD-10-CM | POA: Diagnosis not present

## 2020-07-20 MED ORDER — PREDNISONE 20 MG PO TABS: ORAL_TABLET | ORAL | 0 refills | Status: DC

## 2020-07-20 NOTE — Patient Instructions (Addendum)
70 year old female with left back/hip/leg pain without midline pain and without any red flags-I am concerned she may have some radicular pain possibly related to underlying arthritis.  She is also extremely tender over left greater trochanteric bursa-concerning for trochanteric bursitis. -We opted to try a course of prednisone which I think will help with both of these issues -We discussed referral to physical therapy but she would like to try medicine first-she will reach out if not making substantial improvement by day 10-14 -If physical therapy also is not adequate enough can also refer to sports medicine for further evaluation - try heat 15 minutes 3x a day -for muscle tightness in back- massage would be reasonable  Recommended follow up: For new or worsening symptoms or failure to improve please reach out to [place]

## 2020-07-20 NOTE — Progress Notes (Signed)
Phone [phone] In person visit   Subjective:   Brianna Simon is a 70 y.o. year old very pleasant female patient who presents for/with See problem oriented charting Chief Complaint  Patient presents with  . Hip Pain    Left hip     This visit occurred during the SARS-CoV-2 public health emergency.  Safety protocols were in place, including screening questions prior to the visit, additional usage of staff PPE, and extensive cleaning of exam room while observing appropriate contact time as indicated for disinfecting solutions.   Past Medical History-  Patient Active Problem List   Diagnosis Date Noted  . Right hemiplegia ([place]) 05/06/2019    Priority: High  . History of CVA (cerebrovascular accident) 02/22/2010    Priority: High  . Fatty liver 05/24/2020    Priority: Medium  . Depression, major, single episode, moderate ([place]) 11/14/2019    Priority: Medium  . Hemiplegia affecting dominant side, 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[place]) 03/01/2018    Priority: Medium  . Osteopenia 10/02/2017    Priority: Medium  . Former smoker 12/09/2014    Priority: Medium  . Anxiety state 10/27/2014    Priority: Medium  . Hyperlipidemia 02/22/2010    Priority: Medium  . Essential hypertension 12/07/2006    Priority: Medium  . Senile purpura ([place]) 11/05/2018    Priority: Low  . Allergic rhinitis 10/27/2014    Priority: Low  . Esophageal reflux 12/31/2006    Priority: Low  . Insomnia 12/31/2006    Priority: Low  . MVP (mitral valve prolapse) 12/10/2006    Priority: Low  . Alkaline phosphatase elevation 06/16/2019  . Benign paroxysmal positional vertigo 11/13/2016  . Low back pain 09/26/2016  . Dysarthria 03/02/2015    Medications- reviewed and updated Current Outpatient Medications  Medication Sig Dispense Refill  . azelastine (ASTELIN) 0.1 % nasal spray INSTILL 1 SPRAY IN EACH NOSTRIL TWICE A DAY  5  . cholecalciferol (VITAMIN D) 1000 units tablet Take 2,000 Units by mouth daily.     .  clopidogrel (PLAVIX) 75 MG tablet TAKE 1 TABLET BY MOUTH EVERY DAY 90 tablet 3  . escitalopram (LEXAPRO) 10 MG tablet Take 1.5 tablets (15 mg total) by mouth daily. Patient takes 1.5 tablets daily 135 tablet 3  . hydrochlorothiazide (HYDRODIURIL) 25 MG tablet TAKE 1/2 TABLET BY MOUTH EVERY DAY 45 tablet 1  . ipratropium (ATROVENT) 0.06 % nasal spray INSTILL 2 SPRAYS IN EACH NOSTRIL 3 TIMES DAILY AS NEEDED  5  . levocetirizine (XYZAL) 5 MG tablet Take 5 mg by mouth every morning.    . Melatonin 1 MG TABS Take 5 mg by mouth daily.    . Multiple Vitamin (MULTIVITAMIN) tablet Take 1 tablet by mouth daily.    . pantoprazole (PROTONIX) 20 MG tablet Take 1 tablet (20 mg total) by mouth daily. 90 tablet 3  . predniSONE (DELTASONE) 20 MG tablet Take 2 pills for 3 days, 1 pill for 4 days 10 tablet 0  . rosuvastatin (CRESTOR) 40 MG tablet Take 1 tablet (40 mg total) by mouth daily. 90 tablet 3  . traMADol (ULTRAM) 50 MG tablet Take 1 tablet (50 mg total) by mouth every 12 (twelve) hours as needed for moderate pain or severe pain (do not drive for 8 hours after use). 10 tablet 0   No current facility-administered medications for this visit.     Objective:  BP 126/72   Pulse 72   Temp 98.6 F (37 C) (Temporal)   Ht 5\' 2"  (1.575 m)  Wt 147 lb (66.7 kg)   LMP  (LMP Unknown)   SpO2 94%   BMI 26.89 kg/m  Gen: NAD, resting comfortably CV: RRR no murmurs rubs or gallops Lungs: CTAB no crackles, wheeze, rhonchi Ext: no edema Back - Normal skin, Spine with normal alignment and no deformity.  No tenderness to vertebral process palpation.  Paraspinous muscles are tender and wit spasm on left but not the right.   Range of motion is full at neck and lumbar sacral regions. Negative Straight leg raise.  Neuro- no saddle anesthesia, 5/5 strength lower extremities Hip exam with full range of motion-she did get some pain into her back and lateral hip with some of these motions but no groin pain.  Negative  Stinchfield test.  She did have mild pain with Corky Sox.  Most significant pain was with palpation of left paraspinous muscles as above and also with palpation over left greater trochanter    Assessment and Plan  Left back/Hip/leg Pain S: sometime in late February patient noted some pain in back of left leg. Woke up Monday morning and hip hurt severely bad in left buttocks and lateral hip. No groin pain. 10/10 pain- could barely sit on toilet. Sitting made it worse- could not find a better position- moving helped minimally. Took tramadol at night to help her sleep- was able to get some sleep with the tramadol. No falls or injuries. No heavy lifting. Some numness/tingling down the leg  Today reports 70% better. Pain persists in buttocks and lateral hip but much better perhaps 3/10 pain. Only taking tramadol at nighttime. Felt a little off when took in daytime- a little woozy. She did have some pain going down to her ankle on the left side  Joint flex helps some ROS-No saddle anesthesia, bladder incontinence, fecal incontinence, weakness in extremity, numbness or tingling in extremity. History negative for trauma, history of cancer, fever, chills, unintentional weight loss, recent bacterial infection, recent IV drug use, HIV, pain worse at night or while supine.   A/P: 70 year old female with left back/hip/leg pain without midline pain and without any red flags-I am concerned she may have some radicular pain possibly related to underlying arthritis.  She is also extremely tender over left greater trochanteric bursa-concerning for trochanteric bursitis. -We opted to try a course of prednisone which I think will help with both of these issues -We discussed referral to physical therapy but she would like to try medicine first-she will reach out if not making substantial improvement by day 10-14 -If physical therapy also is not adequate enough can also refer to sports medicine for further evaluation -With no  midline pain and no fall/trauma we did not think x-rays are necessary at this time but may need to be reconsidered at a later date  Recommended follow up: For new or worsening symptoms or failure to improve please reach out to [place] Future Appointments  Date Time Provider [place]  11/23/2020  3:00 PM Marin Olp, MD [facility]  06/17/2021  2:30 PM [facility] HEALTH COACH [facility]    Lab/Order associations:   ICD-10-CM   1. Radicular syndrome of left leg  M54.10   2. Greater trochanteric bursitis of left hip  M70.62     Meds ordered this encounter  Medications  . predniSONE (DELTASONE) 20 MG tablet    Sig: Take 2 pills for 3 days, 1 pill for 4 days    Dispense:  10 tablet    Refill:  0  Return precautions advised.  Garret Reddish, MD

## 2020-08-03 ENCOUNTER — Telehealth: Payer: Self-pay | Admitting: Family Medicine

## 2020-08-03 NOTE — Progress Notes (Signed)
  Chronic Care Management   Outreach Note  08/03/2020 Name: Brianna Simon MRN: [id number] DOB: 06/29/1950  Referred by: Marin Olp, MD Reason for referral : No chief complaint on file.   An unsuccessful telephone outreach was attempted today. The patient was referred to the pharmacist for assistance with care management and care coordination.   Follow Up Plan:   Lauretta Grill Upstream Scheduler

## 2020-08-12 ENCOUNTER — Telehealth: Payer: Self-pay | Admitting: Family Medicine

## 2020-08-12 NOTE — Progress Notes (Signed)
  Chronic Care Management   Outreach Note  08/12/2020 Name: Brianna Simon MRN: [id number] DOB: Apr 05, 1951  Referred by: Marin Olp, MD Reason for referral : No chief complaint on file.   A second unsuccessful telephone outreach was attempted today. The patient was referred to pharmacist for assistance with care management and care coordination.  Follow Up Plan:   Lauretta Grill Upstream Scheduler

## 2020-08-18 ENCOUNTER — Telehealth: Payer: Self-pay | Admitting: Family Medicine

## 2020-08-18 NOTE — Chronic Care Management (AMB) (Signed)
  Chronic Care Management   Note  08/18/2020 Name: Marisa Hage MRN: [id number] DOB: [birth year]  Lenon Oms Shanker is a 69 y.o. year old female who is a primary care patient of Yong Channel, Brayton Mars, MD. I reached out to [employer] by phone today in response to a referral sent by Ms. Lenon Oms Arnott's PCP, Yong Channel Brayton Mars, MD.   Ms. Cumbo was given information about Chronic Care Management services today including:  1. CCM service includes personalized support from designated clinical staff supervised by her physician, including individualized plan of care and coordination with other care providers 2. 24/7 contact phone numbers for assistance for urgent and routine care needs. 3. Service will only be billed when office clinical staff spend 20 minutes or more in a month to coordinate care. 4. Only one practitioner may furnish and bill the service in a calendar month. 5. The patient may stop CCM services at any time (effective at the end of the month) by phone call to the office staff.   Patient agreed to services and verbal consent obtained.   Follow up plan:   Lauretta Grill Upstream Scheduler

## 2020-08-23 ENCOUNTER — Telehealth: Payer: Self-pay

## 2020-08-23 NOTE — Chronic Care Management (AMB) (Signed)
Chronic Care Management Pharmacy Assistant   Name: Brianna Simon  MRN: [id number] DOB: 1950-09-18  Brianna Simon is an 70 y.o. year old female who presents for his initial CCM visit with the clinical pharmacist.  Reason for Encounter: Chart Review for Initial CCM Visit   Conditions to be addressed/monitored: HTN, HLD, MVP, Allergic rhinitis, Esophageal reflux, Fatty liver, BPPV, Hx of CVA, Hemiplegia affecting dominant side, post-stroke, Osteopenia, Insomnia, Anxiety and Depression   Recent office visits:  07/20/2020 OV PCP Brianna Reddish, MD; Rx prednisone for acute low back pain, may consider PT in future if pain persists.  05/24/2020 OV PCP Brianna Reddish, MD; Rosuvastatin 40Mg  increased from atorvastatin 40 mg with goal to get LDL under 70, Plavix 75 mg. No aspirin  Recent consult visits:  06/02/2020 OV (dermatoloy) Brianna Sensing, MD; no further information available.  04/15/2020 OV (podiatry) Brianna Simon, DPM; no medication changes indicated.  Hospital visits:  None in previous 6 months  Medications: Outpatient Encounter Medications as of 08/23/2020  Medication Sig Note  . azelastine (ASTELIN) 0.1 % nasal spray INSTILL 1 SPRAY IN EACH NOSTRIL TWICE A DAY   . cholecalciferol (VITAMIN D) 1000 units tablet Take 2,000 Units by mouth daily.  11/05/2018: Pt currently takes 2000 IU daily  . clopidogrel (PLAVIX) 75 MG tablet TAKE 1 TABLET BY MOUTH EVERY DAY   . COVID-19 mRNA vaccine, Pfizer, 30 MCG/0.3ML injection INJECT AS DIRECTED   . escitalopram (LEXAPRO) 10 MG tablet Take 1.5 tablets (15 mg total) by mouth daily. Patient takes 1.5 tablets daily   . hydrochlorothiazide (HYDRODIURIL) 25 MG tablet TAKE 1/2 TABLET BY MOUTH EVERY DAY   . ipratropium (ATROVENT) 0.06 % nasal spray INSTILL 2 SPRAYS IN EACH NOSTRIL 3 TIMES DAILY AS NEEDED   . levocetirizine (XYZAL) 5 MG tablet Take 5 mg by mouth every morning.   . Melatonin 1 MG TABS Take 5 mg by mouth daily.   .  Multiple Vitamin (MULTIVITAMIN) tablet Take 1 tablet by mouth daily.   . pantoprazole (PROTONIX) 20 MG tablet Take 1 tablet (20 mg total) by mouth daily.   . predniSONE (DELTASONE) 20 MG tablet Take 2 pills for 3 days, 1 pill for 4 days   . rosuvastatin (CRESTOR) 40 MG tablet Take 1 tablet (40 mg total) by mouth daily.   . traMADol (ULTRAM) 50 MG tablet Take 1 tablet (50 mg total) by mouth every 12 (twelve) hours as needed for moderate pain or severe pain (do not drive for 8 hours after use).    No facility-administered encounter medications on file as of 08/23/2020.    Medication Fill History: Azelastine 0.1% nasal spray last filled 01/12/2020 89 DS - uses daily Cholecalciferol (Vitamin D) 1000 units - takes 2000 daily Clopidogrel (Plavix) 75 mg last filled 07/08/2020 90 DS Escitalopram 10 mg last filled 07/31/2020 90 DS HCTZ 25 mg last filled 05/25/2020 90 DS Ipratropium (Atrovent) 0.06% nasal spray last filled 06/30/2020 90 DS Levocetirizine 5 mg last filled 07/08/2020 90 DS Melatonin 1 mg - takes 5 mg nightly Multiple Vitamin - takes daily Pantoprazole 20 mg last filled 08/19/2020 90 DS Tramadol 50 mg last filled 07/17/2020 5 DS Biotin - takes 5000 mcg daily Hair Skin and Nails (natures bounty) takes one daily  Any changes in your medications or health? Patient states she has not had any changes in her medications or health recently.  Any side effects from any medications?  Patient states she does not have any side effects from  any of her medications that she is aware of.  Do you have any symptoms or problems not managed by your medications? Patient states she is not happy with her "stomach medication". She states she is still having to take Tums to help manage her symptoms.   Any concerns about your health right now? Patient states she does not have any major concerns with her health at this time.  Has your provider asked that you check blood pressure, blood sugar, or follow  special diet at home? Patient states she checks her blood pressure occasionally. She does not check her blood sugar. She states she does not follow any specific diet. However, she states she tries to avoid fried foods.  Do you get any type of exercise on a regular basis? Patient states she gets her exercise by working around the house.  Can you think of a goal you would like to reach for your health? Patient stated "I'd like to lose 20 pounds."  Do you have any problems getting your medications? Patient states she does not have any problems getting her medications.  Is there anything that you would like to discuss during the appointment? Patient states she would like to diiscuss her medications.  Please bring medications and supplements to appointment.  Future Appointments  Date Time Provider [place]  08/25/2020  2:00 PM [facility] CCM PHARMACIST [facility]  11/23/2020  3:00 PM Brianna Olp, MD [facility]  06/17/2021  2:30 PM [facility] HEALTH COACH [facility]    Star Rating Drugs: Rosuvastatin 40 mg last filled 06/03/2020 90 DS  Brianna Simon, [place] Pharmacist Assistant [phone]

## 2020-08-25 ENCOUNTER — Ambulatory Visit (INDEPENDENT_AMBULATORY_CARE_PROVIDER_SITE_OTHER): Payer: Medicare Other

## 2020-08-25 DIAGNOSIS — F321 Major depressive disorder, single episode, moderate: Secondary | ICD-10-CM | POA: Diagnosis not present

## 2020-08-25 DIAGNOSIS — I10 Essential (primary) hypertension: Secondary | ICD-10-CM | POA: Diagnosis not present

## 2020-08-25 DIAGNOSIS — E785 Hyperlipidemia, unspecified: Secondary | ICD-10-CM | POA: Diagnosis not present

## 2020-08-25 NOTE — Patient Instructions (Addendum)
Brianna Simon,  Thank you for talking with me today. I have included our care plan/goals in the following pages.   Please review and call me at [phone] with any questions.  Thanks! Brianna Simon, Pharm.D., BCGP Clinical Pharmacist [facility] [place] [phone] Patient Care Plan: [place] Plan    Problem Identified: HLD HTN CVA Hx Depression Osteopenia   Priority: High    Long-Range Goal: Disease Management   Start Date: 08/25/2020  Expected End Date: 08/25/2021  This Visit's Progress: On track  Priority: High  Note:   Current Barriers:  Brianna Simon Maintain HLD control, minimal exercise, carb rich diet  Pharmacist Clinical Goal(s):  Brianna Simon Patient will contact provider office for questions/concerns as evidenced notation of same in electronic health record through collaboration with PharmD and provider.   Interventions: . 1:1 collaboration with Brianna Olp, Simon regarding development and update of comprehensive plan of care as evidenced by provider attestation and co-signature . Inter-disciplinary care team collaboration (see longitudinal plan of care) . Comprehensive medication review performed; medication list updated in electronic medical record . No recommendations/changes  Hypertension (BP goal <130/80) -Controlled -Current treatment: . Hydrochlorothiazide 25 mg tablet - half tablet (12.5 mg) once daily  -Medications previously tried: n/a  -Current home readings: n/a -Denies hypotensive/hypertensive symptoms -Educated on BP goals and benefits of medications for prevention of heart attack, stroke and kidney damage; Daily salt intake goal < 2300 mg; -Recommended to continue current medication  Hyperlipidemia: (LDL goal < 70) -Controlled  -TG elevation 197 on 05/2020 (111 04/2019) -HTN, CVA Hx; former smoker 60 pack year hx (quit >15 years ago) -Current treatment: . Rosuvastatin 40 mg once daily - -Medications  previously tried: atorvastatin 40 mg most recently, simvastatin 20 mg.  -CVA Hx, on Plavix alone.  -Current dietary patterns: frosted mini wheats, coffee. Ham sandwich. Alcohol - does not consume other than occasionally during the weekend.  -Current exercise habits: no formal routine, walking some with husband.  -Educated on Cholesterol goals;  Exercise goal of 150 minutes per week; -Recommended to continue current medication Agreed on inital exercise goal of walking 2-3x/week; minimize carb rich foods such as bread, rice, crackers   Depression (Goal: ensure safe medication use) -Controlled -Current treatment: . Lexapro 10 mg tablet - take 1.5 tablets (15 mg) once daily -Medications previously tried/failed: Zoloft 100 mg once daily  -Educated on Benefits of medication for symptom control -Recommended to continue current medication Declining any additional support at this time  Osteopenia Goal ensure appropriate supplementation, exercise) -Controlled -Last DEXA Scan: 09/2017 - Osteopenia -VitD 42 (04/2019)  -SSRI PPI use -Patient is not a candidate for pharmacologic treatment -Current treatment  . Calcium - none at present . Vitamin D3 2000 units -Recommend 1200 mg of calcium daily from dietary and supplemental sources. Recommend weight-bearing and muscle strengthening exercises for building and maintaining bone density.  GERD (Goal: minimize symptoms) Some occasional breakthrough acid reflux, identifies tomato sauce as common trigger, somewhat aggravated by coffee as well.  -Not ideally controlled -B12 605 (11/2019) -Current treatment  . Pantoprazole 20 mg once daily  -Medications previously tried: Cimetidine, famotidine, pantoprazole 40 mg, zantac, lansoprazole   -Recommended to continue current medication, patient will supplement breakthrough reflux with prn Pepcid 10-20 mg once or twice daily as needed  Patient Goals/Self-Care Activities . Patient will:  - take medications as  prescribed target a minimum of 150 minutes of moderate intensity exercise weekly  supplement  breakthrough reflux with prn Pepcid 10-20 mg once or twice daily   Medication Assistance: None required.  Patient affirms current coverage meets needs. Patient's preferred pharmacy is:  [id number] [place], [place] - [address] [facility] [address]Phone: [phone]: [phone] Follow Up:  Patient agrees to Care Plan and Follow-up. Plan: [place] in 6 months. Pt call in 1 month to review exercise goal of walking 2-3x/week as initial goal, minimizing carb intake including foods like bread/rice/crackers, and acid reflux control (pt now supplementing with 10-20 mg once to twice daily)       The patient was given the following information about Chronic Care Management services today, agreed to services, and gave verbal consent: 1. CCM service includes personalized support from designated clinical staff supervised by the primary care provider, including individualized plan of care and coordination with other care providers 2. 24/7 contact phone numbers for assistance for urgent and routine care needs. 3. Service will only be billed when office clinical staff spend 20 minutes or more in a month to coordinate care. 4. Only one practitioner may furnish and bill the service in a calendar month. 5.The patient may stop CCM services at any time (effective at the end of the month) by phone call to the office staff. 6. The patient will be responsible for cost sharing (co-pay) of up to 20% of the service fee (after annual deductible is met). Patient agreed to services and consent obtained.  The patient verbalized understanding of instructions provided today and agreed to receive a MyChart copy of patient instruction and/or educational materials. Telephone follow up appointment with pharmacy team member scheduled for: See next appointment with "Care Management Staff"  under "What's Next" below.  High Cholesterol  High cholesterol is a condition in which the blood has high levels of a white, waxy substance similar to fat (cholesterol). The liver makes all the cholesterol that the body needs. The human body needs small amounts of cholesterol to help build cells. A person gets extra or excess cholesterol from the food that he or she eats. The blood carries cholesterol from the liver to the rest of the body. If you have high cholesterol, deposits (plaques) may build up on the walls of your arteries. Arteries are the blood vessels that carry blood away from your heart. These plaques make the arteries narrow and stiff. Cholesterol plaques increase your risk for heart attack and stroke. Work with your health care provider to keep your cholesterol levels in a healthy range. What increases the risk? The following factors may make you more likely to develop this condition:  Eating foods that are high in animal fat (saturated fat) or cholesterol.  Being overweight.  Not getting enough exercise.  A family history of high cholesterol (familial hypercholesterolemia).  Use of tobacco products.  Having diabetes. What are the signs or symptoms? There are no symptoms of this condition. How is this diagnosed? This condition may be diagnosed based on the results of a blood test.  If you are older than 68years of age, your health care provider may check your cholesterol levels every 4-6 years.  You may be checked more often if you have high cholesterol or other risk factors for heart disease. The blood test for cholesterol measures:  "Bad" cholesterol, or LDL cholesterol. This is the main type of cholesterol that causes heart disease. The desired level is less than 100 mg/dL.  "Good" cholesterol, or HDL  cholesterol. HDL helps protect against heart disease by cleaning the arteries and carrying the LDL to the liver for processing. The desired level for HDL is 60 mg/dL  or higher.  Triglycerides. These are fats that your body can store or burn for energy. The desired level is less than 150 mg/dL.  Total cholesterol. This measures the total amount of cholesterol in your blood and includes LDL, HDL, and triglycerides. The desired level is less than 200 mg/dL. How is this treated? This condition may be treated with:  Diet changes. You may be asked to eat foods that have more fiber and less saturated fats or added sugar.  Lifestyle changes. These may include regular exercise, maintaining a healthy weight, and quitting use of tobacco products.  Medicines. These are given when diet and lifestyle changes have not worked. You may be prescribed a statin medicine to help lower your cholesterol levels. Follow these instructions at home: Eating and drinking  Eat a healthy, balanced diet. This diet includes: ? Daily servings of a variety of fresh, frozen, or canned fruits and vegetables. ? Daily servings of whole grain foods that are rich in fiber. ? Foods that are low in saturated fats and trans fats. These include poultry and fish without skin, lean cuts of meat, and low-fat dairy products. ? A variety of fish, especially oily fish that contain omega-3 fatty acids. Aim to eat fish at least 2 times a week.  Avoid foods and drinks that have added sugar.  Use healthy cooking methods, such as roasting, grilling, broiling, baking, poaching, steaming, and stir-frying. Do not fry your food except for stir-frying.   Lifestyle  Get regular exercise. Aim to exercise for a total of 150 minutes a week. Increase your activity level by doing activities such as gardening, walking, and taking the stairs.  Do not use any products that contain nicotine or tobacco, such as cigarettes, e-cigarettes, and chewing tobacco. If you need help quitting, ask your health care provider.   General instructions  Take over-the-counter and prescription medicines only as told by your health care  provider.  Keep all follow-up visits as told by your health care provider. This is important. Where to find more information  American Heart Association: www.heart.org  National Heart, Lung, and Blood Institute: https://wilson-eaton.com/ Contact a health care provider if:  You have trouble achieving or maintaining a healthy diet or weight.  You are starting an exercise program.  You are unable to stop smoking. Get help right away if:  You have chest pain.  You have trouble breathing.  You have any symptoms of a stroke. "BE FAST" is an easy way to remember the main warning signs of a stroke: ? B - Balance. Signs are dizziness, sudden trouble walking, or loss of balance. ? E - Eyes. Signs are trouble seeing or a sudden change in vision. ? F - Face. Signs are sudden weakness or numbness of the face, or the face or eyelid drooping on one side. ? A - Arms. Signs are weakness or numbness in an arm. This happens suddenly and usually on one side of the body. ? S - Speech. Signs are sudden trouble speaking, slurred speech, or trouble understanding what people say. ? T - Time. Time to call emergency services. Write down what time symptoms started.  You have other signs of a stroke, such as: ? A sudden, severe headache with no known cause. ? Nausea or vomiting. ? Seizure. These symptoms may represent a serious problem that  is an emergency. Do not wait to see if the symptoms will go away. Get medical help right away. Call your local emergency services (911 in the [place] Do not drive yourself to the hospital. Summary  Cholesterol plaques increase your risk for heart attack and stroke. Work with your health care provider to keep your cholesterol levels in a healthy range.  Eat a healthy, balanced diet, get regular exercise, and maintain a healthy weight.  Do not use any products that contain nicotine or tobacco, such as cigarettes, e-cigarettes, and chewing tobacco.  Get help right away if you  have any symptoms of a stroke. This information is not intended to replace advice given to you by your health care provider. Make sure you discuss any questions you have with your health care provider. Document Revised: 03/31/2019 Document Reviewed: 03/31/2019 Elsevier Patient Education  2021 [employer].

## 2020-08-25 NOTE — Progress Notes (Signed)
Chronic Care Management Pharmacy Note  08/25/2020 Name:  Brianna Simon MRN:  [id number] DOB:  04-Jun-1950  Subjective: Brianna Simon is an 70 y.o. year old female who is a primary patient of Hunter, Brayton Mars, MD.  The CCM team was consulted for assistance with disease management and care coordination needs.    Engaged with patient by telephone for initial visit in response to provider referral for pharmacy case management and/or care coordination services.   Consent to Services:  The patient was given the following information about Chronic Care Management services today, agreed to services, and gave verbal consent: 1. CCM service includes personalized support from designated clinical staff supervised by the primary care provider, including individualized plan of care and coordination with other care providers 2. 24/7 contact phone numbers for assistance for urgent and routine care needs. 3. Service will only be billed when office clinical staff spend 20 minutes or more in a month to coordinate care. 4. Only one practitioner may furnish and bill the service in a calendar month. 5.The patient may stop CCM services at any time (effective at the end of the month) by phone call to the office staff. 6. The patient will be responsible for cost sharing (co-pay) of up to 20% of the service fee (after annual deductible is met). Patient agreed to services and consent obtained.  Patient Care Team: Marin Olp, MD as PCP - General (Family Medicine) Paula Compton, MD as Consulting Physician (Obstetrics and Gynecology) Evelina Bucy, DPM as Consulting Physician (Podiatry) Garvin Fila, MD as Consulting Physician (Neurology) [facility], [facility] (Ophthalmology) Madelin Rear, [facility] as Pharmacist (Pharmacist)  Recent office visits:  07/20/2020 OV PCP Garret Reddish, MD; Rx prednisone for acute low back pain, may consider PT in future if pain persists.  05/24/2020 OV PCP  Garret Reddish, MD; Rosuvastatin 40Mgincreased from atorvastatin 40 mg with goal to get LDL under 70, Plavix 75 mg. No aspirin  Recent consult visits:  06/02/2020 OV (dermatoloy) Danella Sensing, MD; no further information available.  04/15/2020 OV (podiatry) Hardie Pulley, DPM; no medication changes indicated.  Hospital visits:  None in previous 6 months  Objective:  Lab Results  Component Value Date   CREATININE 0.67 05/24/2020   CREATININE 0.67 12/25/2019   CREATININE 0.63 11/14/2019    Lab Results  Component Value Date   HGBA1C 5.5 02/27/2015   Last diabetic Eye exam: No results found for: HMDIABEYEEXA  Last diabetic Foot exam: No results found for: HMDIABFOOTEX      Component Value Date/Time   CHOL 121 05/24/2020 1511   TRIG 197.0 (H) 05/24/2020 1511   HDL 44.50 05/24/2020 1511   CHOLHDL 3 05/24/2020 1511   VLDL 39.4 05/24/2020 1511   LDLCALC 37 05/24/2020 1511   LDLCALC 74 03/01/2018 1529   LDLDIRECT 75 11/14/2019 1528    Hepatic Function Latest Ref Rng & Units 05/24/2020 12/25/2019 11/14/2019  Total Protein 6.0 - 8.3 g/dL 6.7 6.3 6.9  Albumin 3.5 - 5.2 g/dL 4.0 - -  AST 0 - 37 U/L 21 25 21   ALT 0 - 35 U/L 24 35(H) 27  Alk Phosphatase 39 - 117 U/L 98 - -  Total Bilirubin 0.2 - 1.2 mg/dL 0.4 0.3 0.5  Bilirubin, Direct <=0.2 mg/dL - - -    Lab Results  Component Value Date/Time   TSH 0.694 10/27/2014 11:44 AM   TSH 0.49 05/31/2012 09:26 AM    CBC Latest Ref Rng & Units 05/24/2020 12/25/2019 11/14/2019  WBC 4.0 - 10.5 K/uL 5.3 4.8 5.1  Hemoglobin 12.0 - 15.0 g/dL 14.2 14.4 15.0  Hematocrit 36.0 - 46.0 % 42.4 43.5 44.2  Platelets 150.0 - 400.0 K/uL 270.0 284 291    Lab Results  Component Value Date/Time   VD25OH 41.65 05/06/2019 03:41 PM   Clinical ASCVD: Yes  The ASCVD Risk score Mikey Bussing DC Jr., et al., 2013) failed to calculate for the following reasons:   The patient has a prior MI or stroke diagnosis     Social History   Tobacco Use  Smoking  Status Former Smoker  . Packs/day: 2.00  . Years: 30.00  . Pack years: 60.00  . Types: Cigarettes  . Quit date: 03/17/2003  . Years since quitting: 17.4  Smokeless Tobacco Never Used   BP Readings from Last 3 Encounters:  07/20/20 126/72  06/11/20 120/80  05/24/20 118/72   Pulse Readings from Last 3 Encounters:  07/20/20 72  06/11/20 80  05/24/20 62   Wt Readings from Last 3 Encounters:  07/20/20 147 lb (66.7 kg)  06/11/20 145 lb (65.8 kg)  05/24/20 143 lb (64.9 kg)    Assessment: Review of patient past medical history, allergies, medications, health status, including review of consultants reports, laboratory and other test data, was performed as part of comprehensive evaluation and provision of chronic care management services.   SDOH:  (Social Determinants of Health) assessments and interventions performed: Yes   CCM Care Plan  Allergies  Allergen Reactions  . Citalopram Hydrobromide     REACTION: blurred vision  . Codeine Phosphate     Liquid formulation only bothers her; tolerates percocet fine.  REACTION: nausea,vomiting  . Erythromycin     Other reaction(s): Unknown  . Nitrofurantoin     REACTION: itching  . Nsaids Other (See Comments)    Elevated liver panel  . Sulfamethoxazole     Other reaction(s): Unknown  . Sulfamethoxazole-Trimethoprim     Bactrim/REACTION: itching    Medications Reviewed Today    Reviewed by Madelin Rear, [facility] (Pharmacist) on 08/25/20 at 1506  Med List Status: <None>  Medication Order Taking? Sig Documenting Provider Last Dose Status Informant  azelastine (ASTELIN) 0.1 % nasal spray [id number] Yes INSTILL 1 SPRAY IN EACH NOSTRIL TWICE A DAY [provider] Taking Active   cholecalciferol (VITAMIN D) 1000 units tablet [id number] Yes Take 2,000 Units by mouth daily.  [provider] Taking Active Spouse/Significant Other           Med Note Kevan Ny   Tue Nov 05, 2018  2:48 PM) Pt currently takes 2000 IU  daily  clopidogrel (PLAVIX) 75 MG tablet [id number] Yes TAKE 1 TABLET BY MOUTH EVERY DAY Marin Olp, MD Taking Active   COVID-19 mRNA vaccine, Pfizer, 30 MCG/0.3ML injection [id number] Yes INJECT AS DIRECTED Carlyle Basques, MD Taking Active   escitalopram (LEXAPRO) 10 MG tablet [id number] Yes Take 1.5 tablets (15 mg total) by mouth daily. Patient takes 1.5 tablets daily Marin Olp, MD Taking Active   hydrochlorothiazide (HYDRODIURIL) 25 MG tablet [id number] Yes TAKE 1/2 TABLET BY MOUTH EVERY DAY Marin Olp, MD Taking Active   ipratropium (ATROVENT) 0.06 % nasal spray [id number] Yes INSTILL 2 SPRAYS IN EACH NOSTRIL 3 TIMES DAILY AS NEEDED [provider] Taking Active   levocetirizine (XYZAL) 5 MG tablet [id number] Yes Take 5 mg by mouth every morning. [provider] Taking Active   Melatonin 1 MG TABS [id number] Yes Take 5 mg by mouth daily.  [provider] Taking Active   Multiple Vitamin (MULTIVITAMIN) tablet [id number] Yes Take 1 tablet by mouth daily. [provider] Taking Active Self  pantoprazole (PROTONIX) 20 MG tablet [id number] Yes Take 1 tablet (20 mg total) by mouth daily. Marin Olp, MD Taking Active   rosuvastatin (CRESTOR) 40 MG tablet [id number] Yes Take 1 tablet (40 mg total) by mouth daily. Marin Olp, MD Taking Active           Patient Active Problem List   Diagnosis Date Noted  . Fatty liver 05/24/2020  . Depression, major, single episode, moderate ([place]) 11/14/2019  . Alkaline phosphatase elevation 06/16/2019  . Right hemiplegia ([place]) 05/06/2019  . Senile purpura ([place]) 11/05/2018  . Hemiplegia affecting dominant side, 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[place]) 03/01/2018  . Osteopenia 10/02/2017  . Benign paroxysmal positional vertigo 11/13/2016  . Low back pain 09/26/2016  . Dysarthria 03/02/2015  . Former smoker 12/09/2014  . Allergic rhinitis 10/27/2014  . Anxiety state 10/27/2014  . Hyperlipidemia 02/22/2010  . History of  CVA (cerebrovascular accident) 02/22/2010  . Esophageal reflux 12/31/2006  . Insomnia 12/31/2006  . MVP (mitral valve prolapse) 12/10/2006  . Essential hypertension 12/07/2006    Immunization History  Administered Date(s) Administered  . Fluad Quad(high Dose 65+) 03/04/2019, 03/18/2020  . Influenza Split 03/17/2011, 02/09/2012  . Influenza Whole 02/12/2008, 02/22/2010  . Influenza, High Dose Seasonal PF 02/27/2018  . Influenza,inj,Quad PF,6+ Mos 02/10/2013, 03/09/2014, 02/05/2015, 02/25/2016, 02/23/2017  . PFIZER(Purple Top)SARS-COV-2 Vaccination 06/17/2019, 07/08/2019, 03/01/2020  . Pneumococcal Conjugate-13 11/14/2019  . Pneumococcal Polysaccharide-23 11/05/2018  . Tdap 10/27/2014  . Zoster 04/03/2013  . Zoster Recombinat (Shingrix) 09/26/2016, 12/26/2016   Conditions to be addressed/monitored: CVA Hx HLD HTN Osteopenia Depression Insomnia  Care Plan : CCM Pharmacy Care Plan  Updates made by Madelin Rear, [facility] since 08/25/2020 12:00 AM    Problem: HLD HTN CVA Hx Depression Osteopenia   Priority: High    Long-Range Goal: Disease Management   Start Date: 08/25/2020  Expected End Date: 08/25/2021  This Visit's Progress: On track  Priority: High  Note:   Current Barriers:  [name] Maintain HLD control, minimal exercise, carb rich diet  Pharmacist Clinical Goal(s):  [name] Patient will contact provider office for questions/concerns as evidenced notation of same in electronic health record through collaboration with PharmD and provider.   Interventions: . 1:1 collaboration with Marin Olp, MD regarding development and update of comprehensive plan of care as evidenced by provider attestation and co-signature . Inter-disciplinary care team collaboration (see longitudinal plan of care) . Comprehensive medication review performed; medication list updated in electronic medical record . No recommendations/changes  Hypertension (BP goal <130/80) -Controlled -Current  treatment: . Hydrochlorothiazide 25 mg tablet - half tablet (12.5 mg) once daily  -Medications previously tried: n/a  -Current home readings: n/a -Denies hypotensive/hypertensive symptoms -Educated on BP goals and benefits of medications for prevention of heart attack, stroke and kidney damage; Daily salt intake goal < 2300 mg; -Recommended to continue current medication  Hyperlipidemia: (LDL goal < 70) -Controlled  -TG elevation 197 on 05/2020 (111 04/2019) -HTN, CVA Hx; former smoker 60 pack year hx (quit >15 years ago) -Current treatment: . Rosuvastatin 40 mg once daily - -Medications previously tried: atorvastatin 40 mg most recently, simvastatin 20 mg.  -CVA Hx, on Plavix alone.  -Current dietary patterns: frosted mini wheats, coffee. Ham sandwich. Alcohol - does not consume other than occasionally during the weekend.  -Current exercise habits: no formal routine, walking some with husband.  -  Educated on Cholesterol goals;  Exercise goal of 150 minutes per week; -Recommended to continue current medication Agreed on inital exercise goal of walking 2-3x/week; minimize carb rich foods such as bread, rice, crackers   Depression (Goal: ensure safe medication use) -Controlled -Current treatment: . Lexapro 10 mg tablet - take 1.5 tablets (15 mg) once daily -Medications previously tried/failed: Zoloft 100 mg once daily  -Educated on Benefits of medication for symptom control -Recommended to continue current medication Declining any additional support at this time  Osteopenia Goal ensure appropriate supplementation, exercise) -Controlled -Last DEXA Scan: 09/2017 - Osteopenia -VitD 42 (04/2019)  -SSRI PPI use -Patient is not a candidate for pharmacologic treatment -Current treatment  . Calcium - none at present . Vitamin D3 2000 units -Recommend 1200 mg of calcium daily from dietary and supplemental sources. Recommend weight-bearing and muscle strengthening exercises for building  and maintaining bone density.  GERD (Goal: minimize symptoms) Some occasional breakthrough acid reflux, identifies tomato sauce as common trigger, somewhat aggravated by coffee as well.  -Not ideally controlled -B12 605 (11/2019) -Current treatment  . Pantoprazole 20 mg once daily  -Medications previously tried: Cimetidine, famotidine, pantoprazole 40 mg, zantac, lansoprazole   -Recommended to continue current medication, patient will supplement breakthrough reflux with prn Pepcid 10-20 mg once or twice daily as needed  Patient Goals/Self-Care Activities . Patient will:  - take medications as prescribed target a minimum of 150 minutes of moderate intensity exercise weekly  supplement breakthrough reflux with prn Pepcid 10-20 mg once or twice daily   Medication Assistance: None required.  Patient affirms current coverage meets needs. Patient's preferred pharmacy is:  [id number] [place], [place] - [address] [place] [address]Phone: [phone]: [phone] Follow Up:  Patient agrees to Care Plan and Follow-up. Plan: [place] 6 [place]. Pt call in 1 month to review exercise goal of walking 2-3x/week as initial goal, minimizing carb intake including foods like bread/rice/crackers, and acid reflux control (pt now supplementing with 10-20 mg once to twice daily)     Future Appointments  Date Time Provider [place] 11/23/2020  3:00 PM HMarin Olp MD [facility]  03/14/2021  3:30 PM [facility] CCM PHARMACIST [facility]  06/17/2021  2:30 PM [facility] [place] Pharm.D., BCGP Clinical Pharmacist [phone]

## 2020-08-29 ENCOUNTER — Other Ambulatory Visit: Payer: Self-pay | Admitting: Family Medicine

## 2020-09-10 DIAGNOSIS — Z23 Encounter for immunization: Secondary | ICD-10-CM | POA: Diagnosis not present

## 2020-10-04 ENCOUNTER — Telehealth: Payer: Self-pay

## 2020-10-04 NOTE — Chronic Care Management (AMB) (Signed)
    Chronic Care Management Pharmacy Assistant   Name: Aeon Koors  MRN: [id number] DOB: 1951/03/10  Reason for Encounter: General Patient Call   Recent office visits:  No visits noted  Recent consult visits:  No visits noted  Hospital visits:  None in previous 6 months  Medications: Outpatient Encounter Medications as of 10/04/2020  Medication Sig Note  . azelastine (ASTELIN) 0.1 % nasal spray INSTILL 1 SPRAY IN EACH NOSTRIL TWICE A DAY   . cholecalciferol (VITAMIN D) 1000 units tablet Take 2,000 Units by mouth daily.  11/05/2018: Pt currently takes 2000 IU daily  . clopidogrel (PLAVIX) 75 MG tablet TAKE 1 TABLET BY MOUTH EVERY DAY   . COVID-19 mRNA vaccine, Pfizer, 30 MCG/0.3ML injection INJECT AS DIRECTED   . escitalopram (LEXAPRO) 10 MG tablet Take 1.5 tablets (15 mg total) by mouth daily. Patient takes 1.5 tablets daily   . hydrochlorothiazide (HYDRODIURIL) 25 MG tablet TAKE 1/2 TABLET BY MOUTH EVERY DAY   . ipratropium (ATROVENT) 0.06 % nasal spray INSTILL 2 SPRAYS IN EACH NOSTRIL 3 TIMES DAILY AS NEEDED   . levocetirizine (XYZAL) 5 MG tablet Take 5 mg by mouth every morning.   . Melatonin 1 MG TABS Take 5 mg by mouth daily.   . Multiple Vitamin (MULTIVITAMIN) tablet Take 1 tablet by mouth daily.   . pantoprazole (PROTONIX) 20 MG tablet Take 1 tablet (20 mg total) by mouth daily.   . rosuvastatin (CRESTOR) 40 MG tablet Take 1 tablet (40 mg total) by mouth daily.    No facility-administered encounter medications on file as of 10/04/2020.   I spoke with Ms. Maezie today and she has been doing ok. Since her last visit she has had to increase her intake of indigestion medication. She now takes pantoprazole and pecid every night  to combat her acid reflux. She believes that chocolate may be triggering her episodes and prolonging the ones she has. She tested out this theory various times with different kinds of chocolate and has come to the same conclusion. She is aware that  pizza, spaghetti, and chocolate are contributing factors of her reflux. In addition to pecid, Ms Myna has also added calcium 600 mg,  biotin 5000 mcg tabs and biotin 2500 mcg chewables to her daily regimen. A couple months ago she was prescribed prednisone that make her gain 11 pounds of which she is not happy with. She is supposed to maintain a walking regimen, but has been finding it difficult to do so. She has not been getting much activity and stated that the arthritis in her hip and back are the main reasons why she is not motivated to start being active again. I encouraged her to begin working her way up to the goals set by her providers by walking at least once a week to help her body adjust. She agreed to try to work on this.   Star Rating Drugs: Rosuvastatin 40 mg- 90 DS last filled 09/05/20  [facility] CPA, CMA

## 2020-10-12 ENCOUNTER — Encounter: Payer: Self-pay | Admitting: Family Medicine

## 2020-11-23 ENCOUNTER — Encounter: Payer: Self-pay | Admitting: Family Medicine

## 2020-11-23 ENCOUNTER — Other Ambulatory Visit: Payer: Self-pay

## 2020-11-23 ENCOUNTER — Ambulatory Visit (INDEPENDENT_AMBULATORY_CARE_PROVIDER_SITE_OTHER): Payer: Medicare Other | Admitting: Family Medicine

## 2020-11-23 VITALS — BP 111/73 | HR 65 | Temp 98.8°F | Ht 62.0 in | Wt 150.6 lb

## 2020-11-23 DIAGNOSIS — F321 Major depressive disorder, single episode, moderate: Secondary | ICD-10-CM

## 2020-11-23 DIAGNOSIS — Z23 Encounter for immunization: Secondary | ICD-10-CM | POA: Diagnosis not present

## 2020-11-23 DIAGNOSIS — E785 Hyperlipidemia, unspecified: Secondary | ICD-10-CM | POA: Diagnosis not present

## 2020-11-23 DIAGNOSIS — F411 Generalized anxiety disorder: Secondary | ICD-10-CM

## 2020-11-23 DIAGNOSIS — I10 Essential (primary) hypertension: Secondary | ICD-10-CM | POA: Diagnosis not present

## 2020-11-23 DIAGNOSIS — I639 Cerebral infarction, unspecified: Secondary | ICD-10-CM

## 2020-11-23 MED ORDER — PANTOPRAZOLE SODIUM 20 MG PO TBEC: 20.0000 mg | DELAYED_RELEASE_TABLET | Freq: Every day | ORAL | 3 refills | Status: DC

## 2020-11-23 MED ORDER — DESVENLAFAXINE SUCCINATE ER 50 MG PO TB24: 50.0000 mg | ORAL_TABLET | Freq: Every day | ORAL | 5 refills | Status: DC

## 2020-11-23 MED ORDER — ROSUVASTATIN CALCIUM 40 MG PO TABS: 40.0000 mg | ORAL_TABLET | Freq: Every day | ORAL | 3 refills | Status: DC

## 2020-11-23 MED ORDER — HYDROCHLOROTHIAZIDE 25 MG PO TABS: 12.5000 mg | ORAL_TABLET | Freq: Every day | ORAL | 3 refills | Status: DC

## 2020-11-23 MED ORDER — CLOPIDOGREL BISULFATE 75 MG PO TABS: 75.0000 mg | ORAL_TABLET | Freq: Every day | ORAL | 3 refills | Status: DC

## 2020-11-23 NOTE — Addendum Note (Signed)
Addended by: Linton Ham on: 11/23/2020 03:54 PM   Modules accepted: Orders

## 2020-11-23 NOTE — Patient Instructions (Addendum)
Please stop by lab before you go If you have mychart- we will send your results within 3 business days of [place] receiving them.  If you do not have mychart- we will call you about results within 5 business days of [place] receiving them.  *please also note that you will see labs on mychart as soon as they post. I will later go in and write notes on them- will say "notes from Dr. Yong Channel"  Prevnar 20 today.  Poor control of depression- we opted to switch from lexapro 50 mg to pristiq 50 mg with follow up in 6 weeks. Stop lexapro after dose today from this morning- start pristiq tomorrow.   Taking the medicine as directed and not missing any doses is one of the best things you can do to treat your depression/anxiety.  Here are some things to keep in mind:  Side effects (stomach upset, some increased anxiety) may happen before you notice a benefit.  These side effects typically go away over time. Changes to your dose of medicine or a change in medication all together is sometimes necessary Most people need to be on medication at least 6-12 months Many people will notice an improvement within two weeks but the full effect of the medication can take up to 4-6 weeks Stopping the medication when you start feeling better often results in a return of symptoms If you start having thoughts of hurting yourself or others after starting this medicine, call our office immediately at [phone] or seek care through 911.     I am happy to hear that you ar not having anymore back pain! Sorry about the weight gain with prdnisone  In regards to the white spot extending onto you upper lip, please monitor that area. If any new changes occur, please update [place] and let us know. We can refer to oral surgery if you change your mind  Recommended follow up: Return in about 6 months (around 05/26/2021) for follow up- or sooner if needed.

## 2020-11-23 NOTE — Progress Notes (Signed)
Phone [phone] In person visit   Subjective:   Brianna Simon is a 70 y.o. year old very pleasant female patient who presents for/with See problem oriented charting Chief Complaint  Patient presents with   Hyperlipidemia   Hypertension   Depression   Anxiety    This visit occurred during the SARS-CoV-2 public health emergency.  Safety protocols were in place, including screening questions prior to the visit, additional usage of staff PPE, and extensive cleaning of exam room while observing appropriate contact time as indicated for disinfecting solutions.   Past Medical History-  Patient Active Problem List   Diagnosis Date Noted   History of CVA (cerebrovascular accident) 02/22/2010    Priority: High   Fatty liver 05/24/2020    Priority: Medium   Depression, major, single episode, moderate ([place]) 11/14/2019    Priority: Medium   Hemiplegia affecting dominant side, 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[place]) 03/01/2018    Priority: Medium   Osteopenia 10/02/2017    Priority: Medium   Former smoker 12/09/2014    Priority: Medium   Anxiety state 10/27/2014    Priority: Medium   Hyperlipidemia 02/22/2010    Priority: Medium   Essential hypertension 12/07/2006    Priority: Medium   Senile purpura ([place]) 11/05/2018    Priority: Low   Allergic rhinitis 10/27/2014    Priority: Low   Esophageal reflux 12/31/2006    Priority: Low   Insomnia 12/31/2006    Priority: Low   MVP (mitral valve prolapse) 12/10/2006    Priority: Low   Alkaline phosphatase elevation 06/16/2019   Benign paroxysmal positional vertigo 11/13/2016   Low back pain 09/26/2016   Dysarthria 03/02/2015    Medications- reviewed and updated Current Outpatient Medications  Medication Sig Dispense Refill   azelastine (ASTELIN) 0.1 % nasal spray daily.  5   CALCIUM PO Take by mouth.     cholecalciferol (VITAMIN D) 1000 units tablet Take 2,000 Units by mouth daily.      desvenlafaxine (PRISTIQ) 50 MG 24 hr tablet Take 1  tablet (50 mg total) by mouth daily. 30 tablet 5   famotidine (PEPCID) 10 MG tablet Take 10 mg by mouth as needed for heartburn or indigestion.     ipratropium (ATROVENT) 0.06 % nasal spray INSTILL 2 SPRAYS IN EACH NOSTRIL 3 TIMES DAILY AS NEEDED  5   levocetirizine (XYZAL) 5 MG tablet Take 5 mg by mouth every morning.     Melatonin 1 MG TABS Take 5 mg by mouth daily.     Multiple Vitamin (MULTIVITAMIN) tablet Take 1 tablet by mouth daily.     clopidogrel (PLAVIX) 75 MG tablet Take 1 tablet (75 mg total) by mouth daily. 90 tablet 3   hydrochlorothiazide (HYDRODIURIL) 25 MG tablet Take 0.5 tablets (12.5 mg total) by mouth daily. 45 tablet 3   pantoprazole (PROTONIX) 20 MG tablet Take 1 tablet (20 mg total) by mouth daily. 90 tablet 3   rosuvastatin (CRESTOR) 40 MG tablet Take 1 tablet (40 mg total) by mouth daily. 90 tablet 3   No current facility-administered medications for this visit.     Objective:  BP 111/73   Pulse 65   Temp 98.8 F (37.1 C) (Temporal)   Ht 5\' 2"  (1.575 m)   Wt 150 lb 9.6 oz (68.3 kg)   LMP  (LMP Unknown)   SpO2 96%   BMI 27.55 kg/m  Gen: NAD, resting comfortably Small perhaps 25mm extension of skin pigment onto right lower lip CV: RRR no murmurs rubs or  gallops Lungs: CTAB no crackles, wheeze, rhonchi Ext: no edema Skin: warm, dry     Assessment and Plan   #social update- just had 70th birthday and ate at [employer] (was not as good as expected)  # Left-sided Back/Hip/leg Pain S: 07/20/20 visit, patient noted some pain in back left leg- sometime late February. She woke up one morning and her hip was hurting severely bad in the left buttocks and lateral hip. No groin pain was reported. 10/10 pain scale rating- could barely sit on the toilet- would worsen with sitting and could not find a better position. Moving would help minimally. She did take tramadol at night to help her sleep- found this helpful at night for rest. Reported no falls or injuries. No heavy  lifting at the time. She did report some numbness/tingling down the leg.  She was diagnosed as having potential radicular pain as well as trochanteric bursitis and we opted to trial course of prednisone. She initially declined PT referral or SM referral. We opted out of x-rays with no midline pain and no fall/trauma  Today patient reports prednisone resolved with this issue- did not like weight gain on prednisone A/P: back pain and hip pain much better- thrilled other than side effect of weight gain   #History of CVA with residual right hemiplegia (numbness mainly right hand with some trouble picking things up as a result- no weakness reported) with loop recorder in place showing no evidence of atrial fibrillation #hyperlipidemia S: Medication: rosuvastatin 40 mg, Plavix 75 mg. No aspirin Lab Results  Component Value Date   CHOL 121 05/24/2020   HDL 44.50 05/24/2020   LDLCALC 37 05/24/2020   LDLDIRECT 75 11/14/2019   TRIG 197.0 (H) 05/24/2020   CHOLHDL 3 05/24/2020   A/P: strokes history noted- stable hemiplegia- continue statin and plavix For hld- LDL goal under 70- has been at goal- continue rosuvastatin 40mg   # Depression/Anxiety S: Medication:Lexapro 15 mg   She has considered therapy but doesn't think husband would be willing for couples counseling. Continued stress with husband.  Depression screen [facility] 2/9 11/23/2020 06/11/2020 05/24/2020  Decreased Interest 2 0 1  Down, Depressed, Hopeless 2 1 0  PHQ - 2 Score 4 1 1   Altered sleeping 3 - 1  Tired, decreased energy 3 - 1  Change in appetite 1 - 0  Feeling bad or failure about yourself  0 - 0  Trouble concentrating 0 - 0  Moving slowly or fidgety/restless 0 - 3  Suicidal thoughts 0 - 0  PHQ-9 Score 11 - 6  Difficult doing work/chores Not difficult at all - Not difficult at all  A/P: poor control- we opted to switch from lexapro 50 mg to pristiq 50mg  with follow up in 6 weeks. Stop lexapro after dose today from this morning-  start pristiq tomorrow.   #hypertension S: medication: hctz 25 mg - takes half tablet daily Home readings #s: rarely checks - reports good #s BP Readings from Last 3 Encounters:  11/23/20 111/73  07/20/20 126/72  06/11/20 120/80  A/P: Stable. Continue current medications.   # GERD S:Medication: pantoprazole 20 mg daily as significant flare-ups if she misses doses in the past.  -also Pepcid 20 mg twice daily over-the-counter trial failed in the past . Sometimes does add on therapy A/P: remains  well controlled on long term pantoprazole- has failed alternate trials -b12 normal last year    #Right Upper Quadrant Pain- right upper quadrant ultrasound prior visit for potential gallstones-  no gallstones found. Did have some fatty liver but thankfully LFTs had been ok other than hair high alkaline phosphatase. Interestingly no recurrence pf pain since that time- continue to monitor. Focus on healthy eating/regular exercise for fatty liver  #Elevated alkaline phosphatase- work-up reassuring with Dr. Loanne Drilling in endocrinology. Has had extensive blood work. Plan was for 1-year repeat visit with endocrine - last visit was in February  #segment of skin extending up onto right side of lower lip- has seen dermatology and was told to discuss with pcp- they did not think cancerous. I told patient that it appears to be extension of skin- offered referral to oral surgeon for 2nd opinion- she declines for now  #HM_ prevnar 20 today   . Recommended follow up: Return in about 6 months (around 05/26/2021) for follow up- or sooner if needed. Future Appointments  Date Time Provider [place]  03/14/2021  3:30 PM [facility] CCM PHARMACIST [facility]  06/17/2021  2:30 PM [facility] HEALTH COACH [facility]    Lab/Order associations:   ICD-10-CM   1. Hyperlipidemia, unspecified hyperlipidemia type  E78.5     2. Essential hypertension  I10 Comprehensive metabolic panel    3. Depression, major, single  episode, moderate (HCC)  F32.1     4. Anxiety state  F41.1     5. Cryptogenic stroke (HCC)  I63.9 clopidogrel (PLAVIX) 75 MG tablet       Meds ordered this encounter  Medications   desvenlafaxine (PRISTIQ) 50 MG 24 hr tablet    Sig: Take 1 tablet (50 mg total) by mouth daily.    Dispense:  30 tablet    Refill:  5   clopidogrel (PLAVIX) 75 MG tablet    Sig: Take 1 tablet (75 mg total) by mouth daily.    Dispense:  90 tablet    Refill:  3   hydrochlorothiazide (HYDRODIURIL) 25 MG tablet    Sig: Take 0.5 tablets (12.5 mg total) by mouth daily.    Dispense:  45 tablet    Refill:  3   pantoprazole (PROTONIX) 20 MG tablet    Sig: Take 1 tablet (20 mg total) by mouth daily.    Dispense:  90 tablet    Refill:  3   rosuvastatin (CRESTOR) 40 MG tablet    Sig: Take 1 tablet (40 mg total) by mouth daily.    Dispense:  90 tablet    Refill:  3   I,Harris Phan,acting as a scribe for Garret Reddish, MD.,have documented all relevant documentation on the behalf of Garret Reddish, MD,as directed by  Garret Reddish, MD while in the presence of Garret Reddish, MD.  I, Garret Reddish, MD, have reviewed all documentation for this visit. The documentation on 11/23/20 for the exam, diagnosis, procedures, and orders are all accurate and complete.  Return precautions advised.  Garret Reddish, MD

## 2020-11-24 LAB — COMPREHENSIVE METABOLIC PANEL
ALT: 22 U/L (ref 0–35)
AST: 20 U/L (ref 0–37)
Albumin: 4.1 g/dL (ref 3.5–5.2)
Alkaline Phosphatase: 95 U/L (ref 39–117)
BUN: 14 mg/dL (ref 6–23)
CO2: 31 mEq/L (ref 19–32)
Calcium: 9.5 mg/dL (ref 8.4–10.5)
Chloride: 104 mEq/L (ref 96–112)
Creatinine, Ser: 0.65 mg/dL (ref 0.40–1.20)
GFR: 90.72 mL/min (ref >60.00)
Glucose, Bld: 79 mg/dL (ref 70–99)
Potassium: 4.2 mEq/L (ref 3.5–5.1)
Sodium: 141 mEq/L (ref 135–145)
Total Bilirubin: 0.4 mg/dL (ref 0.2–1.2)
Total Protein: 6.8 g/dL (ref 6.0–8.3)

## 2020-12-14 DIAGNOSIS — Z20822 Contact with and (suspected) exposure to covid-19: Secondary | ICD-10-CM | POA: Diagnosis not present

## 2020-12-15 ENCOUNTER — Other Ambulatory Visit: Payer: Self-pay | Admitting: Family Medicine

## 2020-12-16 ENCOUNTER — Encounter: Payer: Self-pay | Admitting: Family Medicine

## 2020-12-16 ENCOUNTER — Telehealth (INDEPENDENT_AMBULATORY_CARE_PROVIDER_SITE_OTHER): Payer: Medicare Other | Admitting: Family Medicine

## 2020-12-16 VITALS — BP 120/60 | HR 86 | Temp 98.1°F | Ht 62.0 in | Wt 144.0 lb

## 2020-12-16 DIAGNOSIS — Z8673 Personal history of transient ischemic attack (TIA), and cerebral infarction without residual deficits: Secondary | ICD-10-CM | POA: Diagnosis not present

## 2020-12-16 DIAGNOSIS — U071 COVID-19: Secondary | ICD-10-CM | POA: Diagnosis not present

## 2020-12-16 DIAGNOSIS — I10 Essential (primary) hypertension: Secondary | ICD-10-CM

## 2020-12-16 DIAGNOSIS — I639 Cerebral infarction, unspecified: Secondary | ICD-10-CM

## 2020-12-16 DIAGNOSIS — E785 Hyperlipidemia, unspecified: Secondary | ICD-10-CM | POA: Diagnosis not present

## 2020-12-16 MED ORDER — MOLNUPIRAVIR EUA 200MG CAPSULE: 4.0000 | ORAL_CAPSULE | Freq: Two times a day (BID) | ORAL | 0 refills | Status: AC

## 2020-12-16 NOTE — Progress Notes (Signed)
Phone [phone] Virtual visit via Video note   Subjective:  Chief complaint: Chief Complaint  Patient presents with   Covid Positive    Patient wants counseling on the medication that's out for covid and she would like you to prescribe her some, patient states that she cant smell, or taste, runny nose, low grade fever , sneezing and coughing, light headed and weak.    This visit type was conducted due to national recommendations for restrictions regarding the COVID-19 Pandemic (e.g. social distancing).  This format is felt to be most appropriate for this patient at this time balancing risks to patient and risks to population by having him in for in person visit.  No physical exam was performed (except for noted visual exam or audio findings with Telehealth visits).    Our team/I connected with Brianna Simon at  5:00 PM EDT by a video enabled telemedicine application ([email or web address] or caregility through epic) and verified that I am speaking with the correct person using two identifiers.  Location patient: [place] Location provider: [facility], office Persons participating in the virtual visit:  patient  Our team/I discussed the limitations of evaluation and management by telemedicine and the availability of in person appointments. In light of current covid-19 pandemic, patient also understands that we are trying to protect them by minimizing in office contact if at all possible.  The patient expressed consent for telemedicine visit and agreed to proceed. Patient understands insurance will be billed.   Past Medical History-  Patient Active Problem List   Diagnosis Date Noted   History of CVA (cerebrovascular accident) 02/22/2010    Priority: High   Fatty liver 05/24/2020    Priority: Medium   Depression, major, single episode, moderate ([place]) 11/14/2019    Priority: Medium   Hemiplegia affecting dominant side, 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[place]) 03/01/2018    Priority: Medium   Osteopenia  10/02/2017    Priority: Medium   Former smoker 12/09/2014    Priority: Medium   Anxiety state 10/27/2014    Priority: Medium   Hyperlipidemia 02/22/2010    Priority: Medium   Essential hypertension 12/07/2006    Priority: Medium   Senile purpura ([place]) 11/05/2018    Priority: Low   Allergic rhinitis 10/27/2014    Priority: Low   Esophageal reflux 12/31/2006    Priority: Low   Insomnia 12/31/2006    Priority: Low   MVP (mitral valve prolapse) 12/10/2006    Priority: Low   Alkaline phosphatase elevation 06/16/2019   Benign paroxysmal positional vertigo 11/13/2016   Low back pain 09/26/2016   Dysarthria 03/02/2015    Medications- reviewed and updated Current Outpatient Medications  Medication Sig Dispense Refill   azelastine (ASTELIN) 0.1 % nasal spray daily.  5   CALCIUM PO Take by mouth.     cholecalciferol (VITAMIN D) 1000 units tablet Take 2,000 Units by mouth daily.      clopidogrel (PLAVIX) 75 MG tablet Take 1 tablet (75 mg total) by mouth daily. 90 tablet 3   desvenlafaxine (PRISTIQ) 50 MG 24 hr tablet TAKE 1 TABLET BY MOUTH EVERY DAY 90 tablet 2   famotidine (PEPCID) 10 MG tablet Take 10 mg by mouth as needed for heartburn or indigestion.     hydrochlorothiazide (HYDRODIURIL) 25 MG tablet Take 0.5 tablets (12.5 mg total) by mouth daily. 45 tablet 3   ipratropium (ATROVENT) 0.06 % nasal spray INSTILL 2 SPRAYS IN EACH NOSTRIL 3 TIMES DAILY AS NEEDED  5   levocetirizine (XYZAL) 5 MG tablet  Take 5 mg by mouth every morning.     Melatonin 1 MG TABS Take 5 mg by mouth daily.     molnupiravir EUA 200 mg CAPS Take 4 capsules (800 mg total) by mouth 2 (two) times daily for 5 days. 40 capsule 0   Multiple Vitamin (MULTIVITAMIN) tablet Take 1 tablet by mouth daily.     pantoprazole (PROTONIX) 20 MG tablet Take 1 tablet (20 mg total) by mouth daily. 90 tablet 3   rosuvastatin (CRESTOR) 40 MG tablet Take 1 tablet (40 mg total) by mouth daily. 90 tablet 3   No current  facility-administered medications for this visit.     Objective:  BP 120/60   Pulse 86   Temp 98.1 F (36.7 C)   Ht '5\' 2"'$  (1.575 m)   Wt 144 lb (65.3 kg)   LMP  (LMP Unknown)   BMI 26.34 kg/m  self reported vitals Gen: NAD, resting comfortably Lungs: nonlabored, normal respiratory rate  Skin: appears dry, no obvious rash     Assessment and Plan   # [id number] positive S:patient started feeling poorly on Monday up to 100.5- also runny nose, low energy, low taste, lack of appetite. Husband noted some runny nose around the same time on Monday. Tuesday she felt 10% better. Tuesday got tested and test came back today as positive for covid 19 (both she and husband). Had taken antipyretics for fever (would prefer tylenol over ibuprofen due to clopidogrel). Much improved today- Last day of fever 12/15/20. Never had shortness of breath A/P:  Patient with testing confirming covid 19 with first day of covid 19 symptoms 12/13/20 Vaccination status:patient has had 4 moderna covid vaccinations with most recent 09/10/20  Therefore: - recommended patient watch closely for shortness of breath or confusion or worsening symptoms and if those occur patient should contact us immediately or seek care in the emergency department -recommended patient consider purchasing pulse oximeter and if levels 94% or below persistently- seek care at the hospital - Patient needs to self isolate  for at least 5 days since first symptom AND at least 24 hours fever free without fever reducing medications AND have improvement in respiratory symptoms . After 5 days can end self isolation but still needs to wear mask for additional 5 days .  -Patient should inform close contacts about exposure (anyone patient been around unmasked for more than 15 minutes)   If High risk for complications-we discussed outpatient therapeutic options including paxlovid (and risk of rebound), molnupiravir (risk of rebound as well), MAB infusion -  patient opted for  molnupiravir due to potential reduced efficacy of clopidogrel as well as having to stop statin and patient with stroke history  #History of CVA with residual right hemiplegia (numbness mainly right hand with some trouble picking things up as a result- no weakness reported) with loop recorder in place showing no evidence of atrial fibrillation #hyperlipidemia S: Medication:: rosuvastatin 40 mg, Plavix 75 mg. No aspirin  Lab Results  Component Value Date   CHOL 121 05/24/2020   HDL 44.50 05/24/2020   LDLCALC 37 05/24/2020   LDLDIRECT 75 11/14/2019   TRIG 197.0 (H) 05/24/2020   CHOLHDL 3 05/24/2020   A/P: As above with stroke history we did not feel strongly about using paxlovid and having to stop rosuvastatin and have reduced efficacy of Plavix-we opted to continue current medication and use molnupiravir instead  #hypertension S: medication: hctz 25 mg - takes half tablet daily Home readings #s: rarely checks -but  did check for yesterday BP Readings from Last 3 Encounters:  12/16/20 120/60  11/23/20 111/73  07/20/20 126/72  A/P: Well-controlled-continue current medication  Recommended follow up: As needed for new or worsening symptoms Future Appointments  Date Time Provider [place]  01/04/2021  2:20 PM Marin Olp, MD [facility]  03/14/2021  3:30 PM [facility] CCM PHARMACIST [facility]  06/17/2021  2:30 PM [facility] HEALTH COACH [facility]    Lab/Order associations:   ICD-10-CM   1. COVID-19  U07.1     2. Hyperlipidemia, unspecified hyperlipidemia type  E78.5     3. Essential hypertension  I10     4. History of CVA (cerebrovascular accident)  Z72.73       Meds ordered this encounter  Medications   molnupiravir EUA 200 mg CAPS    Sig: Take 4 capsules (800 mg total) by mouth 2 (two) times daily for 5 days.    Dispense:  40 capsule    Refill:  0   Return precautions advised.  Garret Reddish, MD

## 2020-12-16 NOTE — Patient Instructions (Addendum)
Health Maintenance Due  Topic Date Due   INFLUENZA VACCINE Will discuss at next in office visit.  12/13/2020    Recommended follow up: No follow-ups on file.

## 2020-12-31 DIAGNOSIS — Z20822 Contact with and (suspected) exposure to covid-19: Secondary | ICD-10-CM | POA: Diagnosis not present

## 2021-01-04 ENCOUNTER — Ambulatory Visit (INDEPENDENT_AMBULATORY_CARE_PROVIDER_SITE_OTHER): Payer: Medicare Other | Admitting: Family Medicine

## 2021-01-04 ENCOUNTER — Encounter: Payer: Self-pay | Admitting: Family Medicine

## 2021-01-04 ENCOUNTER — Other Ambulatory Visit: Payer: Self-pay

## 2021-01-04 VITALS — BP 126/80 | HR 78 | Temp 98.2°F | Ht 62.0 in | Wt 149.2 lb

## 2021-01-04 DIAGNOSIS — F3342 Major depressive disorder, recurrent, in full remission: Secondary | ICD-10-CM | POA: Diagnosis not present

## 2021-01-04 DIAGNOSIS — I10 Essential (primary) hypertension: Secondary | ICD-10-CM

## 2021-01-04 DIAGNOSIS — I639 Cerebral infarction, unspecified: Secondary | ICD-10-CM

## 2021-01-04 NOTE — Patient Instructions (Addendum)
Health Maintenance Due  Topic Date Due   INFLUENZA VACCINE Not available in office YET, Patient will get her immunization as soon as it becomes available. Please consider getting your flu shot in the Fall. If you get this outside of our office, please let us know.  12/13/2020   I am glad to see you are continuing to do well with your anxiety and depression! Please continue your current medications - let us know if you have any new or recurring symptoms of depression.  Recommended follow up: Return in about 6 months (around 07/07/2021) for a follow-up or sooner if needed.

## 2021-01-04 NOTE — Progress Notes (Signed)
Phone [phone] In person visit   Subjective:   Brianna Simon is a 70 y.o. year old very pleasant female patient who presents for/with See problem oriented charting Chief Complaint  Patient presents with   Depression   Hyperlipidemia   Gastroesophageal Reflux    This visit occurred during the SARS-CoV-2 public health emergency.  Safety protocols were in place, including screening questions prior to the visit, additional usage of staff PPE, and extensive cleaning of exam room while observing appropriate contact time as indicated for disinfecting solutions.   Past Medical History-  Patient Active Problem List   Diagnosis Date Noted   History of CVA (cerebrovascular accident) 02/22/2010    Priority: High   Fatty liver 05/24/2020    Priority: Medium   Depression, major, single episode, moderate ([place]) 11/14/2019    Priority: Medium   Hemiplegia affecting dominant side, 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[place]) 03/01/2018    Priority: Medium   Osteopenia 10/02/2017    Priority: Medium   Former smoker 12/09/2014    Priority: Medium   Anxiety state 10/27/2014    Priority: Medium   Hyperlipidemia 02/22/2010    Priority: Medium   Essential hypertension 12/07/2006    Priority: Medium   Senile purpura ([place]) 11/05/2018    Priority: Low   Allergic rhinitis 10/27/2014    Priority: Low   Esophageal reflux 12/31/2006    Priority: Low   Insomnia 12/31/2006    Priority: Low   MVP (mitral valve prolapse) 12/10/2006    Priority: Low   Alkaline phosphatase elevation 06/16/2019   Benign paroxysmal positional vertigo 11/13/2016   Low back pain 09/26/2016   Dysarthria 03/02/2015    Medications- reviewed and updated Current Outpatient Medications  Medication Sig Dispense Refill   azelastine (ASTELIN) 0.1 % nasal spray daily.  5   CALCIUM PO Take by mouth.     cholecalciferol (VITAMIN D) 1000 units tablet Take 2,000 Units by mouth daily.      clopidogrel (PLAVIX) 75 MG tablet Take 1 tablet (75  mg total) by mouth daily. 90 tablet 3   desvenlafaxine (PRISTIQ) 50 MG 24 hr tablet TAKE 1 TABLET BY MOUTH EVERY DAY 90 tablet 2   famotidine (PEPCID) 10 MG tablet Take 10 mg by mouth as needed for heartburn or indigestion.     hydrochlorothiazide (HYDRODIURIL) 25 MG tablet Take 0.5 tablets (12.5 mg total) by mouth daily. 45 tablet 3   ipratropium (ATROVENT) 0.06 % nasal spray INSTILL 2 SPRAYS IN EACH NOSTRIL 3 TIMES DAILY AS NEEDED  5   levocetirizine (XYZAL) 5 MG tablet Take 5 mg by mouth every morning.     Melatonin 1 MG TABS Take 5 mg by mouth daily.     Multiple Vitamin (MULTIVITAMIN) tablet Take 1 tablet by mouth daily.     pantoprazole (PROTONIX) 20 MG tablet Take 1 tablet (20 mg total) by mouth daily. 90 tablet 3   rosuvastatin (CRESTOR) 40 MG tablet Take 1 tablet (40 mg total) by mouth daily. 90 tablet 3   No current facility-administered medications for this visit.     Objective:  BP 126/80   Pulse 78   Temp 98.2 F (36.8 C) (Oral)   Ht '5\' 2"'$  (1.575 m)   Wt 149 lb 3.2 oz (67.7 kg)   LMP  (LMP Unknown)   SpO2 96%   BMI 27.29 kg/m  Gen: NAD, resting comfortably CV: RRR no murmurs rubs or gallops Lungs: CTAB no crackles, wheeze, rhonchi Ext: no edema Skin: warm, dry  Assessment and Plan   # Depression/Anxiety S: Medication:pristiq 50 mg trial- prior on Lexapro 15 mg  -still significant stressors with husband but handling these much better - covid was very hard on her for 3 weeks- now starting to recover. Some of her scores on phq9 for fatigue are affected by this as well as questions 1 and 2.  A/P: much improved control with phq9 down from 11 to 5- some of the symptoms listed are related to recent covid and recovering from that anyway- we will continue current meds and consider this full remission of major depression. Anxiety better as well  #hypertension S: medication: hctz 25 mg - takes half tablet daily Home readings #s: rarely checks  BP Readings from Last 3  Encounters:  01/04/21 126/80  12/16/20 120/60  11/23/20 111/73  A/P: Stable. Continue current medications.   Recommended follow up: Return in about 6 months (around 07/07/2021) for a follow-up or sooner if needed. Future Appointments  Date Time Provider [place]  03/14/2021  3:30 PM [facility] CCM PHARMACIST [facility]  06/17/2021  2:30 PM [facility] HEALTH COACH [facility]   Lab/Order associations:   ICD-10-CM   1. Recurrent major depressive disorder, in full remission ([place])  F33.42     2. Essential hypertension  I10      I,Harris Phan,acting as a scribe for Garret Reddish, MD.,have documented all relevant documentation on the behalf of Garret Reddish, MD,as directed by  Garret Reddish, MD while in the presence of Garret Reddish, MD.  I, Garret Reddish, MD, have reviewed all documentation for this visit. The documentation on 01/04/21 for the exam, diagnosis, procedures, and orders are all accurate and complete.   Return precautions advised.  Garret Reddish, MD

## 2021-01-06 ENCOUNTER — Telehealth: Payer: Self-pay | Admitting: Pharmacist

## 2021-01-06 NOTE — Chronic Care Management (AMB) (Addendum)
    Chronic Care Management Pharmacy Assistant   Name: Brianna Simon  MRN: [id number] DOB: [birth year]   Reason for Encounter: General Adherence Call    Recent office visits:  01/04/2021 Wonda Cerise, MD; chronic follow up, no medication changes indicated.  12/14/2020 OV Bates, Crystal, contact with and suspected covid-19. No further information available.  Recent consult visits:  None  Hospital visits:  None in previous 6 months  Medications: Outpatient Encounter Medications as of 01/06/2021  Medication Sig Note   azelastine (ASTELIN) 0.1 % nasal spray daily.    CALCIUM PO Take by mouth.    cholecalciferol (VITAMIN D) 1000 units tablet Take 2,000 Units by mouth daily.  11/05/2018: Pt currently takes 2000 IU daily   clopidogrel (PLAVIX) 75 MG tablet Take 1 tablet (75 mg total) by mouth daily.    desvenlafaxine (PRISTIQ) 50 MG 24 hr tablet TAKE 1 TABLET BY MOUTH EVERY DAY    famotidine (PEPCID) 10 MG tablet Take 10 mg by mouth as needed for heartburn or indigestion.    hydrochlorothiazide (HYDRODIURIL) 25 MG tablet Take 0.5 tablets (12.5 mg total) by mouth daily.    ipratropium (ATROVENT) 0.06 % nasal spray INSTILL 2 SPRAYS IN EACH NOSTRIL 3 TIMES DAILY AS NEEDED    levocetirizine (XYZAL) 5 MG tablet Take 5 mg by mouth every morning.    Melatonin 1 MG TABS Take 5 mg by mouth daily.    Multiple Vitamin (MULTIVITAMIN) tablet Take 1 tablet by mouth daily.    pantoprazole (PROTONIX) 20 MG tablet Take 1 tablet (20 mg total) by mouth daily.    rosuvastatin (CRESTOR) 40 MG tablet Take 1 tablet (40 mg total) by mouth daily.    No facility-administered encounter medications on file as of 01/06/2021.    Patient Questions: Have you had any problems recently with your health? Patient states she had covid at the beginning of August. She states she recovered well but still has some lingering fatigue.  Have you had any problems with your pharmacy? Patient states she has not  had any problems recently with her pharmacy.  What issues or side effects are you having with your medications? Patient states she is not currently having any issues or side effects with any of his medications.  What would you like me to pass along to Leata Mouse, CPP for him to help you with?  Patient states she does not have anything to pass along at this time.  What can we do to take care of you better? Patient did not have any suggestions.  Future Appointments  Date Time Provider [place]  03/14/2021  3:30 PM [facility] CCM PHARMACIST [facility]  06/17/2021  2:30 PM [facility] HEALTH COACH [facility]  07/12/2021  3:00 PM Marin Olp, MD [facility]     Star Rating Drugs: Rosuvastatin 40 mg last filled 12/13/2020 90 DS  April D Calhoun, [place] Pharmacist Assistant [phone]   10 minutes spent in review, coordination, and documentation.  Reviewed by: Beverly Milch, PharmD Clinical Pharmacist [phone]

## 2021-01-14 ENCOUNTER — Other Ambulatory Visit: Payer: Self-pay | Admitting: Obstetrics and Gynecology

## 2021-01-14 ENCOUNTER — Telehealth: Payer: Self-pay

## 2021-01-14 DIAGNOSIS — Z1231 Encounter for screening mammogram for malignant neoplasm of breast: Secondary | ICD-10-CM

## 2021-01-14 NOTE — Telephone Encounter (Signed)
Please see message. °

## 2021-01-14 NOTE — Telephone Encounter (Signed)
Pt called stating that she had covid last month. Pt wants to know if Dr Yong Channel think it would be a good idea for her to get another covid booster. Kaizleigh stated that she has already received 4 covid vaccines. Please Advise.

## 2021-01-14 NOTE — Telephone Encounter (Signed)
No indication for 5th vaccine at this point. If they come out with new variant specific vaccine then possibly

## 2021-01-18 NOTE — Telephone Encounter (Signed)
Called and lm for pt tcb, when pt calls back please give below message.

## 2021-02-17 ENCOUNTER — Other Ambulatory Visit: Payer: Self-pay

## 2021-02-17 ENCOUNTER — Ambulatory Visit
Admission: RE | Admit: 2021-02-17 | Discharge: 2021-02-17 | Disposition: A | Discharge status: Home / Self Care | Payer: Medicare Other | Source: Ambulatory Visit | Attending: Obstetrics and Gynecology | Admitting: Obstetrics and Gynecology

## 2021-02-17 DIAGNOSIS — Z1231 Encounter for screening mammogram for malignant neoplasm of breast: Secondary | ICD-10-CM

## 2021-02-23 DIAGNOSIS — J3089 Other allergic rhinitis: Secondary | ICD-10-CM | POA: Diagnosis not present

## 2021-02-23 DIAGNOSIS — J301 Allergic rhinitis due to pollen: Secondary | ICD-10-CM | POA: Diagnosis not present

## 2021-02-23 DIAGNOSIS — U099 Post covid-19 condition, unspecified: Secondary | ICD-10-CM | POA: Diagnosis not present

## 2021-02-23 DIAGNOSIS — J300 Vasomotor rhinitis: Secondary | ICD-10-CM | POA: Diagnosis not present

## 2021-02-24 ENCOUNTER — Ambulatory Visit (INDEPENDENT_AMBULATORY_CARE_PROVIDER_SITE_OTHER): Payer: Medicare Other

## 2021-02-24 DIAGNOSIS — Z23 Encounter for immunization: Secondary | ICD-10-CM

## 2021-03-01 NOTE — Progress Notes (Signed)
Chronic Care Management Pharmacy Note  03/14/2021 Name:  Brianna Simon MRN:  [id number] DOB:  [birth year]  Subjective: Brianna Simon is an 70 y.o. year old female who is a primary patient of Hunter, Brayton Mars, MD.  The CCM team was consulted for assistance with disease management and care coordination needs.    Engaged with patient by telephone for follow up visit in response to provider referral for pharmacy case management and/or care coordination services.   Consent to Services:  The patient was given the following information about Chronic Care Management services today, agreed to services, and gave verbal consent: 1. CCM service includes personalized support from designated clinical staff supervised by the primary care provider, including individualized plan of care and coordination with other care providers 2. 24/7 contact phone numbers for assistance for urgent and routine care needs. 3. Service will only be billed when office clinical staff spend 20 minutes or more in a month to coordinate care. 4. Only one practitioner may furnish and bill the service in a calendar month. 5.The patient may stop CCM services at any time (effective at the end of the month) by phone call to the office staff. 6. The patient will be responsible for cost sharing (co-pay) of up to 20% of the service fee (after annual deductible is met). Patient agreed to services and consent obtained.  Patient Care Team: Marin Olp, MD as PCP - General (Family Medicine) Paula Compton, MD as Consulting Physician (Obstetrics and Gynecology) Evelina Bucy, DPM as Consulting Physician (Podiatry) Garvin Fila, MD as Consulting Physician (Neurology) [facility], [facility] (Ophthalmology) Edythe Clarity, [facility] (Pharmacist)  Recent office visits:  07/20/2020 OV PCP Garret Reddish, MD; Rx prednisone for acute low back pain, may consider PT in future if pain persists.   05/24/2020 OV PCP  Garret Reddish, MD; Rosuvastatin 40Mg increased from atorvastatin 40 mg with goal to get LDL under 70, Plavix 75 mg. No aspirin   Recent consult visits:  06/02/2020 OV (dermatoloy) Danella Sensing, MD; no further information available.   04/15/2020 OV (podiatry) Hardie Pulley, DPM; no medication changes indicated.   Hospital visits:  None in previous 6 months  Objective:  Lab Results  Component Value Date   CREATININE 0.65 11/23/2020   CREATININE 0.67 05/24/2020   CREATININE 0.67 12/25/2019    Lab Results  Component Value Date   HGBA1C 5.5 02/27/2015   Last diabetic Eye exam: No results found for: HMDIABEYEEXA  Last diabetic Foot exam: No results found for: HMDIABFOOTEX      Component Value Date/Time   CHOL 121 05/24/2020 1511   TRIG 197.0 (H) 05/24/2020 1511   HDL 44.50 05/24/2020 1511   CHOLHDL 3 05/24/2020 1511   VLDL 39.4 05/24/2020 1511   LDLCALC 37 05/24/2020 1511   LDLCALC 74 03/01/2018 1529   LDLDIRECT 75 11/14/2019 1528    Hepatic Function Latest Ref Rng & Units 11/23/2020 05/24/2020 12/25/2019  Total Protein 6.0 - 8.3 g/dL 6.8 6.7 6.3  Albumin 3.5 - 5.2 g/dL 4.1 4.0 -  AST 0 - 37 U/L 20 21 25   ALT 0 - 35 U/L 22 24 35(H)  Alk Phosphatase 39 - 117 U/L 95 98 -  Total Bilirubin 0.2 - 1.2 mg/dL 0.4 0.4 0.3  Bilirubin, Direct <=0.2 mg/dL - - -    Lab Results  Component Value Date/Time   TSH 0.694 10/27/2014 11:44 AM   TSH 0.49 05/31/2012 09:26 AM    CBC Latest Ref Rng &  Units 05/24/2020 12/25/2019 11/14/2019  WBC 4.0 - 10.5 K/uL 5.3 4.8 5.1  Hemoglobin 12.0 - 15.0 g/dL 14.2 14.4 15.0  Hematocrit 36.0 - 46.0 % 42.4 43.5 44.2  Platelets 150.0 - 400.0 K/uL 270.0 284 291    Lab Results  Component Value Date/Time   VD25OH 41.65 05/06/2019 03:41 PM   Clinical ASCVD: Yes  The ASCVD Risk score (Arnett DK, et al., 2019) failed to calculate for the following reasons:   The patient has a prior MI or stroke diagnosis     Social History   Tobacco Use  Smoking  Status Former   Packs/day: 2.00   Years: 30.00   Pack years: 60.00   Types: Cigarettes   Quit date: 03/17/2003   Years since quitting: 18.0  Smokeless Tobacco Never   BP Readings from Last 3 Encounters:  01/04/21 126/80  12/16/20 120/60  11/23/20 111/73   Pulse Readings from Last 3 Encounters:  01/04/21 78  12/16/20 86  11/23/20 65   Wt Readings from Last 3 Encounters:  01/04/21 149 lb 3.2 oz (67.7 kg)  12/16/20 144 lb (65.3 kg)  11/23/20 150 lb 9.6 oz (68.3 kg)    Assessment: Review of patient past medical history, allergies, medications, health status, including review of consultants reports, laboratory and other test data, was performed as part of comprehensive evaluation and provision of chronic care management services.   SDOH:  (Social Determinants of Health) assessments and interventions performed: Yes   CCM Care Plan  Allergies  Allergen Reactions   Citalopram Hydrobromide     REACTION: blurred vision   Codeine Phosphate     Liquid formulation only bothers her; tolerates percocet fine.  REACTION: nausea,vomiting   Erythromycin     Other reaction(s): Unknown   Nitrofurantoin     REACTION: itching   Nsaids Other (See Comments)    Elevated liver panel   Sulfamethoxazole     Other reaction(s): Unknown   Sulfamethoxazole-Trimethoprim     Bactrim/REACTION: itching    Medications Reviewed Today     Reviewed by Edythe Clarity, [facility] (Pharmacist) on 03/14/21 at 1546  Med List Status: <None>   Medication Order Taking? Sig Documenting Provider Last Dose Status Informant  azelastine (ASTELIN) 0.1 % nasal spray [id number] Yes daily. [provider] Taking Active   CALCIUM PO [id number] Yes Take by mouth. [provider] Taking Active   cholecalciferol (VITAMIN D) 1000 units tablet [id number] Yes Take 2,000 Units by mouth daily.  [provider] Taking Active Spouse/Significant Other           Med Note Morton Amy, Clearnce Sorrel   Tue Nov 05, 2018  2:48 PM) Pt currently takes 2000 IU daily  clopidogrel (PLAVIX) 75 MG tablet [id number] Yes Take 1 tablet (75 mg total) by mouth daily. Marin Olp, MD Taking Active   desvenlafaxine (PRISTIQ) 50 MG 24 hr tablet [id number] Yes TAKE 1 TABLET BY MOUTH EVERY DAY Marin Olp, MD Taking Active   famotidine (PEPCID) 10 MG tablet [id number] Yes Take 10 mg by mouth as needed for heartburn or indigestion. [provider] Taking Active   hydrochlorothiazide (HYDRODIURIL) 25 MG tablet [id number] Yes Take 0.5 tablets (12.5 mg total) by mouth daily. Marin Olp, MD Taking Active   ipratropium (ATROVENT) 0.06 % nasal spray [id number] Yes INSTILL 2 SPRAYS IN EACH NOSTRIL 3 TIMES DAILY AS NEEDED [provider] Taking Active   levocetirizine (XYZAL) 5 MG tablet [id number] Yes Take 5 mg by mouth every  morning. [provider] Taking Active   Melatonin 1 MG TABS [id number] Yes Take 5 mg by mouth daily. [provider] Taking Active   Multiple Vitamin (MULTIVITAMIN) tablet [id number] Yes Take 1 tablet by mouth daily. [provider] Taking Active Self  pantoprazole (PROTONIX) 20 MG tablet [id number] Yes Take 1 tablet (20 mg total) by mouth daily. Marin Olp, MD Taking Active   rosuvastatin (CRESTOR) 40 MG tablet [id number] Yes Take 1 tablet (40 mg total) by mouth daily. Marin Olp, MD Taking Active             Patient Active Problem List   Diagnosis Date Noted   Fatty liver 05/24/2020   Depression, major, single episode, moderate ([place]) 11/14/2019   Alkaline phosphatase elevation 06/16/2019   Senile purpura ([place]) 11/05/2018   Hemiplegia affecting dominant side, 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[place]) 03/01/2018   Osteopenia 10/02/2017   Benign paroxysmal positional vertigo 11/13/2016   Low back pain 09/26/2016   Dysarthria 03/02/2015   Former smoker 12/09/2014   Allergic rhinitis 10/27/2014   Anxiety state 10/27/2014   Hyperlipidemia 02/22/2010    History of CVA (cerebrovascular accident) 02/22/2010   Esophageal reflux 12/31/2006   Insomnia 12/31/2006   MVP (mitral valve prolapse) 12/10/2006   Essential hypertension 12/07/2006    Immunization History  Administered Date(s) Administered   Fluad Quad(high Dose 65+) 03/04/2019, 03/18/2020, 02/24/2021   Influenza Split 03/17/2011, 02/09/2012   Influenza Whole 02/12/2008, 02/22/2010   Influenza, High Dose Seasonal PF 02/27/2018   Influenza,inj,Quad PF,6+ Mos 02/10/2013, 03/09/2014, 02/05/2015, 02/25/2016, 02/23/2017   PFIZER Comirnaty(Gray Top)Covid-19 Tri-Sucrose Vaccine 09/10/2020   PFIZER(Purple Top)SARS-COV-2 Vaccination 06/17/2019, 06/20/2019, 07/08/2019, 07/11/2019, 03/01/2020, 09/10/2020   PNEUMOCOCCAL CONJUGATE-20 11/23/2020   Pneumococcal Conjugate-13 11/14/2019   Pneumococcal Polysaccharide-23 11/05/2018   Tdap 10/27/2014   Zoster Recombinat (Shingrix) 09/26/2016, 12/26/2016   Zoster, Live 04/03/2013   Conditions to be addressed/monitored: CVA Hx HLD HTN Osteopenia Depression Insomnia  Care Plan : CCM Pharmacy Care Plan  Updates made by Edythe Clarity, RPH since 03/14/2021 12:00 AM     Problem: HLD HTN CVA Hx Depression Osteopenia   Priority: High     Long-Range Goal: Disease Management   Start Date: 08/25/2020  Expected End Date: 08/25/2021  Recent Progress: On track  Priority: High  Note:   Current Barriers:  Maintain HLD control, minimal exercise, carb rich diet  Pharmacist Clinical Goal(s):  Patient will contact provider office for questions/concerns as evidenced notation of same in electronic health record through collaboration with PharmD and provider.   Interventions: 1:1 collaboration with Marin Olp, MD regarding development and update of comprehensive plan of care as evidenced by provider attestation and co-signature Inter-disciplinary care team collaboration (see longitudinal plan of care) Comprehensive medication review performed;  medication list updated in electronic medical record No recommendations/changes  Hypertension (BP goal <130/80) -Controlled -Current treatment: Hydrochlorothiazide 25 mg tablet - half tablet (12.5 mg) once daily  -Medications previously tried: n/a  -Current home readings: n/a -Denies hypotensive/hypertensive symptoms -Educated on BP goals and benefits of medications for prevention of heart attack, stroke and kidney damage; Daily salt intake goal < 2300 mg; -Recommended to continue current medication  Hyperlipidemia: (LDL goal < 70) -Controlled  -TG elevation 197 on 05/2020 (111 04/2019) -HTN, CVA Hx; former smoker 60 pack year hx (quit >15 years ago) -Current treatment: Rosuvastatin 40 mg once daily - -Medications previously tried: atorvastatin 40 mg most recently, simvastatin 20 mg.  -CVA Hx, on Plavix alone.  -Current dietary patterns: frosted  mini wheats, coffee. Ham sandwich. Alcohol - does not consume other than occasionally during the weekend.  -Current exercise habits: no formal routine, walking some with husband.  -Educated on Cholesterol goals;  Exercise goal of 150 minutes per week; -Recommended to continue current medication Agreed on inital exercise goal of walking 2-3x/week; minimize carb rich foods such as bread, rice, crackers   Depression (Goal: ensure safe medication use) -Controlled -Current treatment: Pristiq 29m ER daily -Medications previously tried/failed: Zoloft 100 mg once daily, Lexapro  -Educated on Benefits of medication for symptom control -Recommended to continue current medication Declining any additional support at this time  Update 03/14/21 [facility] [place]  Reports that it is working well. PHQ9 SCORE ONLY 01/04/2021 11/23/2020 06/11/2020  PHQ-9 Total Score 5 11 1    PHQ 9 continues to improve. Continue current meds for now, no issues with copay.  Osteopenia Goal ensure appropriate supplementation, exercise) -Controlled -Last  DEXA Scan: 09/2017 - Osteopenia -VitD 42 (04/2019)  -SSRI PPI use -Patient is not a candidate for pharmacologic treatment -Current treatment  Calcium - none at present Vitamin D3 2000 units -Recommend 1200 mg of calcium daily from dietary and supplemental sources. Recommend weight-bearing and muscle strengthening exercises for building and maintaining bone density.  Update 03/14/21 Patient last DEXA was 09/2017 and it showed osteopenia.  Recommended she get updated scan.  She usually works with her gynecologist on this, has appointment there next month - will discuss this at that time. At this time continue current management, continue to pursue muscle strengthening exercises.  GERD (Goal: minimize symptoms) Some occasional breakthrough acid reflux, identifies tomato sauce as common trigger, somewhat aggravated by coffee as well.  -Not ideally controlled -B12 605 (11/2019) -Current treatment  Pantoprazole 20 mg once daily  -Medications previously tried: Cimetidine, famotidine, pantoprazole 40 mg, zantac, lansoprazole   -Recommended to continue current medication, patient will supplement breakthrough reflux with prn Pepcid 10-20 mg once or twice daily as needed  Patient Goals/Self-Care Activities Patient will:  - take medications as prescribed target a minimum of 150 minutes of moderate intensity exercise weekly  supplement breakthrough reflux with prn Pepcid 10-20 mg once or twice daily   Medication Assistance: None required.  Patient affirms current coverage meets needs. Patient's preferred pharmacy is:  [id number] [place], [place] - [address] [facility] [id number]: [phone]: [phone] Up:  Patient agrees to Care Plan and Follow-up. Plan: FU 6 months        Future Appointments  Date Time Provider [place]2/07/2021  2:30 PM [facility] HEALTH COACH [facility]  3:40 PM HuYong ChannelStBrayton MarsMD [facility]  PEDerbyPharmD Clinical Pharmacist [phone]

## 2021-03-14 ENCOUNTER — Ambulatory Visit (INDEPENDENT_AMBULATORY_CARE_PROVIDER_SITE_OTHER): Payer: Medicare Other | Admitting: Pharmacist

## 2021-03-14 DIAGNOSIS — F321 Major depressive disorder, single episode, moderate: Secondary | ICD-10-CM

## 2021-03-14 DIAGNOSIS — M8580 Other specified disorders of bone density and structure, unspecified site: Secondary | ICD-10-CM

## 2021-03-14 NOTE — Patient Instructions (Addendum)
Visit Information   Goals Addressed             This Visit's Progress    Prevent Falls and Broken Bones-Osteoporosis       Timeframe:  Long-Range Goal Priority:  High Start Date:   03/14/21                          Expected End Date:   04/31/23                    Follow Up Date 06/14/21    - make an emergency alert plan in case I fall - pick up clutter from the floors - remove, or use a non-slip pad, with my throw rugs    Why is this important?   When you fall, there are 3 things that control if a bone breaks or not.  These are the fall itself, how hard and the direction that you fall and how fragile your bones are.  Preventing falls is very important for you because of fragile bones.     Notes:        Patient Care Plan: CCM Pharmacy Care Plan     Problem Identified: HLD HTN CVA Hx Depression Osteopenia   Priority: High     Long-Range Goal: Disease Management   Start Date: 08/25/2020  Expected End Date: 08/25/2021  Recent Progress: On track  Priority: High  Note:   Current Barriers:  Maintain HLD control, minimal exercise, carb rich diet  Pharmacist Clinical Goal(s):  Patient will contact provider office for questions/concerns as evidenced notation of same in electronic health record through collaboration with PharmD and provider.   Interventions: 1:1 collaboration with Marin Olp, MD regarding development and update of comprehensive plan of care as evidenced by provider attestation and co-signature Inter-disciplinary care team collaboration (see longitudinal plan of care) Comprehensive medication review performed; medication list updated in electronic medical record No recommendations/changes  Hypertension (BP goal <130/80) -Controlled -Current treatment: Hydrochlorothiazide 25 mg tablet - half tablet (12.5 mg) once daily  -Medications previously tried: n/a  -Current home readings: n/a -Denies hypotensive/hypertensive symptoms -Educated on BP goals  and benefits of medications for prevention of heart attack, stroke and kidney damage; Daily salt intake goal < 2300 mg; -Recommended to continue current medication  Hyperlipidemia: (LDL goal < 70) -Controlled  -TG elevation 197 on 05/2020 (111 04/2019) -HTN, CVA Hx; former smoker 60 pack year hx (quit >15 years ago) -Current treatment: Rosuvastatin 40 mg once daily - -Medications previously tried: atorvastatin 40 mg most recently, simvastatin 20 mg.  -CVA Hx, on Plavix alone.  -Current dietary patterns: frosted mini wheats, coffee. Ham sandwich. Alcohol - does not consume other than occasionally during the weekend.  -Current exercise habits: no formal routine, walking some with husband.  -Educated on Cholesterol goals;  Exercise goal of 150 minutes per week; -Recommended to continue current medication Agreed on inital exercise goal of walking 2-3x/week; minimize carb rich foods such as bread, rice, crackers   Depression (Goal: ensure safe medication use) -Controlled -Current treatment: Pristiq 50mg  ER daily -Medications previously tried/failed: Zoloft 100 mg once daily, Lexapro  -Educated on Benefits of medication for symptom control -Recommended to continue current medication Declining any additional support at this time  Update 03/14/21 Switched to [employer] [facility] from [place].  Reports that it is working well. PHQ9 SCORE ONLY 01/04/2021 11/23/2020 06/11/2020  PHQ-9 Total Score 5 11 1    PHQ 9 continues to  improve. Continue current meds for now, no issues with copay.  Osteopenia Goal ensure appropriate supplementation, exercise) -Controlled -Last DEXA Scan: 09/2017 - Osteopenia -VitD 42 (04/2019)  -SSRI PPI use -Patient is not a candidate for pharmacologic treatment -Current treatment  Calcium - none at present Vitamin D3 2000 units -Recommend 1200 mg of calcium daily from dietary and supplemental sources. Recommend weight-bearing and muscle strengthening exercises for building and  maintaining bone density.  Update 03/14/21 Patient last DEXA was 09/2017 and it showed osteopenia.  Recommended she get updated scan.  She usually works with her gynecologist on this, has appointment there next month - will discuss this at that time. At this time continue current management, continue to pursue muscle strengthening exercises.  GERD (Goal: minimize symptoms) Some occasional breakthrough acid reflux, identifies tomato sauce as common trigger, somewhat aggravated by coffee as well.  -Not ideally controlled -B12 605 (11/2019) -Current treatment  Pantoprazole 20 mg once daily  -Medications previously tried: Cimetidine, famotidine, pantoprazole 40 mg, zantac, lansoprazole   -Recommended to continue current medication, patient will supplement breakthrough reflux with prn Pepcid 10-20 mg once or twice daily as needed  Patient Goals/Self-Care Activities Patient will:  - take medications as prescribed target a minimum of 150 minutes of moderate intensity exercise weekly  supplement breakthrough reflux with prn Pepcid 10-20 mg once or twice daily   Medication Assistance: None required.  Patient affirms current coverage meets needs. Patient's preferred pharmacy [facility]:  [id number] [place], [place] - [address] [facility] [place] [address] Phone: [phone] Fax: [phone]  Follow Up:  Patient agrees to Care Plan and Follow-up. Plan: FU 6 months         Patient verbalizes understanding of instructions provided today and agrees to view in [place].  Telephone follow up appointment with pharmacy team member scheduled for: 6 months  Edythe Clarity, [place]

## 2021-04-18 DIAGNOSIS — H25813 Combined forms of age-related cataract, bilateral: Secondary | ICD-10-CM | POA: Diagnosis not present

## 2021-04-19 ENCOUNTER — Telehealth: Payer: Self-pay | Admitting: Pharmacist

## 2021-04-19 NOTE — Chronic Care Management (AMB) (Signed)
Chronic Care Management Pharmacy Assistant   Name: Brianna Simon  MRN: [id number] DOB: [birth year]   Reason for Encounter: General Adherence Call    Recent office visits:  None  Recent consult visits:  None  Hospital visits:  None in previous 6 months  Medications: Outpatient Encounter Medications as of 04/19/2021  Medication Sig Note   azelastine (ASTELIN) 0.1 % nasal spray daily.    CALCIUM PO Take by mouth.    cholecalciferol (VITAMIN D) 1000 units tablet Take 2,000 Units by mouth daily.  11/05/2018: Pt currently takes 2000 IU daily   clopidogrel (PLAVIX) 75 MG tablet Take 1 tablet (75 mg total) by mouth daily.    desvenlafaxine (PRISTIQ) 50 MG 24 hr tablet TAKE 1 TABLET BY MOUTH EVERY DAY    famotidine (PEPCID) 10 MG tablet Take 10 mg by mouth as needed for heartburn or indigestion.    hydrochlorothiazide (HYDRODIURIL) 25 MG tablet Take 0.5 tablets (12.5 mg total) by mouth daily.    ipratropium (ATROVENT) 0.06 % nasal spray INSTILL 2 SPRAYS IN EACH NOSTRIL 3 TIMES DAILY AS NEEDED    levocetirizine (XYZAL) 5 MG tablet Take 5 mg by mouth every morning.    Melatonin 1 MG TABS Take 5 mg by mouth daily.    Multiple Vitamin (MULTIVITAMIN) tablet Take 1 tablet by mouth daily.    pantoprazole (PROTONIX) 20 MG tablet Take 1 tablet (20 mg total) by mouth daily.    rosuvastatin (CRESTOR) 40 MG tablet Take 1 tablet (40 mg total) by mouth daily.    No facility-administered encounter medications on file as of 04/19/2021.    Reviewed chart prior to disease state call. Spoke with patient regarding BP  Recent Office Vitals: BP Readings from Last 3 Encounters:  01/04/21 126/80  12/16/20 120/60  11/23/20 111/73   Pulse Readings from Last 3 Encounters:  01/04/21 78  12/16/20 86  11/23/20 65    Wt Readings from Last 3 Encounters:  01/04/21 149 lb 3.2 oz (67.7 kg)  12/16/20 144 lb (65.3 kg)  11/23/20 150 lb 9.6 oz (68.3 kg)     Kidney Function Lab Results  Component  Value Date/Time   CREATININE 0.65 11/23/2020 03:47 PM   CREATININE 0.67 05/24/2020 03:11 PM   CREATININE 0.67 12/25/2019 03:19 PM   CREATININE 0.63 11/14/2019 03:28 PM   GFR 90.72 11/23/2020 03:47 PM   GFRNONAA 91 12/25/2019 03:19 PM   GFRAA 105 12/25/2019 03:19 PM    BMP Latest Ref Rng & Units 11/23/2020 05/24/2020 12/25/2019  Glucose 70 - 99 mg/dL 79 77 85  BUN 6 - 23 mg/dL 14 14 15   Creatinine 0.40 - 1.20 mg/dL 0.65 0.67 0.67  BUN/Creat Ratio 6 - 22 (calc) - - NOT APPLICABLE  Sodium 401 - 145 mEq/L 141 139 141  Potassium 3.5 - 5.1 mEq/L 4.2 3.8 4.6  Chloride 96 - 112 mEq/L 104 103 105  CO2 19 - 32 mEq/L 31 31 32  Calcium 8.4 - 10.5 mg/dL 9.5 9.1 9.6   Patient Questions:  Have you had any problems recently with your health? Patient states she has not had any problems recently with her health.  Have you had any problems with your pharmacy? Patient states she has not had any problems with her pharmacy.  What issues or side effects are you having with your medications? Patient states she is not currently having any issues or side effects with any of her medications.  What would you like me to pass along  to Leata Mouse, CPP for him to help you with?  Patient states she does not have anything to pass along at this time.  What can we do to take care of you better? Patient did not have any suggestions. She is happy with her current level of care.  Care Gaps: Medicare Annual Wellness: Completed Hemoglobin A1C: 5.5% on 02/27/2015 Colonoscopy: Aged out, last completed 07/01/2007 Fecal DNA (Cologuard): Overdue since 03/21/2021 Dexa Scan: Completed Mammogram: Next due on 02/18/2023  Patient states she plans to complete Cologuard soon.  Future Appointments  Date Time Provider [place]  06/17/2021  2:30 PM [facility] HEALTH COACH [facility]  07/14/2021  3:40 PM Marin Olp, MD [facility]  09/20/2021  2:15 PM [facility] PHARMACIST [facility]    Star Rating  Drugs: Rosuvastatin 40 mg last filled 02/28/2021 90 DS  April D Calhoun, [place] Pharmacist Assistant [phone]

## 2021-05-12 DIAGNOSIS — Z01419 Encounter for gynecological examination (general) (routine) without abnormal findings: Secondary | ICD-10-CM | POA: Diagnosis not present

## 2021-06-06 DIAGNOSIS — L821 Other seborrheic keratosis: Secondary | ICD-10-CM | POA: Diagnosis not present

## 2021-06-06 DIAGNOSIS — L570 Actinic keratosis: Secondary | ICD-10-CM | POA: Diagnosis not present

## 2021-06-06 DIAGNOSIS — D1801 Hemangioma of skin and subcutaneous tissue: Secondary | ICD-10-CM | POA: Diagnosis not present

## 2021-06-06 DIAGNOSIS — D170 Benign lipomatous neoplasm of skin and subcutaneous tissue of head, face and neck: Secondary | ICD-10-CM | POA: Diagnosis not present

## 2021-06-06 DIAGNOSIS — Z85828 Personal history of other malignant neoplasm of skin: Secondary | ICD-10-CM | POA: Diagnosis not present

## 2021-06-06 DIAGNOSIS — L720 Epidermal cyst: Secondary | ICD-10-CM | POA: Diagnosis not present

## 2021-06-17 ENCOUNTER — Other Ambulatory Visit: Payer: Self-pay

## 2021-06-17 ENCOUNTER — Ambulatory Visit (INDEPENDENT_AMBULATORY_CARE_PROVIDER_SITE_OTHER): Payer: Medicare Other

## 2021-06-17 VITALS — BP 110/68 | HR 75 | Temp 98.1°F | Wt 150.0 lb

## 2021-06-17 DIAGNOSIS — Z0000 Encounter for general adult medical examination without abnormal findings: Secondary | ICD-10-CM | POA: Diagnosis not present

## 2021-06-17 NOTE — Patient Instructions (Signed)
Brianna Simon , Thank you for taking time to come for your Medicare Wellness Visit. I appreciate your ongoing commitment to your health goals. Please review the following plan we discussed and let me know if I can assist you in the future.   Screening recommendations/referrals: Colonoscopy: pt will discuss with dr Yong Channel in March  Mammogram: Done 02/17/21 repeat every year  Bone Density: Done 10/02/17 repeat every 2 years  Recommended yearly ophthalmology/optometry visit for glaucoma screening and checkup Recommended yearly dental visit for hygiene and checkup  Vaccinations: Influenza vaccine: Done 02/24/21 repeat every year  Pneumococcal vaccine: Up to date Tdap vaccine: Done 10/26/20 repeat every 10 years  Shingles vaccine: Done 5/15, 12/26/16   Covid-19:Completed 2/5, 2/26, 03/01/20 & 4/29, 04/09/21  Advanced directives: Please bring a copy of your health care power of attorney and living will to the office at your convenience.  Conditions/risks identified: Lose weight   Next appointment: Follow up in one year for your annual wellness visit    Preventive Care 65 Years and Older, Female Preventive care refers to lifestyle choices and visits with your health care provider that can promote health and wellness. What does preventive care include? A yearly physical exam. This is also called an annual well check. Dental exams once or twice a year. Routine eye exams. Ask your health care provider how often you should have your eyes checked. Personal lifestyle choices, including: Daily care of your teeth and gums. Regular physical activity. Eating a healthy diet. Avoiding tobacco and drug use. Limiting alcohol use. Practicing safe sex. Taking low-dose aspirin every day. Taking vitamin and mineral supplements as recommended by your health care provider. What happens during an annual well check? The services and screenings done by your health care provider during your annual well check  will depend on your age, overall health, lifestyle risk factors, and family history of disease. Counseling  Your health care provider may ask you questions about your: Alcohol use. Tobacco use. Drug use. Emotional well-being. Home and relationship well-being. Sexual activity. Eating habits. History of falls. Memory and ability to understand (cognition). Work and work Statistician. Reproductive health. Screening  You may have the following tests or measurements: Height, weight, and BMI. Blood pressure. Lipid and cholesterol levels. These may be checked every 5 years, or more frequently if you are over 90 years old. Skin check. Lung cancer screening. You may have this screening every year starting at age 79 if you have a 30-pack-year history of smoking and currently smoke or have quit within the past 15 years. Fecal occult blood test (FOBT) of the stool. You may have this test every year starting at age 75. Flexible sigmoidoscopy or colonoscopy. You may have a sigmoidoscopy every 5 years or a colonoscopy every 10 years starting at age 51. Hepatitis C blood test. Hepatitis B blood test. Sexually transmitted disease (STD) testing. Diabetes screening. This is done by checking your blood sugar (glucose) after you have not eaten for a while (fasting). You may have this done every 1-3 years. Bone density scan. This is done to screen for osteoporosis. You may have this done starting at age 69. Mammogram. This may be done every 1-2 years. Talk to your health care provider about how often you should have regular mammograms. Talk with your health care provider about your test results, treatment options, and if necessary, the need for more tests. Vaccines  Your health care provider may recommend certain vaccines, such as: Influenza vaccine. This is recommended every year.  Tetanus, diphtheria, and acellular pertussis (Tdap, Td) vaccine. You may need a Td booster every 10 years. Zoster vaccine. You  may need this after age 52. Pneumococcal 13-valent conjugate (PCV13) vaccine. One dose is recommended after age 65. Pneumococcal polysaccharide (PPSV23) vaccine. One dose is recommended after age 54. Talk to your health care provider about which screenings and vaccines you need and how often you need them. This information is not intended to replace advice given to you by your health care provider. Make sure you discuss any questions you have with your health care provider. Document Released: 05/28/2015 Document Revised: 01/19/2016 Document Reviewed: 03/02/2015 Elsevier Interactive Patient Education  2017 [place] Prevention in the Home Falls can cause injuries. They can happen to people of all ages. There are many things you can do to make your home safe and to help prevent falls. What can I do on the outside of my home? Regularly fix the edges of walkways and driveways and fix any cracks. Remove anything that might make you trip as you walk through a door, such as a raised step or threshold. Trim any bushes or trees on the path to your home. Use bright outdoor lighting. Clear any walking paths of anything that might make someone trip, such as rocks or tools. Regularly check to see if handrails are loose or broken. Make sure that both sides of any steps have handrails. Any raised decks and porches should have guardrails on the edges. Have any leaves, snow, or ice cleared regularly. Use sand or salt on walking paths during winter. Clean up any spills in your garage right away. This includes oil or grease spills. What can I do in the bathroom? Use night lights. Install grab bars by the toilet and in the tub and shower. Do not use towel bars as grab bars. Use non-skid mats or decals in the tub or shower. If you need to sit down in the shower, use a plastic, non-slip stool. Keep the floor dry. Clean up any water that spills on the floor as soon as it happens. Remove soap buildup  in the tub or shower regularly. Attach bath mats securely with double-sided non-slip rug tape. Do not have throw rugs and other things on the floor that can make you trip. What can I do in the bedroom? Use night lights. Make sure that you have a light by your bed that is easy to reach. Do not use any sheets or blankets that are too big for your bed. They should not hang down onto the floor. Have a firm chair that has side arms. You can use this for support while you get dressed. Do not have throw rugs and other things on the floor that can make you trip. What can I do in the kitchen? Clean up any spills right away. Avoid walking on wet floors. Keep items that you use a lot in easy-to-reach places. If you need to reach something above you, use a strong step stool that has a grab bar. Keep electrical cords out of the way. Do not use floor polish or wax that makes floors slippery. If you must use wax, use non-skid floor wax. Do not have throw rugs and other things on the floor that can make you trip. What can I do with my stairs? Do not leave any items on the stairs. Make sure that there are handrails on both sides of the stairs and use them. Fix handrails that are broken or loose.  Make sure that handrails are as long as the stairways. Check any carpeting to make sure that it is firmly attached to the stairs. Fix any carpet that is loose or worn. Avoid having throw rugs at the top or bottom of the stairs. If you do have throw rugs, attach them to the floor with carpet tape. Make sure that you have a light switch at the top of the stairs and the bottom of the stairs. If you do not have them, ask someone to add them for you. What else can I do to help prevent falls? Wear shoes that: Do not have high heels. Have rubber bottoms. Are comfortable and fit you well. Are closed at the toe. Do not wear sandals. If you use a stepladder: Make sure that it is fully opened. Do not climb a closed  stepladder. Make sure that both sides of the stepladder are locked into place. Ask someone to hold it for you, if possible. Clearly mark and make sure that you can see: Any grab bars or handrails. First and last steps. Where the edge of each step is. Use tools that help you move around (mobility aids) if they are needed. These include: Canes. Walkers. Scooters. Crutches. Turn on the lights when you go into a dark area. Replace any light bulbs as soon as they burn out. Set up your furniture so you have a clear path. Avoid moving your furniture around. If any of your floors are uneven, fix them. If there are any pets around you, be aware of where they are. Review your medicines with your doctor. Some medicines can make you feel dizzy. This can increase your chance of falling. Ask your doctor what other things that you can do to help prevent falls. This information is not intended to replace advice given to you by your health care provider. Make sure you discuss any questions you have with your health care provider. Document Released: 02/25/2009 Document Revised: 10/07/2015 Document Reviewed: 06/05/2014 Elsevier Interactive Patient Education  2017 [employer].

## 2021-06-17 NOTE — Progress Notes (Signed)
Subjective:   Brianna Simon is a 71 y.o. female who presents for Medicare Annual (Subsequent) preventive examination.  Review of Systems     Cardiac Risk Factors include: advanced age (>51men, >66 women);dyslipidemia;hypertension     Objective:    Today's Vitals   06/17/21 1428  BP: 110/68  Pulse: 75  Temp: 98.1 F (36.7 C)  SpO2: 96%  Weight: 150 lb (68 kg)   Body mass index is 27.44 kg/m.  Advanced Directives 06/17/2021 06/11/2020 04/28/2019 02/27/2015 02/26/2015 10/23/2013  Does Patient Have a Medical Advance Directive? Yes Yes Yes No No Patient does not have advance directive;Patient would not like information  Type of Scientist, forensic Power of [place];Living will Living will;Healthcare Power of Attorney - - -  Does patient want to make changes to medical advance directive? - - No - Patient declined - - -  Copy of [place] in Chart? No - copy requested No - copy requested No - copy requested - - -  Pre-existing out of facility DNR order (yellow form or pink MOST form) - - - - - No    Current Medications (verified) Outpatient Encounter Medications as of 06/17/2021  Medication Sig   azelastine (ASTELIN) 0.1 % nasal spray daily.   CALCIUM PO Take by mouth.   cholecalciferol (VITAMIN D) 1000 units tablet Take 2,000 Units by mouth daily.    clopidogrel (PLAVIX) 75 MG tablet Take 1 tablet (75 mg total) by mouth daily.   desvenlafaxine (PRISTIQ) 50 MG 24 hr tablet TAKE 1 TABLET BY MOUTH EVERY DAY   famotidine (PEPCID) 10 MG tablet Take 10 mg by mouth as needed for heartburn or indigestion.   hydrochlorothiazide (HYDRODIURIL) 25 MG tablet Take 0.5 tablets (12.5 mg total) by mouth daily.   ipratropium (ATROVENT) 0.06 % nasal spray INSTILL 2 SPRAYS IN EACH NOSTRIL 3 TIMES DAILY AS NEEDED   levocetirizine (XYZAL) 5 MG tablet Take 5 mg by mouth every morning.   Melatonin 1 MG TABS Take 5 mg by mouth daily.    Multiple Vitamin (MULTIVITAMIN) tablet Take 1 tablet by mouth daily.   pantoprazole (PROTONIX) 20 MG tablet Take 1 tablet (20 mg total) by mouth daily.   rosuvastatin (CRESTOR) 40 MG tablet Take 1 tablet (40 mg total) by mouth daily.   No facility-administered encounter medications on file as of 06/17/2021.    Allergies (verified) Citalopram hydrobromide, Codeine phosphate, Erythromycin, Nitrofurantoin, Nsaids, Sulfamethoxazole, and Sulfamethoxazole-trimethoprim   History: Past Medical History:  Diagnosis Date   Anxiety    Arthritis    hips, hands, lower back - otc med prn   Cancer ([place])    skin cancer left wrist and left chest   GERD (gastroesophageal reflux disease)    Hx: UTI (urinary tract infection)    Hyperlipidemia    Hypertension    Mitral valve prolapse    does not cause patient any problems   Seasonal allergies    Stroke ([place]) 2011   mild   SVD (spontaneous vaginal delivery)    x 1 - gave up for adoption   Past Surgical History:  Procedure Laterality Date   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    COLONOSCOPY     DILATATION & CURETTAGE/HYSTEROSCOPY WITH TRUECLEAR N/A 10/24/2013   Procedure: [place];  Surgeon: Logan Bores, MD;  Location: [facility];  Service: Gynecology;  Laterality: N/A;  1 1/2hrs OR time  EP IMPLANTABLE DEVICE N/A 03/02/2015   Procedure: Loop Recorder Insertion;  Surgeon: Will Meredith Leeds, MD;  Location: [facility];  Service: Cardiovascular;  Laterality: N/A;   lipoma removed     right shoulder   SINUS SURGERY WITH INSTATRAK     WISDOM TOOTH EXTRACTION     Family History  Problem Relation Age of Onset   Arrhythmia Mother 58       pacemaker    Stroke Mother    Stroke Paternal Grandmother    Diverticulitis Other    Breast cancer Maternal Aunt    Breast cancer Cousin    Social History   Socioeconomic History   Marital status: Married    Spouse name: Not on  file   Number of children: Not on file   Years of education: Not on file   Highest education level: Not on file  Occupational History   Occupation: [place] [employer]    Employer: [place] [employer]  Tobacco Use   Smoking status: Former    Packs/day: 2.00    Years: 30.00    Pack years: 60.00    Types: Cigarettes    Quit date: 03/17/2003    Years since quitting: 18.2   Smokeless tobacco: Never  Substance and Sexual Activity   Alcohol use: Yes    Alcohol/week: 3.0 standard drinks    Types: 1 Glasses of wine, 1 Cans of beer, 1 Shots of liquor per week    Comment: occasionally   Drug use: No   Sexual activity: Yes    Birth control/protection: Post-menopausal  Other Topics Concern   Not on file  Social History Narrative   Married. No children. Husband patient of Dr. 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       Hobbies: dancing, time out with husband, enjoys concerts   Social Determinants of Radio broadcast assistant Strain: Low Risk    Difficulty of Paying Living Expenses: Not hard at all  Food Insecurity: No Food Insecurity   Worried About Charity fundraiser in the Last Year: Never true   Arboriculturist in the Last Year: Never true  Transportation Needs: No Transportation Needs   Lack of Transportation (Medical): No   Lack of Transportation (Non-Medical): No  Physical Activity: Inactive   Days of Exercise per Week: 0 days   Minutes of Exercise per Session: 0 min  Stress: No Stress Concern Present   Feeling of Stress : Not at all  Social Connections: Moderately Isolated   Frequency of Communication with Friends and Family: Three times a week   Frequency of Social Gatherings with Friends and Family: More than three times a week   Attends Religious Services: Never   Marine scientist or Organizations: No   Attends Music therapist: Never   Marital Status: Married    Tobacco Counseling Counseling given: Not Answered   Clinical  Intake:  Pre-visit preparation completed: Yes  Pain : No/denies pain     BMI - recorded: 27.44 Nutritional Status: BMI 25 -29 Overweight Nutritional Risks: None Diabetes: No  How often do you need to have someone help you when you read instructions, pamphlets, or other written materials from your doctor or pharmacy?: 1 - Never  Diabetic?no  Interpreter Needed?: No  Information entered by :: Charlott Rakes, LPN   Activities of Daily Living In your present state of health, do you have any difficulty performing the following  activities: 06/17/2021  Hearing? Y  Comment wears hearing aids  Vision? N  Difficulty concentrating or making decisions? N  Walking or climbing stairs? N  Comment SOB at times  Dressing or bathing? N  Doing errands, shopping? N  Preparing Food and eating ? N  Using the Toilet? N  In the past six months, have you accidently leaked urine? N  Comment occassional cough  Do you have problems with loss of bowel control? N  Managing your Medications? N  Managing your Finances? N  Housekeeping or managing your Housekeeping? N  Some recent data might be hidden    Patient Care Team: Marin Olp, MD as PCP - General (Family Medicine) Paula Compton, MD as Consulting Physician (Obstetrics and Gynecology) Evelina Bucy, DPM as Consulting Physician (Podiatry) Garvin Fila, MD as Consulting Physician (Neurology) [facility] (Ophthalmology) Edythe Clarity, [facility] (Pharmacist)  Indicate any recent Medical Services you may have received from other than [facility] providers in the past year (date may be approximate).     Assessment:   This is a routine wellness examination for [place].  Hearing/Vision screen Hearing Screening - Comments:: Pt wears hearing aids  Vision Screening - Comments:: Pt follows up with Dr Ishmael Holter for annual eye exams   Dietary issues and exercise activities discussed: Current Exercise Habits: The patient does  not participate in regular exercise at present   Goals Addressed             This Visit's Progress    Patient Stated       Lose weight        Depression Screen PHQ 2/9 Scores 06/17/2021 01/04/2021 11/23/2020 06/11/2020 05/24/2020 11/14/2019 05/06/2019  PHQ - 2 Score 0 2 4 1 1 2  0  PHQ- 9 Score - 5 11 - 6 10 8     Fall Risk Fall Risk  06/17/2021 11/23/2020 06/11/2020 12/25/2019 11/14/2019  Falls in the past year? 0 0 1 0 0  Number falls in past yr: 0 0 1 0 -  Injury with Fall? 0 0 0 0 -  Risk for fall due to : Impaired vision;Impaired balance/gait No Fall Risks Impaired vision;Impaired balance/gait;Impaired mobility - -  Follow up Falls prevention discussed Falls evaluation completed Falls prevention discussed - -    FALL RISK PREVENTION PERTAINING TO THE HOME:  Any stairs in or around the home? No  If so, are there any without handrails? No  Home free of loose throw rugs in walkways, pet beds, electrical cords, etc? Yes  Adequate lighting in your home to reduce risk of falls? Yes   ASSISTIVE DEVICES UTILIZED TO PREVENT FALLS:  Life alert? Yes  Use of a cane, walker or w/c? No  Grab bars in the bathroom? No  Shower chair or bench in shower? No  Elevated toilet seat or a handicapped toilet? No   TIMED UP AND GO:  Was the test performed? Yes .  Length of time to ambulate 10 feet: 10 sec.   Gait steady and fast without use of assistive device  Cognitive Function:     6CIT Screen 06/17/2021 06/11/2020 04/28/2019  What Year? 0 points 0 points 0 points  What month? 0 points 0 points 0 points  What time? 0 points - 0 points  Count back from 20 0 points 0 points 0 points  Months in reverse 0 points 0 points 0 points  Repeat phrase 0 points 0 points 0 points  Total Score 0 - 0  Immunizations Immunization History  Administered Date(s) Administered   Fluad Quad(high Dose 65+) 03/04/2019, 03/18/2020, 02/24/2021   Influenza Split 03/17/2011, 02/09/2012   Influenza Whole  02/12/2008, 02/22/2010   Influenza, High Dose Seasonal PF 02/27/2018   Influenza,inj,Quad PF,6+ Mos 02/10/2013, 03/09/2014, 02/05/2015, 02/25/2016, 02/23/2017   Moderna Sars-Covid-2 Vaccination 04/09/2021   PFIZER Comirnaty(Gray Top)Covid-19 Tri-Sucrose Vaccine 09/10/2020   PFIZER(Purple Top)SARS-COV-2 Vaccination 06/20/2019, 07/11/2019, 03/01/2020, 09/10/2020   PNEUMOCOCCAL CONJUGATE-20 11/23/2020   Pneumococcal Conjugate-13 11/14/2019   Pneumococcal Polysaccharide-23 11/05/2018   Tdap 10/27/2014   Zoster Recombinat (Shingrix) 09/26/2016, 12/26/2016   Zoster, Live 04/03/2013    TDAP status: Up to date  Flu Vaccine status: Up to date  Pneumococcal vaccine status: Up to date  Covid-19 vaccine status: Completed vaccines  Qualifies for Shingles Vaccine? Yes   Zostavax completed Yes   Shingrix Completed?: Yes  Screening Tests Health Maintenance  Topic Date Due   Fecal DNA (Cologuard)  03/21/2021   MAMMOGRAM  02/18/2023   TETANUS/TDAP  10/26/2024   Pneumonia Vaccine 73+ Years old  Completed   INFLUENZA VACCINE  Completed   DEXA SCAN  Completed   COVID-19 Vaccine  Completed   Hepatitis C Screening  Completed   Zoster Vaccines- Shingrix  Completed   HPV VACCINES  Aged Out    Health Maintenance  Health Maintenance Due  Topic Date Due   Fecal DNA (Cologuard)  03/21/2021    Colorectal cancer screening: Type of screening: Cologuard. Completed 03/21/18. Repeat every 3 years pt stated she will discuss with Dr Yong Channel   Mammogram status: Completed 02/17/21. Repeat every year  Bone Density status: Completed 10/02/17. Results reflect: Bone density results: OSTEOPENIA. Repeat every 2 years.   Additional Screening:  Hepatitis C Screening:  Completed 10/27/14  Vision Screening: Recommended annual ophthalmology exams for early detection of glaucoma and other disorders of the eye. Is the patient up to date with their annual eye exam?  Yes  Who is the provider or what is the name of  the office in which the patient attends annual eye exams? Dr Ishmael Holter If pt is not established with a provider, would they like to be referred to a provider to establish care? No .   Dental Screening: Recommended annual dental exams for proper oral hygiene  Community Resource Referral / Chronic Care Management: CRR required this visit?  No   CCM required this visit?  No      Plan:     I have personally reviewed and noted the following in the patients chart:   Medical and social history Use of alcohol, tobacco or illicit drugs  Current medications and supplements including opioid prescriptions.  Functional ability and status Nutritional status Physical activity Advanced directives List of other physicians Hospitalizations, surgeries, and ER visits in previous 12 months Vitals Screenings to include cognitive, depression, and falls Referrals and appointments  In addition, I have reviewed and discussed with patient certain preventive protocols, quality metrics, and best practice recommendations. A written personalized care plan for preventive services as well as general preventive health recommendations were provided to patient.     Willette Brace, LPN   [id number]   Nurse Notes: none

## 2021-06-27 IMAGING — MG MM DIGITAL DIAGNOSTIC UNILAT*R* W/ TOMO W/ CAD
4 series · 4 of 12 positions shown · non-contrast
Comparison: Previous exam(s).

CLINICAL DATA: Screening recall for a right breast asymmetry.

EXAM:
DIGITAL DIAGNOSTIC UNILATERAL RIGHT MAMMOGRAM WITH TOMO AND CAD

[R ML synth-2D]
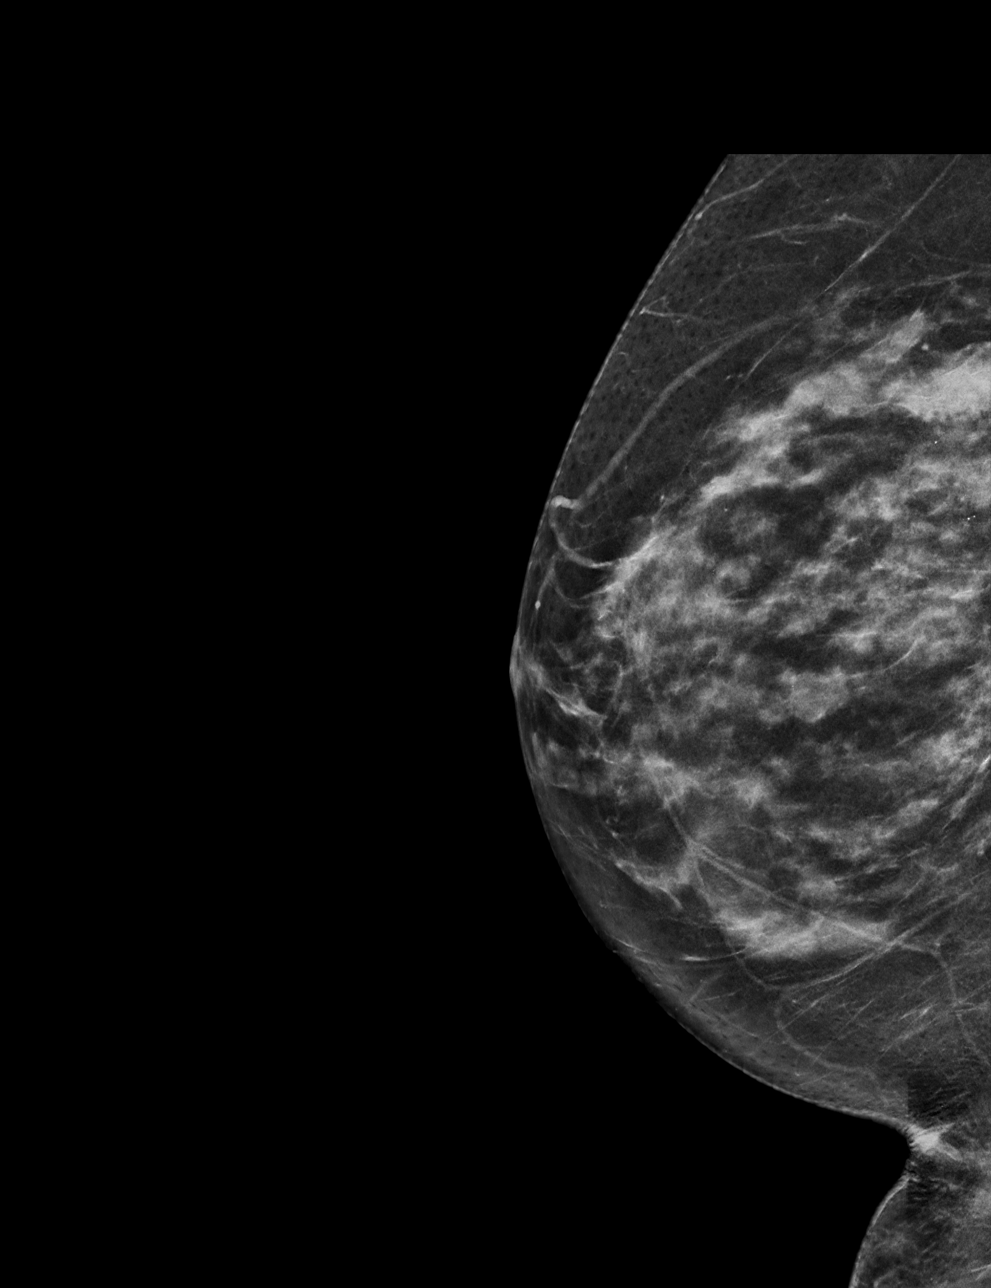

[R CC synth-2D]
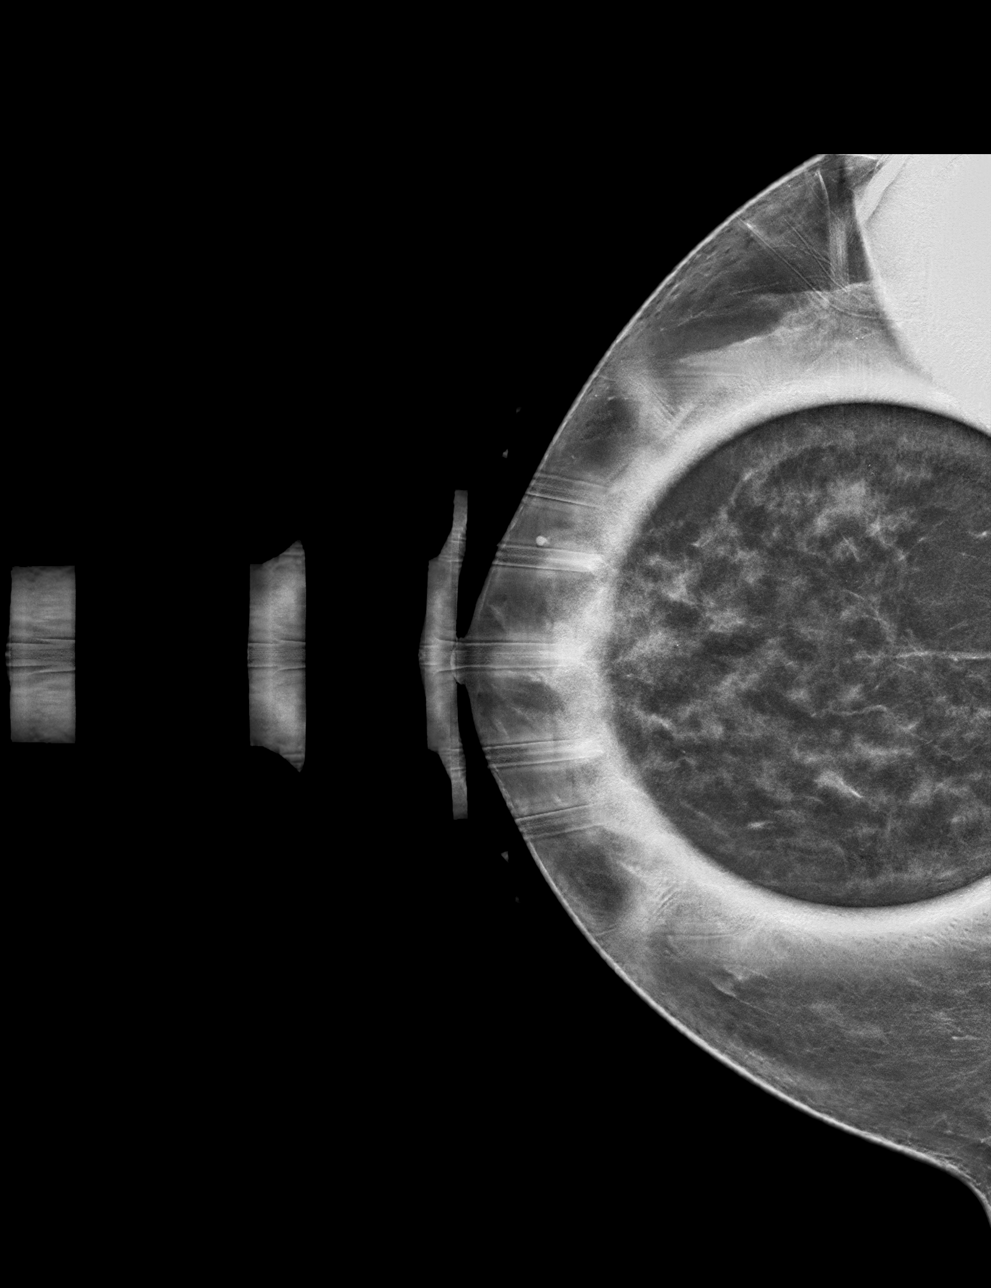

[R CC tomo · tomo slice 31/60.0]
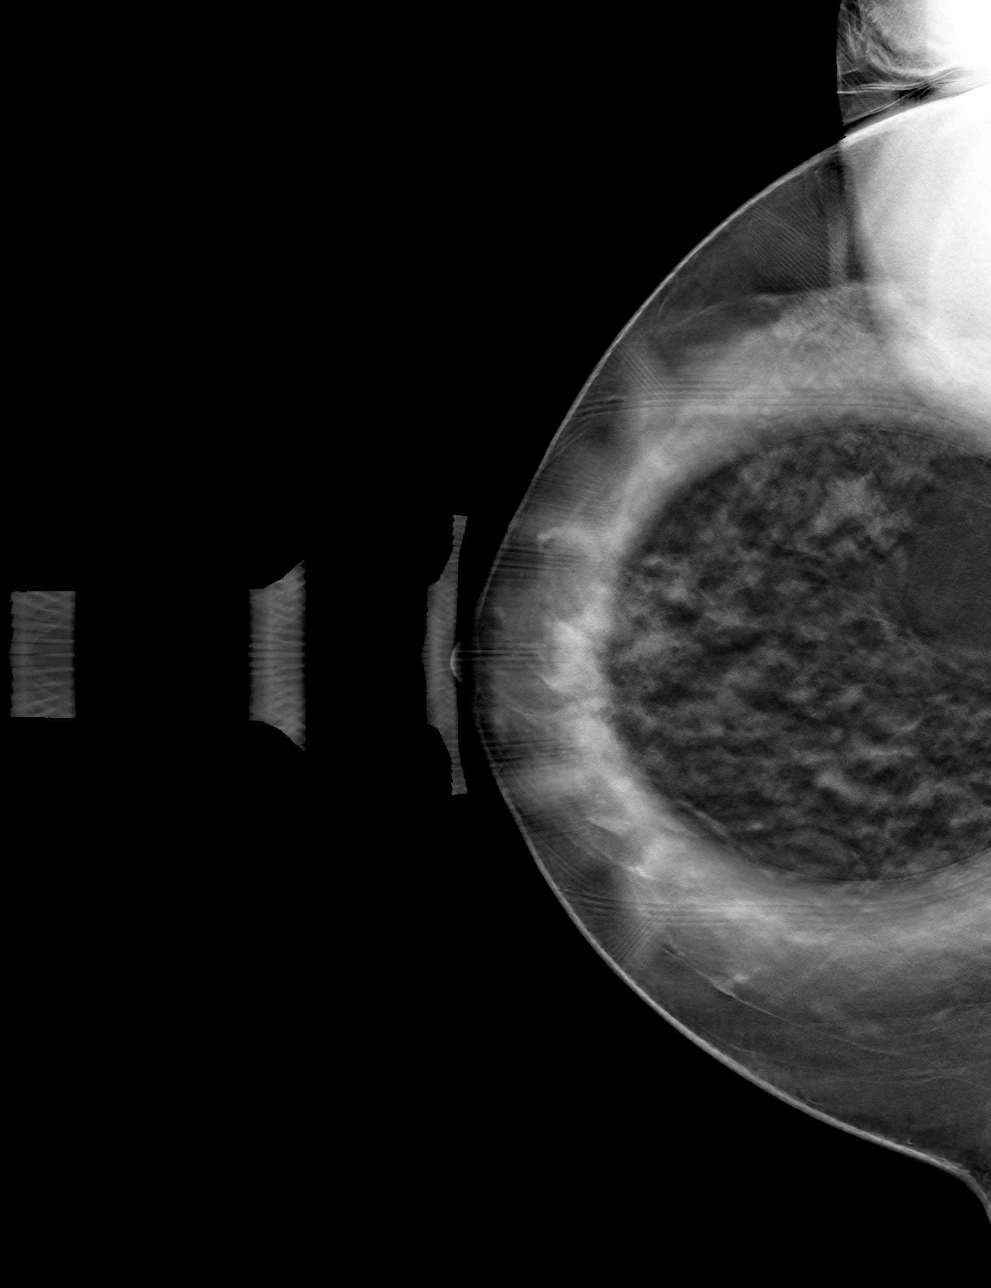

[R ML tomo · tomo slice 30/59.0]
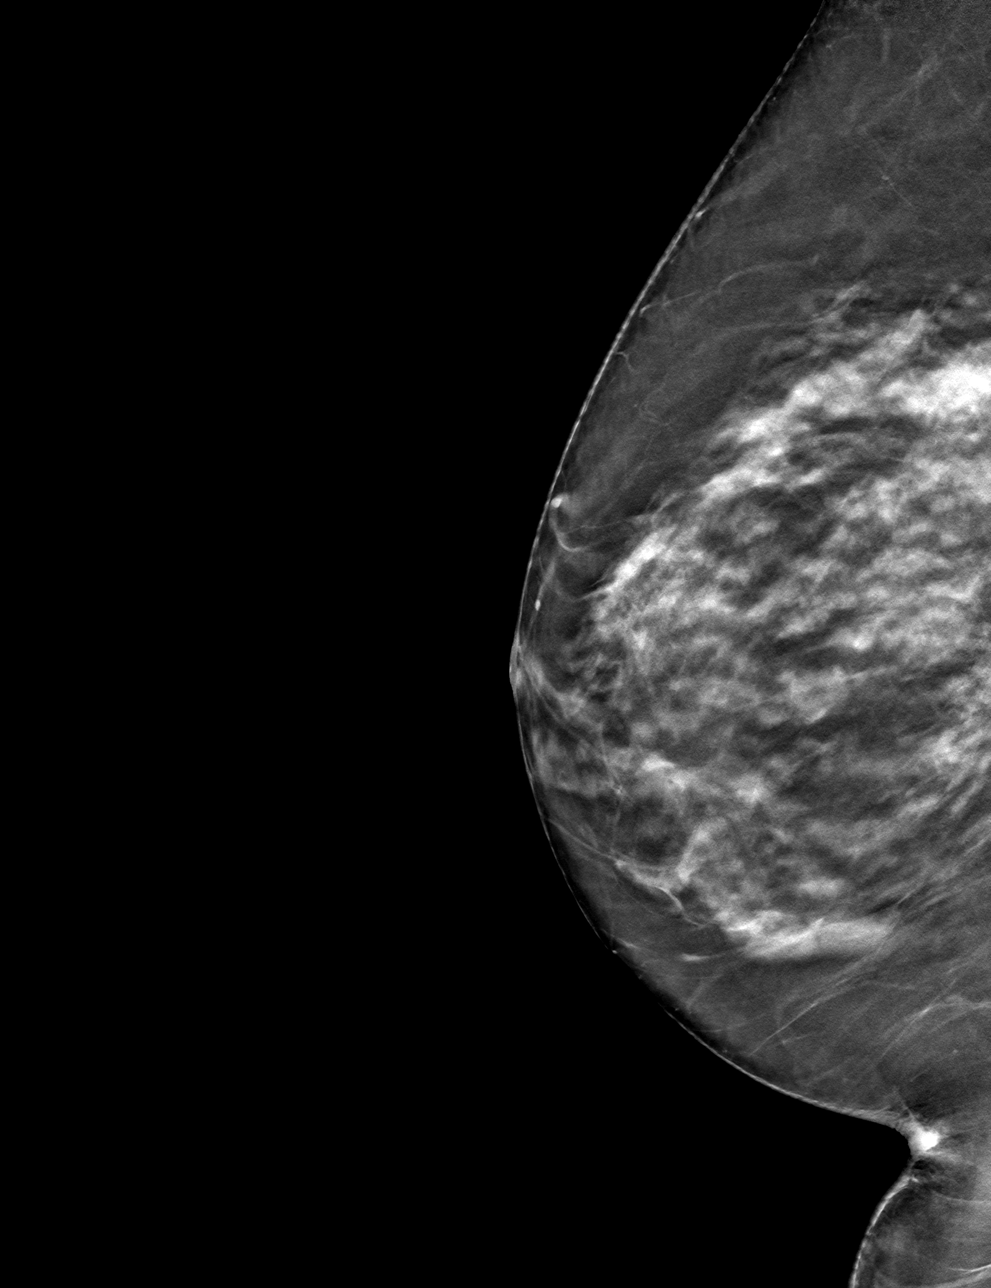

[4 of 12 positions shown; findings below may reference images not displayed]

ACR Breast Density Category c: The breast tissue is heterogeneously
dense, which may obscure small masses.
FINDINGS: The asymmetry in the slightly medial posterior right breast
demonstrates normal fibroglandular tissue on spot compression
tomosynthesis imaging. No suspicious masses or areas of distortion
are identified

Mammographic images were processed with CAD.
IMPRESSION: Resolution of the right breast asymmetry consistent with overlapping
fibroglandular tissue.

RECOMMENDATION:
Screening mammogram in one year.(Code:[id number])

I have discussed the findings and recommendations with the patient.
If applicable, a reminder letter will be sent to the patient
regarding the next appointment.

BI-RADS CATEGORY  1: Negative.

## 2021-07-11 NOTE — Progress Notes (Signed)
Phone [phone] In person visit   Subjective:   Brianna Simon is a 71 y.o. year old very pleasant female patient who presents for/with See problem oriented charting Chief Complaint  Patient presents with   Follow-up   Hyperlipidemia   Depression   Hypertension   Gastroesophageal Reflux   This visit occurred during the SARS-CoV-2 public health emergency.  Safety protocols were in place, including screening questions prior to the visit, additional usage of staff PPE, and extensive cleaning of exam room while observing appropriate contact time as indicated for disinfecting solutions.   Immunization History  Administered Date(s) Administered   Fluad Quad(high Dose 65+) 03/04/2019, 03/18/2020, 02/24/2021   Influenza Split 03/17/2011, 02/09/2012   Influenza Whole 02/12/2008, 02/22/2010   Influenza, High Dose Seasonal PF 02/27/2018   Influenza,inj,Quad PF,6+ Mos 02/10/2013, 03/09/2014, 02/05/2015, 02/25/2016, 02/23/2017   Moderna Sars-Covid-2 Vaccination 04/09/2021   PFIZER Comirnaty(Gray Top)Covid-19 Tri-Sucrose Vaccine 09/10/2020   PFIZER(Purple Top)SARS-COV-2 Vaccination 06/20/2019, 07/11/2019, 03/01/2020, 09/10/2020   PNEUMOCOCCAL CONJUGATE-20 11/23/2020   Pneumococcal Conjugate-13 11/14/2019   Pneumococcal Polysaccharide-23 11/05/2018   Tdap 10/27/2014   Zoster Recombinat (Shingrix) 09/26/2016, 12/26/2016   Zoster, Live 04/03/2013    Past Medical History-  Patient Active Problem List   Diagnosis Date Noted   History of CVA (cerebrovascular accident) 02/22/2010    Priority: High   Fatty liver 05/24/2020    Priority: Medium    Depression, major, single episode, moderate ([place]) 11/14/2019    Priority: Medium    Hemiplegia affecting dominant side, 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[place]) 03/01/2018    Priority: Medium    Osteopenia 10/02/2017    Priority: Medium    Former smoker 12/09/2014    Priority: Medium    Anxiety state 10/27/2014    Priority: Medium    Hyperlipidemia  02/22/2010    Priority: Medium    Essential hypertension 12/07/2006    Priority: Medium    Senile purpura ([place]) 11/05/2018    Priority: Low   Allergic rhinitis 10/27/2014    Priority: Low   Esophageal reflux 12/31/2006    Priority: Low   Insomnia 12/31/2006    Priority: Low   MVP (mitral valve prolapse) 12/10/2006    Priority: Low   Alkaline phosphatase elevation 06/16/2019   Benign paroxysmal positional vertigo 11/13/2016   Low back pain 09/26/2016   Dysarthria 03/02/2015    Medications- reviewed and updated Current Outpatient Medications  Medication Sig Dispense Refill   azelastine (ASTELIN) 0.1 % nasal spray daily.  5   CALCIUM PO Take by mouth.     cholecalciferol (VITAMIN D) 1000 units tablet Take 2,000 Units by mouth daily.      clopidogrel (PLAVIX) 75 MG tablet Take 1 tablet (75 mg total) by mouth daily. 90 tablet 3   desvenlafaxine (PRISTIQ) 50 MG 24 hr tablet TAKE 1 TABLET BY MOUTH EVERY DAY 90 tablet 2   famotidine (PEPCID) 10 MG tablet Take 10 mg by mouth as needed for heartburn or indigestion.     hydrochlorothiazide (HYDRODIURIL) 25 MG tablet Take 0.5 tablets (12.5 mg total) by mouth daily. 45 tablet 3   ipratropium (ATROVENT) 0.06 % nasal spray INSTILL 2 SPRAYS IN EACH NOSTRIL 3 TIMES DAILY AS NEEDED  5   levocetirizine (XYZAL) 5 MG tablet Take 5 mg by mouth every morning.     Melatonin 1 MG TABS Take 5 mg by mouth daily.     Multiple Vitamin (MULTIVITAMIN) tablet Take 1 tablet by mouth daily.     pantoprazole (PROTONIX) 20 MG tablet Take  1 tablet (20 mg total) by mouth daily. 90 tablet 3   rosuvastatin (CRESTOR) 40 MG tablet Take 1 tablet (40 mg total) by mouth daily. 90 tablet 3   No current facility-administered medications for this visit.     Objective:  BP 110/64    Pulse 71    Temp 98.6 F (37 C)    Ht 5\' 2"  (1.575 m)    Wt 150 lb 6.4 oz (68.2 kg)    LMP  (LMP Unknown)    SpO2 95%    BMI 27.51 kg/m  Gen: NAD, resting comfortably CV: RRR no murmurs rubs  or gallops Lungs: CTAB no crackles, wheeze, rhonchi Abdomen: soft/nontender/nondistended/normal bowel sounds. No rebound or guarding.  Ext: no edema Skin: warm, dry Neuro: relative weakness right compared to left    Assessment and Plan   #History of CVA with residual right hemiplegia (numbness mainly right hand with some trouble picking things up as a result- no weakness reported) with loop recorder in place showing no evidence of atrial fibrillation #hyperlipidemia S: Medication: rosuvastatin 40 mg, Plavix 75 mg. No aspirin Lab Results  Component Value Date   CHOL 121 05/24/2020   HDL 44.50 05/24/2020   LDLCALC 37 05/24/2020   LDLDIRECT 75 11/14/2019   TRIG 197.0 (H) 05/24/2020   CHOLHDL 3 05/24/2020   A/P: Hyperlipidemia previously well controlled-we are going to update a lipid panel to see how she is doing today-for now continue current medication.  For history of stroke continue Plavix - weakness on right side- stable/noted- continue plavix for prevention  # Depression/Anxiety S: Medication: pristiq 50 mg -prior Lexapro 15 mg  - She had considered therapy but doesn't think husband would be willing for couples counseling or her counselor- very controlling Depression screen [facility] 2/9 07/14/2021 06/17/2021 01/04/2021  Decreased Interest 0 0 1  Down, Depressed, Hopeless 0 0 1  PHQ - 2 Score 0 0 2  Altered sleeping 0 - 0  Tired, decreased energy 0 - 3  Change in appetite 0 - 0  Feeling bad or failure about yourself  0 - 0  Trouble concentrating 0 - 0  Moving slowly or fidgety/restless 0 - 0  Suicidal thoughts 0 - 0  PHQ-9 Score 0 - 5  Difficult doing work/chores Not difficult at all - Somewhat difficult  Some recent data might be hidden  A/P: full remission on current medicine- continue current regimen  #hypertension S: medication: hctz 25 mg - takes half tablet daily BP Readings from Last 3 Encounters:  07/14/21 110/64  06/17/21 110/68  01/04/21 126/80  A/P: Controlled.  Continue current medications.   # GERD S:Medication: pantoprazole 20 mg daily as significant flare-ups if she missed doses in the past. Also needs one pepcid a day -also Pepcid 20 mg twice daily over-the-counter trial failed in the past  B12 levels related to PPI use: Lab Results  Component Value Date   VITAMINB12 605 11/14/2019  A/P: stable- continue current meds- reasonable to continue 1 pepcid a day along with omeprazole but would want to refer to GI if worsens further   #Right Upper Quadrant Pain #fatty liver  - right upper quadrant ultrasound prior visit for potential gallstones- no gallstones found. Did have some fatty liver but thankfully LFTs had been ok other than hair high alkaline phosphatase. Interestingly no recurrence off pain since  that time at last visit (did have some throbbing pain there a few weeks back but now controlled)- continued to monitor.  Lab Results  Component Value Date   ALT 22 11/23/2020   AST 20 11/23/2020   ALKPHOS 95 11/23/2020   BILITOT 0.4 11/23/2020  Focus on healthy eating/regular exercise for fatty liver- mild weight loss advised- last labs looked  #Elevated alkaline phosphatase- work-up reassuring with Dr. Loanne Drilling in endocrinology- later #s improved. Had extensive blood work. Planned was for 1-year repeat visit with endocrine - not currently scheduled- patient preferred not to return unless worsened. -she prefers to do labs today and come back if elevated nonfasting  #trigger fingers intermittently- had joint flex using at [facility] and found helpful- plans to restart  #senile purpura- noted. Stable. Check cbc at least annually   #HM due for and ok with doing-  Health Maintenance Due  Topic Date Due   Fecal DNA (Cologuard)  03/21/2021   Recommended follow up: Return in about 6 months (around 01/14/2022) for follow up- or sooner if needed. Future Appointments  Date Time Provider [place]  09/20/2021  2:15 PM [facility] CCM PHARMACIST  [facility]  06/30/2022  2:30 PM [facility] HEALTH COACH [facility]    Lab/Order associations:   ICD-10-CM   1. Essential hypertension  I10     2. Hyperlipidemia, unspecified hyperlipidemia type  E78.5 CBC with Differential/Platelet    Comprehensive metabolic panel    Lipid panel    3. Depression, major, single episode, moderate (HCC)  F32.1     4. Gastroesophageal reflux disease without esophagitis  K21.9     5. Alkaline phosphatase elevation  R74.8     6. Right hemiplegia (HCC) Chronic G81.91     7. Senile purpura (HCC) Chronic D69.2     8. Screening for colorectal cancer  Z12.11 Cologuard   Z12.12     9. High risk medication use  Z79.899 Vitamin B12     No orders of the defined types were placed in this encounter.  I,Jada Bradford,acting as a scribe for Garret Reddish, MD.,have documented all relevant documentation on the behalf of Garret Reddish, MD,as directed by  Garret Reddish, MD while in the presence of Garret Reddish, MD.  I, Garret Reddish, MD, have reviewed all documentation for this visit. The documentation on 07/14/21 for the exam, diagnosis, procedures, and orders are all accurate and complete.  Return precautions advised.  Garret Reddish, MD

## 2021-07-12 ENCOUNTER — Ambulatory Visit: Payer: Medicare Other | Admitting: Family Medicine

## 2021-07-14 ENCOUNTER — Other Ambulatory Visit: Payer: Self-pay

## 2021-07-14 ENCOUNTER — Ambulatory Visit (INDEPENDENT_AMBULATORY_CARE_PROVIDER_SITE_OTHER): Payer: Medicare Other | Admitting: Family Medicine

## 2021-07-14 ENCOUNTER — Encounter: Payer: Self-pay | Admitting: Family Medicine

## 2021-07-14 VITALS — BP 110/64 | HR 71 | Temp 98.6°F | Ht 62.0 in | Wt 150.4 lb

## 2021-07-14 DIAGNOSIS — D692 Other nonthrombocytopenic purpura: Secondary | ICD-10-CM | POA: Diagnosis not present

## 2021-07-14 DIAGNOSIS — F321 Major depressive disorder, single episode, moderate: Secondary | ICD-10-CM

## 2021-07-14 DIAGNOSIS — Z1211 Encounter for screening for malignant neoplasm of colon: Secondary | ICD-10-CM

## 2021-07-14 DIAGNOSIS — Z1212 Encounter for screening for malignant neoplasm of rectum: Secondary | ICD-10-CM | POA: Diagnosis not present

## 2021-07-14 DIAGNOSIS — E785 Hyperlipidemia, unspecified: Secondary | ICD-10-CM | POA: Diagnosis not present

## 2021-07-14 DIAGNOSIS — R748 Abnormal levels of other serum enzymes: Secondary | ICD-10-CM | POA: Diagnosis not present

## 2021-07-14 DIAGNOSIS — Z79899 Other long term (current) drug therapy: Secondary | ICD-10-CM | POA: Diagnosis not present

## 2021-07-14 DIAGNOSIS — G8191 Hemiplegia, unspecified affecting right dominant side: Secondary | ICD-10-CM

## 2021-07-14 DIAGNOSIS — K219 Gastro-esophageal reflux disease without esophagitis: Secondary | ICD-10-CM

## 2021-07-14 DIAGNOSIS — I10 Essential (primary) hypertension: Secondary | ICD-10-CM

## 2021-07-14 NOTE — Patient Instructions (Addendum)
Let us know if you do not receive cologuard within 3 weeks ? ?Please stop by lab before you go ?If you have mychart- we will send your results within 3 business days of [place] receiving them.  ?If you do not have mychart- we will call you about results within 5 business days of [place] receiving them.  ?*please also note that you will see labs on mychart as soon as they post. I will later go in and write notes on them- will say "notes from Dr. Yong Channel"  ? ?Recommended follow up: Return in about 6 months (around 01/14/2022) for follow up- or sooner if needed. ?

## 2021-07-15 ENCOUNTER — Other Ambulatory Visit: Payer: Medicare Other

## 2021-07-15 ENCOUNTER — Telehealth: Payer: Self-pay | Admitting: Family Medicine

## 2021-07-15 ENCOUNTER — Encounter: Payer: Self-pay | Admitting: Family Medicine

## 2021-07-15 ENCOUNTER — Other Ambulatory Visit (INDEPENDENT_AMBULATORY_CARE_PROVIDER_SITE_OTHER): Payer: Medicare Other

## 2021-07-15 DIAGNOSIS — E785 Hyperlipidemia, unspecified: Secondary | ICD-10-CM | POA: Diagnosis not present

## 2021-07-15 DIAGNOSIS — Z79899 Other long term (current) drug therapy: Secondary | ICD-10-CM

## 2021-07-15 LAB — COMPREHENSIVE METABOLIC PANEL
ALT: 26 U/L (ref 0–35)
AST: 22 U/L (ref 0–37)
Albumin: 4.1 g/dL (ref 3.5–5.2)
Alkaline Phosphatase: 103 U/L (ref 39–117)
BUN: 10 mg/dL (ref 6–23)
CO2: 30 mEq/L (ref 19–32)
Calcium: 9.1 mg/dL (ref 8.4–10.5)
Chloride: 105 mEq/L (ref 96–112)
Creatinine, Ser: 0.64 mg/dL (ref 0.40–1.20)
GFR: 90.65 mL/min (ref >60.00)
Glucose, Bld: 96 mg/dL (ref 70–99)
Potassium: 3.1 mEq/L — ABNORMAL LOW (ref 3.5–5.1)
Sodium: 143 mEq/L (ref 135–145)
Total Bilirubin: 0.5 mg/dL (ref 0.2–1.2)
Total Protein: 6.9 g/dL (ref 6.0–8.3)

## 2021-07-15 LAB — CBC WITH DIFFERENTIAL/PLATELET
Basophils Absolute: 0 10*3/uL (ref 0.0–0.1)
Basophils Relative: 0.5 % (ref 0.0–3.0)
Eosinophils Absolute: 0.2 10*3/uL (ref 0.0–0.7)
Eosinophils Relative: 3.2 % (ref 0.0–5.0)
HCT: 42.6 % (ref 36.0–46.0)
Hemoglobin: 14.5 g/dL (ref 12.0–15.0)
Lymphocytes Relative: 23.5 % (ref 12.0–46.0)
Lymphs Abs: 1.3 10*3/uL (ref 0.7–4.0)
MCHC: 34 g/dL (ref 30.0–36.0)
MCV: 89.4 fl (ref 78.0–100.0)
Monocytes Absolute: 0.4 10*3/uL (ref 0.1–1.0)
Monocytes Relative: 8.2 % (ref 3.0–12.0)
Neutro Abs: 3.5 10*3/uL (ref 1.4–7.7)
Neutrophils Relative %: 64.6 % (ref 43.0–77.0)
Platelets: 261 10*3/uL (ref 150.0–400.0)
RBC: 4.77 Mil/uL (ref 3.87–5.11)
RDW: 13.5 % (ref 11.5–15.5)
WBC: 5.4 10*3/uL (ref 4.0–10.5)

## 2021-07-15 LAB — LIPID PANEL
Cholesterol: 117 mg/dL (ref 0–200)
HDL: 46.9 mg/dL (ref >39.00)
LDL Cholesterol: 49 mg/dL (ref 0–99)
NonHDL: 69.62
Total CHOL/HDL Ratio: 2
Triglycerides: 103 mg/dL (ref 0.0–149.0)
VLDL: 20.6 mg/dL (ref 0.0–40.0)

## 2021-07-15 LAB — VITAMIN B12: Vitamin B-12: 733 pg/mL (ref 211–911)

## 2021-07-15 NOTE — Telephone Encounter (Signed)
[facility] wanted to check for pt to make sure it would be okay if she could get her labs done here instead. Please advise, and I can give her a call and let her know.  ?

## 2021-07-15 NOTE — Telephone Encounter (Signed)
[place] office called pt back. Pt has decided to go to [place] lab.  I messaged PCP's CMA and she will change lab orders for future and [facility] phlebotomy collect so pt can complete at [facility]. ?

## 2021-07-15 NOTE — Addendum Note (Signed)
Addended by: Clyde Lundborg A on: 07/15/2021 01:49 PM ? ? Modules accepted: Orders ? ?

## 2021-07-18 ENCOUNTER — Other Ambulatory Visit: Payer: Self-pay | Admitting: Family Medicine

## 2021-07-18 MED ORDER — POTASSIUM CHLORIDE CRYS ER 20 MEQ PO TBCR: EXTENDED_RELEASE_TABLET | ORAL | 0 refills | Status: DC

## 2021-07-30 DIAGNOSIS — Z20822 Contact with and (suspected) exposure to covid-19: Secondary | ICD-10-CM | POA: Diagnosis not present

## 2021-08-02 DIAGNOSIS — Z1211 Encounter for screening for malignant neoplasm of colon: Secondary | ICD-10-CM | POA: Diagnosis not present

## 2021-08-02 DIAGNOSIS — Z1212 Encounter for screening for malignant neoplasm of rectum: Secondary | ICD-10-CM | POA: Diagnosis not present

## 2021-08-10 ENCOUNTER — Other Ambulatory Visit: Payer: Self-pay | Admitting: Family Medicine

## 2021-08-10 LAB — COLOGUARD: COLOGUARD: NEGATIVE

## 2021-08-23 ENCOUNTER — Telehealth: Payer: Self-pay | Admitting: Family Medicine

## 2021-08-23 NOTE — Telephone Encounter (Signed)
Patient was called in potasium. Patient has finished 1 bottle.  Patient received new instructions and is confused. Would like a call from MA or Dr Yong Channel to clarify instructions.  ?

## 2021-08-24 NOTE — Telephone Encounter (Signed)
Pt returned call and questions answered regarding potassium.  ?

## 2021-08-24 NOTE — Telephone Encounter (Signed)
Called and lm for pt tcb. 

## 2021-09-09 ENCOUNTER — Other Ambulatory Visit: Payer: Self-pay | Admitting: Family Medicine

## 2021-09-11 ENCOUNTER — Other Ambulatory Visit: Payer: Self-pay | Admitting: Family Medicine

## 2021-09-16 NOTE — Progress Notes (Signed)
? ? ?Chronic Care Management ?Pharmacy Note ? ?Summary: Patient doing well.  She will be moved to prn follow up as she is well controlled with all chronic conditions at this time.  Of note, she did take the potassium supplement for 30 days as she did not get the message to only take for 4 days until it was too late. ? ?09/20/2021 ?Name:  Brianna Simon MRN:  [id number] DOB:  04/16/1951 ? ?Subjective: ?Brianna Simon is an 71 y.Simon. year old female who is a primary patient of Hunter, Stephen O, MD.  The CCM team was consulted for assistance with disease management and care coordination needs.   ? ?Engaged with patient by telephone for follow up visit in response to provider referral for pharmacy case management and/or care coordination services.  ? ?Consent to Services:  ?The patient was given the following information about Chronic Care Management services today, agreed to services, and gave verbal consent: 1. CCM service includes personalized support from designated clinical staff supervised by the primary care provider, including individualized plan of care and coordination with other care providers 2. 24/7 contact phone numbers for assistance for urgent and routine care needs. 3. Service will only be billed when office clinical staff spend 20 minutes or more in a month to coordinate care. 4. Only one practitioner may furnish and bill the service in a calendar month. 5.The patient may stop CCM services at any time (effective at the end of the month) by phone call to the office staff. 6. The patient will be responsible for cost sharing (co-pay) of up to 20% of the service fee (after annual deductible is met). Patient agreed to services and consent obtained. ? ?Patient Care Team: ?Hunter, Stephen O, MD as PCP - General (Family Medicine) ?Richardson, Kathy, MD as Consulting Physician (Obstetrics and Gynecology) ?Brianna Simon, DPM as Consulting Physician (Podiatry) ?Sethi, Pramod S, MD as Consulting  Physician (Neurology) ?[facility] (Ophthalmology) ?Davis, Christian L, RPH (Pharmacist) ? ?Recent office visits:  ?07/15/21 (Hunter) - Potassium elevated, take potassium chloride for 4 days then stop ?  ?Recent consult visits:  ?None recent ?  ?Hospital visits:  ?None in previous 6 months ? ?Objective: ? ?Lab Results  ?Component Value Date  ? CREATININE 0.64 07/15/2021  ? CREATININE 0.65 11/23/2020  ? CREATININE 0.67 05/24/2020  ? ? ?Lab Results  ?Component Value Date  ? HGBA1C 5.5 02/27/2015  ? ?Last diabetic Eye exam: No results found for: HMDIABEYEEXA  ?Last diabetic Foot exam: No results found for: HMDIABFOOTEX  ? ?   ?Component Value Date/Time  ? CHOL 117 07/15/2021 1420  ? TRIG 103.0 07/15/2021 1420  ? HDL 46.90 07/15/2021 1420  ? CHOLHDL 2 07/15/2021 1420  ? VLDL 20.6 07/15/2021 1420  ? LDLCALC 49 07/15/2021 1420  ? LDLCALC 74 03/01/2018 1529  ? LDLDIRECT 75 11/14/2019 1528  ? ? ? ?  Latest Ref Rng & Units 07/15/2021  ?  2:20 PM 11/23/2020  ?  3:47 PM 05/24/2020  ?  3:11 PM  ?Hepatic Function  ?Total Protein 6.0 - 8.3 g/dL 6.9   6.8   6.7    ?Albumin 3.5 - 5.2 g/dL 4.1   4.1   4.0    ?AST 0 - 37 U/L 22   20   21    ?ALT 0 - 35 U/L 26   22   24    ?Alk Phosphatase 39 - 117 U/L 103   95   98    ?  Total Bilirubin 0.2 - 1.2 mg/dL 0.5   0.4   0.4    ? ? ?Lab Results  ?Component Value Date/Time  ? TSH 0.694 10/27/2014 11:44 AM  ? TSH 0.49 05/31/2012 09:26 AM  ? ? ? ?  Latest Ref Rng & Units 07/15/2021  ?  2:20 PM 05/24/2020  ?  3:11 PM 12/25/2019  ?  3:19 PM  ?CBC  ?WBC 4.0 - 10.5 K/uL 5.4   5.3   4.8    ?Hemoglobin 12.0 - 15.0 g/dL 14.5   14.2   14.4    ?Hematocrit 36.0 - 46.0 % 42.6   42.4   43.5    ?Platelets 150.0 - 400.0 K/uL 261.0   270.0   284    ? ? ?Lab Results  ?Component Value Date/Time  ? VD25OH 41.65 05/06/2019 03:41 PM  ? ?Clinical ASCVD: Yes  ?The ASCVD Risk score (Arnett DK, et al., 2019) failed to calculate for the following reasons: ?  The patient has a prior MI or stroke diagnosis    ? ? ?Social History  ? ?Tobacco Use  ?Smoking Status Former  ? Packs/day: 2.00  ? Years: 30.00  ? Pack years: 60.00  ? Types: Cigarettes  ? Quit date: 03/17/2003  ? Years since quitting: 18.5  ?Smokeless Tobacco Never  ? ?BP Readings from Last 3 Encounters:  ?07/14/21 110/64  ?06/17/21 110/68  ?01/04/21 126/80  ? ?Pulse Readings from Last 3 Encounters:  ?07/14/21 71  ?06/17/21 75  ?01/04/21 78  ? ?Wt Readings from Last 3 Encounters:  ?07/14/21 150 lb 6.4 oz (68.2 kg)  ?06/17/21 150 lb (68 kg)  ?01/04/21 149 lb 3.2 oz (67.7 kg)  ? ? ?Assessment: Review of patient past medical history, allergies, medications, health status, including review of consultants reports, laboratory and other test data, was performed as part of comprehensive evaluation and provision of chronic care management services.  ? ?SDOH:  (Social Determinants of Health) assessments and interventions performed: Yes ? ? ?CCM Care Plan ? ?Allergies  ?Allergen Reactions  ? Citalopram Hydrobromide   ?  REACTION: blurred vision  ? Codeine Phosphate   ?  Liquid formulation only bothers her; tolerates percocet fine.  ?REACTION: nausea,vomiting  ? Erythromycin   ?  Other reaction(Simon): Unknown  ? Nitrofurantoin   ?  REACTION: itching  ? Nsaids Other (See Comments)  ?  Elevated liver panel  ? Sulfamethoxazole   ?  Other reaction(Simon): Unknown  ? Sulfamethoxazole-Trimethoprim   ?  Bactrim/REACTION: itching  ? ? ?Medications Reviewed Today   ? ? Reviewed by Davis, Christian L, RPH (Pharmacist) on 09/20/21 at 1429  Med List Status: <None>  ? ?Medication Order Taking? Sig Documenting Provider Last Dose Status Informant  ?azelastine (ASTELIN) 0.1 % nasal spray [id number] Yes daily. [provider] Taking Active   ?CALCIUM PO [id number] Yes Take by mouth. [provider] Taking Active   ?cholecalciferol (VITAMIN D) 1000 units tablet [id number] Yes Take 2,000 Units by mouth daily.  [provider] Taking Active Spouse/Significant Other  ?          ?Med Note (ZELLMER, JENNIFER M   Tue Nov 05, 2018  2:48 PM) Pt currently takes 2000 IU daily  ?clopidogrel (PLAVIX) 75 MG tablet [id number] Yes Take 1 tablet (75 mg total) by mouth daily. Hunter, Stephen O, MD Taking Active   ?desvenlafaxine (PRISTIQ) 50 MG 24 hr tablet [id number] Yes TAKE 1 TABLET BY MOUTH EVERY DAY Hunter, Stephen O, MD Taking Active   ?  famotidine (PEPCID) 10 MG tablet [id number] Yes Take 10 mg by mouth as needed for heartburn or indigestion. [provider] Taking Active   ?hydrochlorothiazide (HYDRODIURIL) 25 MG tablet [id number] Yes Take 0.5 tablets (12.5 mg total) by mouth daily. Marin Olp, MD Taking Active   ?ipratropium (ATROVENT) 0.06 % nasal spray [id number] Yes INSTILL 2 SPRAYS IN EACH NOSTRIL 3 TIMES DAILY AS NEEDED [provider] Taking Active   ?levocetirizine (XYZAL) 5 MG tablet [id number] Yes Take 5 mg by mouth every morning. [provider] Taking Active   ?Melatonin 1 MG TABS [id number] Yes Take 5 mg by mouth daily. [provider] Taking Active   ?Multiple Vitamin (MULTIVITAMIN) tablet [id number] Yes Take 1 tablet by mouth daily. [provider] Taking Active Self  ?pantoprazole (PROTONIX) 20 MG tablet [id number] Yes Take 1 tablet (20 mg total) by mouth daily. Marin Olp, MD Taking Active   ?potassium chloride SA (KLOR-CON M20) 20 MEQ tablet [id number] No TAKE 1 TABLET BY MOUTH EVERY DAY AS NEEDED PER DR. HUNTER'Simon INSTRUCTIONS (KEEP EXCESS)  ?Patient not taking: Reported on 09/20/2021  ? Marin Olp, MD Not Taking Active   ?rosuvastatin (CRESTOR) 40 MG tablet [id number] Yes Take 1 tablet (40 mg total) by mouth daily. Marin Olp, MD Taking Active   ? ?  ?  ? ?  ? ? ?Patient Active Problem List  ? Diagnosis Date Noted  ? Fatty liver 05/24/2020  ? Depression, major, single episode, moderate ([place]) 11/14/2019  ? Alkaline phosphatase elevation 06/16/2019  ? Senile purpura ([place]) 11/05/2018  ? Hemiplegia affecting dominant  side, 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[place]) 03/01/2018  ? Osteopenia 10/02/2017  ? Benign paroxysmal positional vertigo 11/13/2016  ? Low back pain 09/26/2016  ? Dysarthria 03/02/2015  ? Former smoker 12/09/2014  ? Allergic rhinitis 06/14/

## 2021-09-20 ENCOUNTER — Ambulatory Visit: Payer: Medicare Other | Admitting: Pharmacist

## 2021-09-20 DIAGNOSIS — F321 Major depressive disorder, single episode, moderate: Secondary | ICD-10-CM

## 2021-09-20 DIAGNOSIS — E785 Hyperlipidemia, unspecified: Secondary | ICD-10-CM

## 2021-09-20 NOTE — Patient Instructions (Addendum)
Visit Information ? ? Goals Addressed   ? ?  ?  ?  ?  ? This Visit's Progress  ?  Prevent Falls and Broken Bones-Osteoporosis   On track  ?  Timeframe:  Long-Range Goal ?Priority:  High ?Start Date:   03/14/21                          ?Expected End Date:   04/31/23                   ? ?Follow Up Date 06/14/21  ?  ?- make an emergency alert plan in case I fall ?- pick up clutter from the floors ?- remove, or use a non-slip pad, with my throw rugs  ?  ?Why is this important?   ?When you fall, there are 3 things that control if a bone breaks or not.  ?These are the fall itself, how hard and the direction that you fall and how fragile your bones are.  ?Preventing falls is very important for you because of fragile bones.   ?  ?Notes:  ?  ? ?  ? ?Patient Care Plan: [place]  ?  ? ?Problem Identified: HLD HTN CVA Hx Depression Osteopenia   ?Priority: High  ?  ? ?Long-Range Goal: Disease Management   ?Start Date: 08/25/2020  ?Expected End Date: 08/25/2021  ?Recent Progress: On track  ?Priority: High  ?Note:   ?Current Barriers:  ?Maintain HLD control, minimal exercise, carb rich diet ? ?Pharmacist Clinical Goal(s):  ?Patient will contact provider office for questions/concerns as evidenced notation of same in electronic health record through collaboration with PharmD and provider.  ? ?Interventions: ?1:1 collaboration with Marin Olp, MD regarding development and update of comprehensive plan of care as evidenced by provider attestation and co-signature ?Inter-disciplinary care team collaboration (see longitudinal plan of care) ?Comprehensive medication review performed; medication list updated in electronic medical record ?No recommendations/changes ? ?Hypertension (BP goal <130/80) ?-Controlled ?-Current treatment: ?Hydrochlorothiazide 25 mg tablet - half tablet (12.5 mg) once daily  ?-Medications previously tried: n/a  ?-Current home readings: n/a ?-Denies hypotensive/hypertensive symptoms ?-Educated on  BP goals and benefits of medications for prevention of heart attack, stroke and kidney damage; ?Daily salt intake goal < 2300 mg; ?-Recommended to continue current medication ? ?Hyperlipidemia: (LDL goal < 70) ?-Controlled  ?-TG elevation 197 on 05/2020 (111 04/2019) ?-HTN, CVA Hx; former smoker 60 pack year hx (quit >15 years ago) ?-Current treatment: ?Rosuvastatin 40 mg once daily Appropriate, Effective, Safe, Accessible ?-Medications previously tried: atorvastatin 40 mg most recently, simvastatin 20 mg.  ?-CVA Hx, on Plavix alone.  ?-Current dietary patterns: frosted mini wheats, coffee. Ham sandwich. Alcohol - does not consume other than occasionally during the weekend.  ?-Current exercise habits: no formal routine, walking some with husband.  ?-Educated on Cholesterol goals;  ?Exercise goal of 150 minutes per week; ?-Recommended to continue current medication ?Agreed on inital exercise goal of walking 2-3x/week; minimize carb rich foods such as bread, rice, crackers  ? ?Update 09/20/21 ?Most recent lipid panel is well controlled ?Continues to be adherent with statin. ?No need for changes at this time.  ?Medications well controlled. ? ?Depression (Goal: ensure safe medication use) ?-Controlled ?-Current treatment: ?Pristiq '50mg'$  ER daily ?-Medications previously tried/failed: Zoloft 100 mg once daily, Lexapro  ?-Educated on Benefits of medication for symptom control ?-Recommended to continue current medication ?Declining any additional support at this time ? ?Update 03/14/21 ?  Switched to [employer] from [employer].  Reports that it is working well. ?PHQ9 SCORE ONLY 01/04/2021 11/23/2020 06/11/2020  ?PHQ-9 Total Score '5 11 1  '$ ?PHQ 9 continues to improve. ?Continue current meds for now, no issues with copay. ? ?Update 09/20/21 ?Pristiq still working well. ? ?  07/14/2021  ?  4:00 PM 06/17/2021  ?  2:38 PM 01/04/2021  ?  3:02 PM  ?PHQ9 SCORE ONLY  ?PHQ-9 Total Score 0 0 5  ? ?Medication is affordable and mood stable. ?No need for  further changes at this time. ? ?Osteopenia Goal ensure appropriate supplementation, exercise) ?-Controlled ?-Last DEXA Scan: 09/2017 - Osteopenia ?-VitD 42 (04/2019)  ?-SSRI PPI use ?-Patient is not a candidate for pharmacologic treatment ?-Current treatment  ?Calcium - none at present ?Vitamin D3 2000 units ?-Recommend 1200 mg of calcium daily from dietary and supplemental sources. Recommend weight-bearing and muscle strengthening exercises for building and maintaining bone density. ? ?Update 03/14/21 ?Patient last DEXA was 09/2017 and it showed osteopenia.  Recommended she get updated scan.  She usually works with her gynecologist on this, has appointment there next month - will discuss this at that time. ?At this time continue current management, continue to pursue muscle strengthening exercises. ? ?GERD (Goal: minimize symptoms) ?Some occasional breakthrough acid reflux, identifies tomato sauce as common trigger, somewhat aggravated by coffee as well.  ?-Not ideally controlled ?-B12 605 (11/2019) ?-Current treatment  ?Pantoprazole 20 mg once daily  ?-Medications previously tried: Cimetidine, famotidine, pantoprazole 40 mg, zantac, lansoprazole   ?-Recommended to continue current medication, patient will supplement breakthrough reflux with prn Pepcid 10-20 mg once or twice daily as needed ? ?Patient Goals/Self-Care Activities ?Patient will:  ?- take medications as prescribed ?target a minimum of 150 minutes of moderate intensity exercise weekly  ?supplement breakthrough reflux with prn Pepcid 10-20 mg once or twice daily  ? ?Medication Assistance: None required.  Patient affirms current coverage meets needs. ?Patient's preferred pharmacy is: ? ?[id number] [place], [place] - [address] ?[address] ?[facility] [place] [address]?Phone: [phone]: [phone]? ?Follow Up:  Patient agrees to Care Plan and Follow-up. ?Plan: FU prn from now on ?  ? ?  ? ?  ?  ? ?The patient verbalized understanding  of instructions, educational materials, and care plan provided today and declined offer to receive copy of patient instructions, educational materials, and care plan.  ?Telephone follow up appointment with pharmacy team member scheduled for: as needed follow up ? ?CEdythe Clarity [facility] ?CBeverly Milch PharmD ?Clinical Pharmacist  ?LOrvan July?([phone]? ?

## 2021-11-30 ENCOUNTER — Other Ambulatory Visit: Payer: Self-pay | Admitting: Family Medicine

## 2021-11-30 DIAGNOSIS — I639 Cerebral infarction, unspecified: Secondary | ICD-10-CM

## 2022-01-17 ENCOUNTER — Ambulatory Visit (INDEPENDENT_AMBULATORY_CARE_PROVIDER_SITE_OTHER): Payer: Medicare Other | Admitting: Family Medicine

## 2022-01-17 ENCOUNTER — Encounter: Payer: Self-pay | Admitting: Family Medicine

## 2022-01-17 VITALS — BP 102/70 | HR 72 | Temp 98.3°F | Ht 62.0 in | Wt 148.6 lb

## 2022-01-17 DIAGNOSIS — I10 Essential (primary) hypertension: Secondary | ICD-10-CM

## 2022-01-17 DIAGNOSIS — I639 Cerebral infarction, unspecified: Secondary | ICD-10-CM | POA: Diagnosis not present

## 2022-01-17 DIAGNOSIS — K760 Fatty (change of) liver, not elsewhere classified: Secondary | ICD-10-CM

## 2022-01-17 DIAGNOSIS — E785 Hyperlipidemia, unspecified: Secondary | ICD-10-CM | POA: Diagnosis not present

## 2022-01-17 MED ORDER — DESVENLAFAXINE SUCCINATE ER 100 MG PO TB24: 100.0000 mg | ORAL_TABLET | Freq: Every day | ORAL | 11 refills | Status: DC

## 2022-01-17 NOTE — Patient Instructions (Addendum)
Flu shot (high dose) we should have these available within a month or two but please let us know if you get at outside pharmacy -RSV reasonable as well -consider covid shot in October  Trial Pristiq 100 mg-if any thoughts of self-harm or if any worsening depression please see [place] sooner than 6 months  Please stop by lab before you go If you have mychart- we will send your results within 3 business days of [place] receiving them.  If you do not have mychart- we will call you about results within 5 business days of [place] receiving them.  *please also note that you will see labs on mychart as soon as they post. I will later go in and write notes on them- will say "notes from Dr. Yong Channel"   Recommended follow up: Return in about 6 months (around 07/18/2022) for followup or sooner if needed.Schedule b4 you leave.

## 2022-01-17 NOTE — Progress Notes (Signed)
Phone [phone] In person visit   Subjective:   Brianna Simon is a 71 y.o. year old very pleasant female patient who presents for/with See problem oriented charting Chief Complaint  Patient presents with   Follow-up   Hypertension   Hyperlipidemia    Past Medical History-  Patient Active Problem List   Diagnosis Date Noted   History of CVA (cerebrovascular accident) 02/22/2010    Priority: High   Fatty liver 05/24/2020    Priority: Medium    Depression, major, single episode, complete remission ([place]) 11/14/2019    Priority: Medium    Hemiplegia affecting dominant side, 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[place]) 03/01/2018    Priority: Medium    Osteopenia 10/02/2017    Priority: Medium    Former smoker 12/09/2014    Priority: Medium    Anxiety state 10/27/2014    Priority: Medium    Hyperlipidemia 02/22/2010    Priority: Medium    Essential hypertension 12/07/2006    Priority: Medium    Senile purpura ([place]) 11/05/2018    Priority: Low   Allergic rhinitis 10/27/2014    Priority: Low   Esophageal reflux 12/31/2006    Priority: Low   Insomnia 12/31/2006    Priority: Low   MVP (mitral valve prolapse) 12/10/2006    Priority: Low   Alkaline phosphatase elevation 06/16/2019   Benign paroxysmal positional vertigo 11/13/2016   Low back pain 09/26/2016   Dysarthria 03/02/2015    Medications- reviewed and updated Current Outpatient Medications  Medication Sig Dispense Refill   azelastine (ASTELIN) 0.1 % nasal spray daily.  5   CALCIUM PO Take by mouth.     cholecalciferol (VITAMIN D) 1000 units tablet Take 2,000 Units by mouth daily.      clopidogrel (PLAVIX) 75 MG tablet TAKE 1 TABLET BY MOUTH EVERY DAY 90 tablet 3   famotidine (PEPCID) 10 MG tablet Take 10 mg by mouth as needed for heartburn or indigestion.     hydrochlorothiazide (HYDRODIURIL) 25 MG tablet TAKE 1/2 TABLET BY MOUTH EVERY DAY 45 tablet 3   ipratropium (ATROVENT) 0.06 % nasal spray INSTILL 2 SPRAYS IN EACH  NOSTRIL 3 TIMES DAILY AS NEEDED  5   levocetirizine (XYZAL) 5 MG tablet Take 5 mg by mouth every morning.     Melatonin 1 MG TABS Take 5 mg by mouth daily.     Multiple Vitamin (MULTIVITAMIN) tablet Take 1 tablet by mouth daily.     pantoprazole (PROTONIX) 20 MG tablet TAKE 1 TABLET BY MOUTH EVERY DAY 90 tablet 3   potassium chloride SA (KLOR-CON M20) 20 MEQ tablet TAKE 1 TABLET BY MOUTH EVERY DAY AS NEEDED PER DR. [place]'S INSTRUCTIONS (KEEP EXCESS) 30 tablet 0   rosuvastatin (CRESTOR) 40 MG tablet TAKE 1 TABLET BY MOUTH EVERY DAY 90 tablet 3   desvenlafaxine (PRISTIQ) 100 MG 24 hr tablet Take 1 tablet (100 mg total) by mouth daily. 30 tablet 11   No current facility-administered medications for this visit.     Objective:  BP 102/70   Pulse 72   Temp 98.3 F (36.8 C)   Ht '5\' 2"'$  (1.575 m)   Wt 148 lb 9.6 oz (67.4 kg)   LMP  (LMP Unknown)   SpO2 96%   BMI 27.18 kg/m  Gen: NAD, resting comfortably CV: RRR no murmurs rubs or gallops Lungs: CTAB no crackles, wheeze, rhonchi Ext: no edema Skin: warm, dry    Assessment and Plan   #social update- brother with cancer- apparently agent orange related- she  is trying to be supportive  #History of CVA with residual right hemiplegia (numbness mainly right hand with some trouble picking things up as a result- no weakness reported) with loop recorder in place showing no evidence of atrial fibrillation #hyperlipidemia S: Medication: rosuvastatin 40 mg, Plavix 75 mg. No aspirin Lab Results  Component Value Date   CHOL 117 07/15/2021   HDL 46.90 07/15/2021   LDLCALC 49 07/15/2021   LDLDIRECT 75 11/14/2019   TRIG 103.0 07/15/2021   CHOLHDL 2 07/15/2021  A/P: stroke history noted- remain on statin and plavix for reduction For hld- LDL goal under 70- well controlled just 6 months ago- check next visit  # Depression/Anxiety S: Medication: pristiq 50 mg -prior Lexapro 15 mg  -sleep issues since menopause - encouraged therapy again- has  tried to have healthy boundaries again    01/17/2022    3:25 PM 07/14/2021    4:00 PM 06/17/2021    2:38 PM  Depression screen PHQ 2/9  Decreased Interest 0 0 0  Down, Depressed, Hopeless 0 0 0  PHQ - 2 Score 0 0 0  Altered sleeping 3 0   Tired, decreased energy 0 0   Change in appetite 0 0   Feeling bad or failure about yourself  0 0   Trouble concentrating 0 0   Moving slowly or fidgety/restless 0 0   Suicidal thoughts 0 0   PHQ-9 Score 3 0   Difficult doing work/chores Not difficult at all Not difficult at all   A/P: scores are well controlled but she would like to see if higher dose would help her deal with frustrations on home front more- increased dose today  #hypertension S: medication: hctz 25 mg - takes half tablet daily BP Readings from Last 3 Encounters:  01/17/22 102/70  07/14/21 110/64  06/17/21 110/68  A/P: Controlled. Continue current medications.  - if stays this low may even stop medication - did have to take potassium last visit with #s at 3.1   #Right Upper Quadrant Pain #fatty liver -discovered related to RUQ pain- but pain improved- healthy eating/regular exercise recommended Lab Results  Component Value Date   ALT 26 07/15/2021   AST 22 07/15/2021   ALKPHOS 103 07/15/2021   BILITOT 0.5 07/15/2021   Recommended follow up: Return in about 6 months (around 07/18/2022) for followup or sooner if needed.Schedule b4 you leave. Future Appointments  Date Time Provider [place]  06/30/2022  2:30 PM [facility]   Lab/Order associations:   ICD-10-CM   1. Hyperlipidemia, unspecified hyperlipidemia type  E78.5     2. Essential hypertension  I10     3. Fatty liver  K76.0       Meds ordered this encounter  Medications   desvenlafaxine (PRISTIQ) 100 MG 24 hr tablet    Sig: Take 1 tablet (100 mg total) by mouth daily.    Dispense:  30 tablet    Refill:  11    Return precautions advised.  Garret Reddish, MD

## 2022-01-18 LAB — COMPREHENSIVE METABOLIC PANEL
ALT: 22 U/L (ref 0–35)
AST: 22 U/L (ref 0–37)
Albumin: 3.8 g/dL (ref 3.5–5.2)
Alkaline Phosphatase: 99 U/L (ref 39–117)
BUN: 13 mg/dL (ref 6–23)
CO2: 31 mEq/L (ref 19–32)
Calcium: 9.1 mg/dL (ref 8.4–10.5)
Chloride: 102 mEq/L (ref 96–112)
Creatinine, Ser: 0.63 mg/dL (ref 0.40–1.20)
GFR: 90.67 mL/min (ref >60.00)
Glucose, Bld: 72 mg/dL (ref 70–99)
Potassium: 3.3 mEq/L — ABNORMAL LOW (ref 3.5–5.1)
Sodium: 139 mEq/L (ref 135–145)
Total Bilirubin: 0.3 mg/dL (ref 0.2–1.2)
Total Protein: 6.9 g/dL (ref 6.0–8.3)

## 2022-01-18 MED ORDER — POTASSIUM CHLORIDE CRYS ER 10 MEQ PO TBCR: 10.0000 meq | EXTENDED_RELEASE_TABLET | ORAL | 3 refills | Status: DC

## 2022-01-18 NOTE — Addendum Note (Signed)
Addended by: Marin Olp on: 01/18/2022 05:47 PM   Modules accepted: Orders

## 2022-02-08 ENCOUNTER — Telehealth: Payer: Self-pay | Admitting: Family Medicine

## 2022-02-08 NOTE — Telephone Encounter (Signed)
Patient requests to be called at ph# [phone] to be advised for the following:  Patient states she having dental work done on 02/21/22 and that Dentist told Patient to off any blood thinners for 2 days prior to dental work.  Patient states she wants to make Dr. Yong Channel agrees that Patient should go off blood thinners for 2 days.

## 2022-02-09 NOTE — Telephone Encounter (Signed)
Called and lm on pt vm.

## 2022-02-09 NOTE — Telephone Encounter (Signed)
Slightly increases stroke risk but still reasonable to hold

## 2022-02-10 ENCOUNTER — Other Ambulatory Visit: Payer: Self-pay | Admitting: Family Medicine

## 2022-02-13 ENCOUNTER — Telehealth: Payer: Self-pay

## 2022-02-13 NOTE — Telephone Encounter (Signed)
Received medical clearance pw from [facility]. Dr. Leonie Man reviewed and signed off on this request.  I have faxed back with confirmation received.

## 2022-02-14 ENCOUNTER — Telehealth: Payer: Self-pay | Admitting: Family Medicine

## 2022-02-14 ENCOUNTER — Other Ambulatory Visit: Payer: Self-pay | Admitting: Obstetrics and Gynecology

## 2022-02-14 DIAGNOSIS — Z1231 Encounter for screening mammogram for malignant neoplasm of breast: Secondary | ICD-10-CM

## 2022-02-14 NOTE — Telephone Encounter (Signed)
Patient states she received flu shot at [facility] on 01/30/22

## 2022-02-14 NOTE — Telephone Encounter (Signed)
Flu shot documented

## 2022-03-08 DIAGNOSIS — J301 Allergic rhinitis due to pollen: Secondary | ICD-10-CM | POA: Diagnosis not present

## 2022-03-08 DIAGNOSIS — J300 Vasomotor rhinitis: Secondary | ICD-10-CM | POA: Diagnosis not present

## 2022-03-08 DIAGNOSIS — J3089 Other allergic rhinitis: Secondary | ICD-10-CM | POA: Diagnosis not present

## 2022-03-10 ENCOUNTER — Ambulatory Visit
Admission: RE | Admit: 2022-03-10 | Discharge: 2022-03-10 | Disposition: A | Discharge status: Home / Self Care | Payer: Medicare Other | Source: Ambulatory Visit | Attending: Obstetrics and Gynecology | Admitting: Obstetrics and Gynecology

## 2022-03-10 DIAGNOSIS — Z1231 Encounter for screening mammogram for malignant neoplasm of breast: Secondary | ICD-10-CM | POA: Diagnosis not present

## 2022-06-08 DIAGNOSIS — D485 Neoplasm of uncertain behavior of skin: Secondary | ICD-10-CM | POA: Diagnosis not present

## 2022-06-08 DIAGNOSIS — L570 Actinic keratosis: Secondary | ICD-10-CM | POA: Diagnosis not present

## 2022-06-08 DIAGNOSIS — D2262 Melanocytic nevi of left upper limb, including shoulder: Secondary | ICD-10-CM | POA: Diagnosis not present

## 2022-06-08 DIAGNOSIS — L821 Other seborrheic keratosis: Secondary | ICD-10-CM | POA: Diagnosis not present

## 2022-06-08 DIAGNOSIS — L820 Inflamed seborrheic keratosis: Secondary | ICD-10-CM | POA: Diagnosis not present

## 2022-06-08 DIAGNOSIS — D225 Melanocytic nevi of trunk: Secondary | ICD-10-CM | POA: Diagnosis not present

## 2022-06-08 DIAGNOSIS — Z85828 Personal history of other malignant neoplasm of skin: Secondary | ICD-10-CM | POA: Diagnosis not present

## 2022-06-08 DIAGNOSIS — D2261 Melanocytic nevi of right upper limb, including shoulder: Secondary | ICD-10-CM | POA: Diagnosis not present

## 2022-06-08 DIAGNOSIS — D1801 Hemangioma of skin and subcutaneous tissue: Secondary | ICD-10-CM | POA: Diagnosis not present

## 2022-06-12 DIAGNOSIS — H40033 Anatomical narrow angle, bilateral: Secondary | ICD-10-CM | POA: Diagnosis not present

## 2022-06-20 ENCOUNTER — Ambulatory Visit (INDEPENDENT_AMBULATORY_CARE_PROVIDER_SITE_OTHER): Payer: Medicare Other

## 2022-06-20 VITALS — BP 110/68 | HR 80 | Temp 97.5°F | Wt 151.8 lb

## 2022-06-20 DIAGNOSIS — Z0000 Encounter for general adult medical examination without abnormal findings: Secondary | ICD-10-CM | POA: Diagnosis not present

## 2022-06-20 NOTE — Patient Instructions (Signed)
[name] , Thank you for taking time to come for your Medicare Wellness Visit. I appreciate your ongoing commitment to your health goals. Please review the following plan we discussed and let me know if I can assist you in the future.   These are the goals we discussed:  Goals      Patient Stated     Lose weight     Patient Stated     Lose weight      Prevent Falls and Broken Bones-Osteoporosis     Timeframe:  Long-Range Goal Priority:  High Start Date:   03/14/21                          Expected End Date:   04/31/23                    Follow Up Date 06/14/21    - make an emergency alert plan in case I fall - pick up clutter from the floors - remove, or use a non-slip pad, with my throw rugs    Why is this important?   When you fall, there are 3 things that control if a bone breaks or not.  These are the fall itself, how hard and the direction that you fall and how fragile your bones are.  Preventing falls is very important for you because of fragile bones.     Notes:         This is a list of the screening recommended for you and due dates:  Health Maintenance  Topic Date Due   COVID-19 Vaccine (7 - 2023-24 season) 01/13/2022   Medicare Annual Wellness Visit  06/21/2023   Mammogram  03/10/2024   Cologuard (Stool DNA test)  08/02/2024   DTaP/Tdap/Td vaccine (2 - Td or Tdap) 10/26/2024   Pneumonia Vaccine  Completed   Flu Shot  Completed   DEXA scan (bone density measurement)  Completed   Hepatitis C Screening: USPSTF Recommendation to screen - Ages 6-79 yo.  Completed   Zoster (Shingles) Vaccine  Completed   HPV Vaccine  Aged Out    Advanced directives: Please bring a copy of your health care power of attorney and living will to the office at your convenience.  Conditions/risks identified: lose some weight   Next appointment: Follow up in one year for your annual wellness visit    Preventive Care 65 Years and Older, Female Preventive care refers to  lifestyle choices and visits with your health care provider that can promote health and wellness. What does preventive care include? A yearly physical exam. This is also called an annual well check. Dental exams once or twice a year. Routine eye exams. Ask your health care provider how often you should have your eyes checked. Personal lifestyle choices, including: Daily care of your teeth and gums. Regular physical activity. Eating a healthy diet. Avoiding tobacco and drug use. Limiting alcohol use. Practicing safe sex. Taking low-dose aspirin every day. Taking vitamin and mineral supplements as recommended by your health care provider. What happens during an annual well check? The services and screenings done by your health care provider during your annual well check will depend on your age, overall health, lifestyle risk factors, and family history of disease. Counseling  Your health care provider may ask you questions about your: Alcohol use. Tobacco use. Drug use. Emotional well-being. Home and relationship well-being. Sexual activity. Eating habits. History of falls. Memory and ability  to understand (cognition). Work and work Statistician. Reproductive health. Screening  You may have the following tests or measurements: Height, weight, and BMI. Blood pressure. Lipid and cholesterol levels. These may be checked every 5 years, or more frequently if you are over 70 years old. Skin check. Lung cancer screening. You may have this screening every year starting at age 62 if you have a 30-pack-year history of smoking and currently smoke or have quit within the past 15 years. Fecal occult blood test (FOBT) of the stool. You may have this test every year starting at age 76. Flexible sigmoidoscopy or colonoscopy. You may have a sigmoidoscopy every 5 years or a colonoscopy every 10 years starting at age 47. Hepatitis C blood test. Hepatitis B blood test. Sexually transmitted disease  (STD) testing. Diabetes screening. This is done by checking your blood sugar (glucose) after you have not eaten for a while (fasting). You may have this done every 1-3 years. Bone density scan. This is done to screen for osteoporosis. You may have this done starting at age 87. Mammogram. This may be done every 1-2 years. Talk to your health care provider about how often you should have regular mammograms. Talk with your health care provider about your test results, treatment options, and if necessary, the need for more tests. Vaccines  Your health care provider may recommend certain vaccines, such as: Influenza vaccine. This is recommended every year. Tetanus, diphtheria, and acellular pertussis (Tdap, Td) vaccine. You may need a Td booster every 10 years. Zoster vaccine. You may need this after age 57. Pneumococcal 13-valent conjugate (PCV13) vaccine. One dose is recommended after age 63. Pneumococcal polysaccharide (PPSV23) vaccine. One dose is recommended after age 58. Talk to your health care provider about which screenings and vaccines you need and how often you need them. This information is not intended to replace advice given to you by your health care provider. Make sure you discuss any questions you have with your health care provider. Document Released: 05/28/2015 Document Revised: 01/19/2016 Document Reviewed: 03/02/2015 Elsevier Interactive Patient Education  2017 [place] Prevention in the Home Falls can cause injuries. They can happen to people of all ages. There are many things you can do to make your home safe and to help prevent falls. What can I do on the outside of my home? Regularly fix the edges of walkways and driveways and fix any cracks. Remove anything that might make you trip as you walk through a door, such as a raised step or threshold. Trim any bushes or trees on the path to your home. Use bright outdoor lighting. Clear any walking paths of anything  that might make someone trip, such as rocks or tools. Regularly check to see if handrails are loose or broken. Make sure that both sides of any steps have handrails. Any raised decks and porches should have guardrails on the edges. Have any leaves, snow, or ice cleared regularly. Use sand or salt on walking paths during winter. Clean up any spills in your garage right away. This includes oil or grease spills. What can I do in the bathroom? Use night lights. Install grab bars by the toilet and in the tub and shower. Do not use towel bars as grab bars. Use non-skid mats or decals in the tub or shower. If you need to sit down in the shower, use a plastic, non-slip stool. Keep the floor dry. Clean up any water that spills on the floor as soon as  it happens. Remove soap buildup in the tub or shower regularly. Attach bath mats securely with double-sided non-slip rug tape. Do not have throw rugs and other things on the floor that can make you trip. What can I do in the bedroom? Use night lights. Make sure that you have a light by your bed that is easy to reach. Do not use any sheets or blankets that are too big for your bed. They should not hang down onto the floor. Have a firm chair that has side arms. You can use this for support while you get dressed. Do not have throw rugs and other things on the floor that can make you trip. What can I do in the kitchen? Clean up any spills right away. Avoid walking on wet floors. Keep items that you use a lot in easy-to-reach places. If you need to reach something above you, use a strong step stool that has a grab bar. Keep electrical cords out of the way. Do not use floor polish or wax that makes floors slippery. If you must use wax, use non-skid floor wax. Do not have throw rugs and other things on the floor that can make you trip. What can I do with my stairs? Do not leave any items on the stairs. Make sure that there are handrails on both sides of  the stairs and use them. Fix handrails that are broken or loose. Make sure that handrails are as long as the stairways. Check any carpeting to make sure that it is firmly attached to the stairs. Fix any carpet that is loose or worn. Avoid having throw rugs at the top or bottom of the stairs. If you do have throw rugs, attach them to the floor with carpet tape. Make sure that you have a light switch at the top of the stairs and the bottom of the stairs. If you do not have them, ask someone to add them for you. What else can I do to help prevent falls? Wear shoes that: Do not have high heels. Have rubber bottoms. Are comfortable and fit you well. Are closed at the toe. Do not wear sandals. If you use a stepladder: Make sure that it is fully opened. Do not climb a closed stepladder. Make sure that both sides of the stepladder are locked into place. Ask someone to hold it for you, if possible. Clearly mark and make sure that you can see: Any grab bars or handrails. First and last steps. Where the edge of each step is. Use tools that help you move around (mobility aids) if they are needed. These include: Canes. Walkers. Scooters. Crutches. Turn on the lights when you go into a dark area. Replace any light bulbs as soon as they burn out. Set up your furniture so you have a clear path. Avoid moving your furniture around. If any of your floors are uneven, fix them. If there are any pets around you, be aware of where they are. Review your medicines with your doctor. Some medicines can make you feel dizzy. This can increase your chance of falling. Ask your doctor what other things that you can do to help prevent falls. This information is not intended to replace advice given to you by your health care provider. Make sure you discuss any questions you have with your health care provider. Document Released: 02/25/2009 Document Revised: 10/07/2015 Document Reviewed: 06/05/2014 Elsevier Interactive  Patient Education  2017 [employer].

## 2022-06-20 NOTE — Progress Notes (Deleted)
Subjective:   Brianna Simon is a 72 y.o. female who presents for Medicare Annual (Subsequent) preventive examination.  Review of Systems     Cardiac Risk Factors include: advanced age (>93mn, >>39women);dyslipidemia;hypertension     Objective:    Today's Vitals   06/20/22 1600  BP: 110/68  Pulse: 80  Temp: (!) 97.5 F (36.4 C)  SpO2: 92%  Weight: 151 lb 12.8 oz (68.9 kg)   Body mass index is 27.76 kg/m.     06/20/2022    4:11 PM 06/17/2021    2:39 PM 06/11/2020    2:43 PM 04/28/2019    3:28 PM 02/27/2015    8:50 AM 02/26/2015    8:00 PM 10/23/2013   11:59 AM  Advanced Directives  Does Patient Have a Medical Advance Directive? Yes Yes Yes Yes No No Patient does not have advance directive;Patient would not like information  Type of AScientist, forensicPower of AFish LakeLiving will Healthcare Power of A[place]land ParkLiving will Living will;Healthcare Power of Attorney     Does patient want to make changes to medical advance directive?    No - Patient declined     Copy of [place] Chart? No - copy requested No - copy requested No - copy requested No - copy requested     Pre-existing out of facility DNR order (yellow form or pink MOST form)       No    Current Medications (verified) Outpatient Encounter Medications as of 06/20/2022  Medication Sig   azelastine (ASTELIN) 0.1 % nasal spray daily.   CALCIUM PO Take by mouth.   cholecalciferol (VITAMIN D) 1000 units tablet Take 2,000 Units by mouth daily.    clopidogrel (PLAVIX) 75 MG tablet TAKE 1 TABLET BY MOUTH EVERY DAY   desvenlafaxine (PRISTIQ) 100 MG 24 hr tablet TAKE 1 TABLET BY MOUTH EVERY DAY   famotidine (PEPCID) 10 MG tablet Take 10 mg by mouth as needed for heartburn or indigestion.   hydrochlorothiazide (HYDRODIURIL) 25 MG tablet TAKE 1/2 TABLET BY MOUTH EVERY DAY   ipratropium (ATROVENT) 0.06 % nasal spray INSTILL 2 SPRAYS IN EACH NOSTRIL 3 TIMES DAILY  AS NEEDED   Melatonin 1 MG TABS Take 5 mg by mouth daily.   Multiple Vitamin (MULTIVITAMIN) tablet Take 1 tablet by mouth daily.   pantoprazole (PROTONIX) 20 MG tablet TAKE 1 TABLET BY MOUTH EVERY DAY   potassium chloride (KLOR-CON M) 10 MEQ tablet Take 1 tablet (10 mEq total) by mouth 2 (two) times a week.   rosuvastatin (CRESTOR) 40 MG tablet TAKE 1 TABLET BY MOUTH EVERY DAY   [DISCONTINUED] levocetirizine (XYZAL) 5 MG tablet Take 5 mg by mouth every morning.   No facility-administered encounter medications on file as of 06/20/2022.    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   SVD (spontaneous vaginal delivery)    x 1 - gave up for  adoption   Past Surgical History:  Procedure Laterality Date   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    COLONOSCOPY     DILATATION & CURETTAGE/HYSTEROSCOPY WITH TRUECLEAR N/A 10/24/2013   Procedure: DILATATION & CURETTAGE/HYSTEROSCOPY WITH TRUCLEAR;  Surgeon: Logan Bores, MD;  Location: [facility];  Service: Gynecology;  Laterality: N/A;  1 1/2hrs OR time   EP IMPLANTABLE DEVICE N/A 03/02/2015   Procedure: Loop Recorder Insertion;  Surgeon: Will Meredith Leeds, MD;  Location: [facility];  Service: Cardiovascular;  Laterality: N/A;   lipoma removed     right shoulder   SINUS SURGERY WITH INSTATRAK     WISDOM TOOTH EXTRACTION     Family History  Problem Relation Age of Onset   Arrhythmia Mother 18        pacemaker    Stroke Mother    Stroke Paternal Grandmother    Diverticulitis Other    Breast cancer Maternal Aunt    Breast cancer Cousin    Social History   Socioeconomic History   Marital status: Married    Spouse name: Not on file   Number of children: Not on file   Years of education: Not on file   Highest education level: Not on file  Occupational History   Occupation: [employer]    Employer: [employer]  Tobacco Use   Smoking status: Former    Packs/day: 2.00    Years: 30.00    Total pack years: 60.00    Types: Cigarettes    Quit date: 03/17/2003    Years since quitting: 19.2   Smokeless tobacco: Never  Substance and Sexual Activity   Alcohol use: Yes    Alcohol/week: 3.0 standard drinks of alcohol    Types: 1 Glasses of wine, 1 Cans of beer, 1 Shots of liquor per week    Comment: occasionally   Drug use: No   Sexual activity: Yes    Birth control/protection: Post-menopausal  Other Topics Concern   Not on file  Social History Narrative   Married. No children. Husband patient of Dr. 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       Hobbies: dancing, time out with husband, enjoys concerts   Social Determinants of Health   Financial Resource Strain: Low Risk  (06/19/2022)   Overall Financial Resource Strain (CARDIA)    Difficulty of Paying Living Expenses: Not very hard  Food Insecurity: No Food Insecurity (06/19/2022)   Hunger Vital Sign    Worried About Running Out of Food in the Last Year: Never true    Ran Out of Food in the Last Year: Never true  Transportation Needs: No Transportation Needs (06/17/2021)   PRAPARE - Hydrologist (Medical): No    Lack of Transportation (Non-Medical): No  Physical Activity: Unknown (06/19/2022)   Exercise Vital Sign    Days of Exercise per Week: 3 days    Minutes of Exercise per Session: Not on file  Stress: Stress Concern Present (06/19/2022)   [place]    Feeling of Stress : To some extent  Social Connections: Moderately Isolated (06/19/2022)   Social Connection and Isolation Panel [NHANES]    Frequency of Communication with Friends and Family: More than three times a week    Frequency of Social Gatherings with Friends and Family: More than three times a week  Attends Religious Services: Never    Active Member of Clubs or Organizations: No    Attends Archivist Meetings: Never    Marital Status: Married    Tobacco Counseling Counseling given: Not Answered   Clinical Intake:  Pre-visit preparation completed: Yes  Pain : No/denies pain     BMI - recorded: 27.76 Nutritional Status: BMI 25 -29 Overweight Nutritional Risks: None Diabetes: No  How often do you need to have someone help you when you read instructions, pamphlets, or other written materials from your doctor or pharmacy?: 1 - Never  Diabetic?no  Interpreter Needed?: No  Information entered by :: Charlott Rakes, LPN   Activities of Daily Living    06/19/2022    4:43 PM  In your present state of health, do you have any difficulty performing the following activities:  Hearing? 1  Comment wears hearing aids  Vision? 1  Comment seeing an eye dr related to glaucoma  Difficulty concentrating or making decisions? 0  Walking or climbing stairs? 0  Dressing or bathing? 0  Doing errands, shopping? 0  Preparing Food and eating ? N  Using the Toilet? N  In the past six months, have you accidently leaked urine? N  Do you have problems with loss of bowel control? N  Managing your Medications? N  Managing your Finances? N  Housekeeping or managing your Housekeeping? N  Comment pt stated no changes from early checkin    Patient Care Team: Marin Olp, MD as PCP - General (Family Medicine) Paula Compton, MD as Consulting Physician (Obstetrics and Gynecology) Evelina Bucy, DPM  (Inactive) as Consulting Physician (Podiatry) Garvin Fila, MD as Consulting Physician (Neurology) [facility] (Ophthalmology) Edythe Clarity, [facility] (Pharmacist)  Indicate any recent Medical Services you may have received from other than [facility] providers in the past year (date may be approximate).     Assessment:   This is a routine wellness examination for Brooks.  Hearing/Vision screen Hearing Screening - Comments:: Pt wears hearing aids  Vision Screening - Comments:: Pt follows up with Dr Ishmael Holter for eye exams   Dietary issues and exercise activities discussed: Current Exercise Habits: Home exercise routine, Type of exercise: walking, Time (Minutes): 20, Frequency (Times/Week): 5, Weekly Exercise (Minutes/Week): 100   Goals Addressed             This Visit's Progress    Patient Stated       Lose weight        Depression Screen    06/20/2022    4:10 PM 01/17/2022    3:25 PM 07/14/2021    4:00 PM 06/17/2021    2:38 PM 01/04/2021    3:02 PM 11/23/2020    3:13 PM 06/11/2020    2:41 PM  PHQ 2/9 Scores  PHQ - 2 Score 0 0 0 0 2 4 1  $ PHQ- 9 Score  3 0  5 11     Fall Risk    06/19/2022    4:43 PM 06/17/2021    2:40 PM 11/23/2020    3:12 PM 06/11/2020    2:44 PM 12/25/2019    2:34 PM  Fall Risk   Falls in the past year? 1 0 0 1 0  Number falls in past yr: 1 0 0 1 0  Injury with Fall? 0 0 0 0 0  Risk for fall due to : Impaired vision Impaired vision;Impaired balance/gait No Fall Risks Impaired vision;Impaired balance/gait;Impaired mobility   Follow  up Falls prevention discussed Falls prevention discussed Falls evaluation completed Falls prevention discussed     FALL RISK PREVENTION PERTAINING TO THE HOME:  Any stairs in or around the home? No  If so, are there any without handrails? No  Home free of loose throw rugs in walkways, pet beds, electrical cords, etc? Yes  Adequate lighting in your home to reduce risk of falls? Yes   ASSISTIVE DEVICES UTILIZED  TO PREVENT FALLS:  Life alert? Yes  Use of a cane, walker or w/c? No  Grab bars in the bathroom? Yes  Shower chair or bench in shower? Yes  Elevated toilet seat or a handicapped toilet? No   TIMED UP AND GO:  Was the test performed? Yes .  Length of time to ambulate 10 feet: 10 sec.   Gait steady and fast without use of assistive device  Cognitive Function:        06/20/2022    4:15 PM 06/17/2021    2:43 PM 06/11/2020    2:47 PM 04/28/2019    3:29 PM  6CIT Screen  What Year? 0 points 0 points 0 points 0 points  What month? 0 points 0 points 0 points 0 points  What time? 0 points 0 points  0 points  Count back from 20 0 points 0 points 0 points 0 points  Months in reverse 0 points 0 points 0 points 0 points  Repeat phrase 0 points 0 points 0 points 0 points  Total Score 0 points 0 points  0 points    Immunizations Immunization History  Administered Date(s) Administered   Fluad Quad(high Dose 65+) 03/04/2019, 03/18/2020, 02/24/2021, 01/30/2022   Influenza Split 03/17/2011, 02/09/2012   Influenza Whole 02/12/2008, 02/22/2010   Influenza, High Dose Seasonal PF 02/27/2018   Influenza,inj,Quad PF,6+ Mos 02/10/2013, 03/09/2014, 02/05/2015, 02/25/2016, 02/23/2017   Moderna Sars-Covid-2 Vaccination 04/09/2021   PFIZER Comirnaty(Gray Top)Covid-19 Tri-Sucrose Vaccine 09/10/2020   PFIZER(Purple Top)SARS-COV-2 Vaccination 06/20/2019, 07/11/2019, 03/01/2020, 09/10/2020   PNEUMOCOCCAL CONJUGATE-20 11/23/2020   Pneumococcal Conjugate-13 11/14/2019   Pneumococcal Polysaccharide-23 11/05/2018   Tdap 10/27/2014   Zoster Recombinat (Shingrix) 09/26/2016, 12/26/2016   Zoster, Live 04/03/2013    TDAP status: Up to date  Flu Vaccine status: Up to date  Pneumococcal vaccine status: Up to date  Covid-19 vaccine status: Completed vaccines  Qualifies for Shingles Vaccine? Yes   Zostavax completed Yes   Shingrix Completed?: Yes  Screening Tests Health Maintenance  Topic Date Due    COVID-19 Vaccine (7 - 2023-24 season) 01/13/2022   Medicare Annual Wellness (AWV)  06/21/2023   MAMMOGRAM  03/10/2024   Fecal DNA (Cologuard)  08/02/2024   DTaP/Tdap/Td (2 - Td or Tdap) 10/26/2024   Pneumonia Vaccine 24+ Years old  Completed   INFLUENZA VACCINE  Completed   DEXA SCAN  Completed   Hepatitis C Screening  Completed   Zoster Vaccines- Shingrix  Completed   HPV VACCINES  Aged Out    Health Maintenance  Health Maintenance Due  Topic Date Due   COVID-19 Vaccine (7 - 2023-24 season) 01/13/2022    Colorectal cancer screening: Type of screening: Cologuard. Completed 08/02/21. Repeat every 3 years  Mammogram status: Completed 03/10/22. Repeat every year  Bone Density status: Completed 10/02/17. Results reflect: Bone density results: OSTEOPENIA. Repeat every 2 years.  Additional Screening:  Hepatitis C Screening:  Completed 10/27/14  Vision Screening: Recommended annual ophthalmology exams for early detection of glaucoma and other disorders of the eye. Is the patient up to date with their  annual eye exam?  Yes  Who is the provider or what is the name of the office in which the patient attends annual eye exams? Dr Kenton Kingfisher  If pt is not established with a provider, would they like to be referred to a provider to establish care? No .   Dental Screening: Recommended annual dental exams for proper oral hygiene  Community Resource Referral / Chronic Care Management: CRR required this visit?  No   CCM required this visit?  No      Plan:     I have personally reviewed and noted the following in the patient's chart:   Medical and social history Use of alcohol, tobacco or illicit drugs  Current medications and supplements including opioid prescriptions. Patient is not currently taking opioid prescriptions. Functional ability and status Nutritional status Physical activity Advanced directives List of other physicians Hospitalizations, surgeries, and ER visits in  previous 12 months Vitals Screenings to include cognitive, depression, and falls Referrals and appointments  In addition, I have reviewed and discussed with patient certain preventive protocols, quality metrics, and best practice recommendations. A written personalized care plan for preventive services as well as general preventive health recommendations were provided to patient.     Willette Brace, LPN   [phone]   Nurse Notes: none

## 2022-06-22 DIAGNOSIS — Z01419 Encounter for gynecological examination (general) (routine) without abnormal findings: Secondary | ICD-10-CM | POA: Diagnosis not present

## 2022-06-26 NOTE — Progress Notes (Signed)
Patient Medicare AWV questionnaire was completed by the patient on 06/19/22; I have confirmed that all information answered by patient is correct and no changes since this date.          Subjective:   Brianna Simon is a 72 y.o. female who presents for Medicare Annual (Subsequent) preventive examination.  Review of Systems     Cardiac Risk Factors include: advanced age (>56mn, >>56women);dyslipidemia;hypertension     Objective:    Today's Vitals   06/20/22 1600  BP: 110/68  Pulse: 80  Temp: (!) 97.5 F (36.4 C)  SpO2: 92%  Weight: 151 lb 12.8 oz (68.9 kg)   Body mass index is 27.76 kg/m.     06/20/2022    4:11 PM 06/17/2021    2:39 PM 06/11/2020    2:43 PM 04/28/2019    3:28 PM 02/27/2015    8:50 AM 02/26/2015    8:00 PM 10/23/2013   11:59 AM  Advanced Directives  Does Patient Have a Medical Advance Directive? Yes Yes Yes Yes No No Patient does not have advance directive;Patient would not like information  Type of AScientist, forensicPower of [place]Living will Healthcare Power of [place] will Living will;Healthcare Power of Attorney     Does patient want to make changes to medical advance directive?    No - Patient declined     Copy of HFingalin Chart? No - copy requested No - copy requested No - copy requested No - copy requested     Pre-existing out of facility DNR order (yellow form or pink MOST form)       No    Current Medications (verified) Outpatient Encounter Medications as of 06/20/2022  Medication Sig   azelastine (ASTELIN) 0.1 % nasal spray daily.   CALCIUM PO Take by mouth.   cholecalciferol (VITAMIN D) 1000 units tablet Take 2,000 Units by mouth daily.    clopidogrel (PLAVIX) 75 MG tablet TAKE 1 TABLET BY MOUTH EVERY DAY   desvenlafaxine (PRISTIQ) 100 MG 24 hr tablet TAKE 1 TABLET BY MOUTH EVERY DAY   famotidine (PEPCID) 10 MG tablet Take 10 mg by mouth as needed for  heartburn or indigestion.   hydrochlorothiazide (HYDRODIURIL) 25 MG tablet TAKE 1/2 TABLET BY MOUTH EVERY DAY   ipratropium (ATROVENT) 0.06 % nasal spray INSTILL 2 SPRAYS IN EACH NOSTRIL 3 TIMES DAILY AS NEEDED   Melatonin 1 MG TABS Take 5 mg by mouth daily.   Multiple Vitamin (MULTIVITAMIN) tablet Take 1 tablet by mouth daily.   pantoprazole (PROTONIX) 20 MG tablet TAKE 1 TABLET BY MOUTH EVERY DAY   potassium chloride (KLOR-CON M) 10 MEQ tablet Take 1 tablet (10 mEq total) by mouth 2 (two) times a week.   rosuvastatin (CRESTOR) 40 MG tablet TAKE 1 TABLET BY MOUTH EVERY DAY   [DISCONTINUED] levocetirizine (XYZAL) 5 MG tablet Take 5 mg by mouth every morning.   No facility-administered encounter medications on file as of 06/20/2022.    Allergies (verified) Citalopram hydrobromide, Codeine phosphate, Erythromycin, Nitrofurantoin, Nsaids, Sulfamethoxazole, and Sulfamethoxazole-trimethoprim   History: Past Medical History:  Diagnosis Date   Anxiety    Arthritis    hips, hands, lower back - otc med prn   Cancer ([place]    skin cancer left wrist and left chest   GERD (gastroesophageal reflux disease)    Hx: UTI (urinary tract infection)    Hyperlipidemia    Hypertension    Mitral valve  prolapse    does not cause patient any problems   Seasonal allergies    Stroke [facility]) 2011   mild   SVD (spontaneous vaginal delivery)    x 1 - gave up for adoption   Past Surgical History:  Procedure Laterality Date   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    COLONOSCOPY     DILATATION & CURETTAGE/HYSTEROSCOPY WITH TRUECLEAR N/A 10/24/2013   Procedure: DILATATION & CURETTAGE/HYSTEROSCOPY WITH TRUCLEAR;  Surgeon: Logan Bores, MD;  Location: [facility];  Service: Gynecology;  Laterality: N/A;  1 1/2hrs OR time   EP IMPLANTABLE DEVICE N/A 03/02/2015   Procedure: Loop Recorder Insertion;  Surgeon: Will Meredith Leeds, MD;  Location: [facility];  Service: Cardiovascular;   Laterality: N/A;   lipoma removed     right shoulder   SINUS SURGERY WITH INSTATRAK     WISDOM TOOTH EXTRACTION     Family History  Problem Relation Age of Onset   Arrhythmia Mother 40       pacemaker    Stroke Mother    Stroke Paternal Grandmother    Diverticulitis Other    Breast cancer Maternal Aunt    Breast cancer Cousin    Social History   Socioeconomic History   Marital status: Married    Spouse name: Not on file   Number of children: Not on file   Years of education: Not on file   Highest education level: Not on file  Occupational History   Occupation: [place] [employer]    Employer: [place] [employer]  Tobacco Use   Smoking status: Former    Packs/day: 2.00    Years: 30.00    Total pack years: 60.00    Types: Cigarettes    Quit date: 03/17/2003    Years since quitting: 19.2   Smokeless tobacco: Never  Substance and Sexual Activity   Alcohol use: Yes    Alcohol/week: 3.0 standard drinks of alcohol    Types: 1 Glasses of wine, 1 Cans of beer, 1 Shots of liquor per week    Comment: occasionally   Drug use: No   Sexual activity: Yes    Birth control/protection: Post-menopausal  Other Topics Concern   Not on file  Social History Narrative   Married. No children. Husband patient of Dr. 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       Hobbies: dancing, time out with husband, enjoys concerts   Social Determinants of Health   Financial Resource Strain: Low Risk  (06/19/2022)   Overall Financial Resource Strain (CARDIA)    Difficulty of Paying Living Expenses: Not very hard  Food Insecurity: No Food Insecurity (06/19/2022)   Hunger Vital Sign    Worried About Running Out of Food in the Last Year: Never true    Ran Out of Food in the Last Year: Never true  Transportation Needs: No Transportation Needs (06/17/2021)   PRAPARE - Hydrologist (Medical): No    Lack of Transportation (Non-Medical): No  Physical Activity:  Unknown (06/19/2022)   Exercise Vital Sign    Days of Exercise per Week: 3 days    Minutes of Exercise per Session: Not on file  Stress: Stress Concern Present (06/19/2022)   [place]    Feeling of Stress : To some extent  Social Connections: Moderately Isolated (06/19/2022)   Social Connection  and Isolation Panel [NHANES]    Frequency of Communication with Friends and Family: More than three times a week    Frequency of Social Gatherings with Friends and Family: More than three times a week    Attends Religious Services: Never    Marine scientist or Organizations: No    Attends Music therapist: Never    Marital Status: Married    Tobacco Counseling Counseling given: Not Answered   Clinical Intake:  Pre-visit preparation completed: Yes  Pain : No/denies pain     BMI - recorded: 27.76 Nutritional Status: BMI 25 -29 Overweight Nutritional Risks: None Diabetes: No  How often do you need to have someone help you when you read instructions, pamphlets, or other written materials from your doctor or pharmacy?: 1 - Never  Diabetic?no  Interpreter Needed?: No  Information entered by :: Charlott Rakes, LPN   Activities of Daily Living    06/19/2022    4:43 PM  In your present state of health, do you have any difficulty performing the following activities:  Hearing? 1  Comment wears hearing aids  Vision? 1  Comment seeing an eye dr related to glaucoma  Difficulty concentrating or making decisions? 0  Walking or climbing stairs? 0  Dressing or bathing? 0  Doing errands, shopping? 0  Preparing Food and eating ? N  Using the Toilet? N  In the past six months, have you accidently leaked urine? N  Do you have problems with loss of bowel control? N  Managing your Medications? N  Managing your Finances? N  Housekeeping or managing your Housekeeping? N  Comment pt stated no changes from early  checkin    Patient Care Team: Marin Olp, MD as PCP - General (Family Medicine) Paula Compton, MD as Consulting Physician (Obstetrics and Gynecology) Evelina Bucy, DPM (Inactive) as Consulting Physician (Podiatry) Garvin Fila, MD as Consulting Physician (Neurology) [facility], [facility] (Ophthalmology) Edythe Clarity, [facility] (Pharmacist)  Indicate any recent Medical Services you may have received from other than [facility] providers in the past year (date may be approximate).     Assessment:   This is a routine wellness examination for [place].  Hearing/Vision screen Hearing Screening - Comments:: Pt wears hearing aids  Vision Screening - Comments:: Pt follows up with Dr Ishmael Holter for eye exams   Dietary issues and exercise activities discussed: Current Exercise Habits: Home exercise routine, Type of exercise: walking, Time (Minutes): 20, Frequency (Times/Week): 5, Weekly Exercise (Minutes/Week): 100   Goals Addressed             This Visit's Progress    Patient Stated       Lose weight       Depression Screen    06/20/2022    4:10 PM 01/17/2022    3:25 PM 07/14/2021    4:00 PM 06/17/2021    2:38 PM 01/04/2021    3:02 PM 11/23/2020    3:13 PM 06/11/2020    2:41 PM  PHQ 2/9 Scores  PHQ - 2 Score 0 0 0 0 2 4 1  $ PHQ- 9 Score  3 0  5 11     Fall Risk    06/19/2022    4:43 PM 06/17/2021    2:40 PM 11/23/2020    3:12 PM 06/11/2020    2:44 PM 12/25/2019    2:34 PM  Fall Risk   Falls in the past year? 1 0 0 1 0  Number falls  in past yr: 1 0 0 1 0  Injury with Fall? 0 0 0 0 0  Risk for fall due to : Impaired vision Impaired vision;Impaired balance/gait No Fall Risks Impaired vision;Impaired balance/gait;Impaired mobility   Follow up Falls prevention discussed Falls prevention discussed Falls evaluation completed Falls prevention discussed     FALL RISK PREVENTION PERTAINING TO THE HOME:  Any stairs in or around the home? No  If so, are there any without  handrails? No  Home free of loose throw rugs in walkways, pet beds, electrical cords, etc? Yes  Adequate lighting in your home to reduce risk of falls? Yes   ASSISTIVE DEVICES UTILIZED TO PREVENT FALLS:  Life alert? Yes  Use of a cane, walker or w/c? No  Grab bars in the bathroom? Yes  Shower chair or bench in shower? Yes  Elevated toilet seat or a handicapped toilet? No   TIMED UP AND GO:  Was the test performed? Yes .  Length of time to ambulate 10 feet: 10 sec.   Gait steady and fast without use of assistive device  Cognitive Function:        06/20/2022    4:15 PM 06/17/2021    2:43 PM 06/11/2020    2:47 PM 04/28/2019    3:29 PM  6CIT Screen  What Year? 0 points 0 points 0 points 0 points  What month? 0 points 0 points 0 points 0 points  What time? 0 points 0 points  0 points  Count back from 20 0 points 0 points 0 points 0 points  Months in reverse 0 points 0 points 0 points 0 points  Repeat phrase 0 points 0 points 0 points 0 points  Total Score 0 points 0 points  0 points    Immunizations Immunization History  Administered Date(s) Administered   Fluad Quad(high Dose 65+) 03/04/2019, 03/18/2020, 02/24/2021, 01/30/2022   Influenza Split 03/17/2011, 02/09/2012   Influenza Whole 02/12/2008, 02/22/2010   Influenza, High Dose Seasonal PF 02/27/2018   Influenza,inj,Quad PF,6+ Mos 02/10/2013, 03/09/2014, 02/05/2015, 02/25/2016, 02/23/2017   Moderna Sars-Covid-2 Vaccination 04/09/2021   PFIZER Comirnaty(Gray Top)Covid-19 Tri-Sucrose Vaccine 09/10/2020   PFIZER(Purple Top)SARS-COV-2 Vaccination 06/20/2019, 07/11/2019, 03/01/2020, 09/10/2020   PNEUMOCOCCAL CONJUGATE-20 11/23/2020   Pneumococcal Conjugate-13 11/14/2019   Pneumococcal Polysaccharide-23 11/05/2018   Tdap 10/27/2014   Zoster Recombinat (Shingrix) 09/26/2016, 12/26/2016   Zoster, Live 04/03/2013    TDAP status: Up to date  Flu Vaccine status: Up to date  Pneumococcal vaccine status: Up to  date  Covid-19 vaccine status: Completed vaccines  Qualifies for Shingles Vaccine? Yes   Zostavax completed Yes   Shingrix Completed?: Yes  Screening Tests Health Maintenance  Topic Date Due   COVID-19 Vaccine (7 - 2023-24 season) 01/13/2022   Medicare Annual Wellness (AWV)  06/21/2023   MAMMOGRAM  03/10/2024   Fecal DNA (Cologuard)  08/02/2024   DTaP/Tdap/Td (2 - Td or Tdap) 10/26/2024   Pneumonia Vaccine 5+ Years old  Completed   INFLUENZA VACCINE  Completed   DEXA SCAN  Completed   Hepatitis C Screening  Completed   Zoster Vaccines- Shingrix  Completed   HPV VACCINES  Aged Out    Health Maintenance  Health Maintenance Due  Topic Date Due   COVID-19 Vaccine (7 - 2023-24 season) 01/13/2022    Colorectal cancer screening: Type of screening: Cologuard. Completed 08/02/21. Repeat every 3 years  Mammogram status: Completed 03/10/22. Repeat every year  Bone Density status: Completed 10/02/17. Results reflect: Bone density results: OSTEOPENIA. Repeat  every 2 years.  Additional Screening:  Hepatitis C Screening:  Completed 10/27/14  Vision Screening: Recommended annual ophthalmology exams for early detection of glaucoma and other disorders of the eye. Is the patient up to date with their annual eye exam?  Yes  Who is the provider or what is the name of the office in which the patient attends annual eye exams? Dr Kenton Kingfisher  If pt is not established with a provider, would they like to be referred to a provider to establish care? No .   Dental Screening: Recommended annual dental exams for proper oral hygiene  Community Resource Referral / Chronic Care Management: CRR required this visit?  No   CCM required this visit?  No      Plan:     I have personally reviewed and noted the following in the patient's chart:   Medical and social history Use of alcohol, tobacco or illicit drugs  Current medications and supplements including opioid prescriptions. Patient is not  currently taking opioid prescriptions. Functional ability and status Nutritional status Physical activity Advanced directives List of other physicians Hospitalizations, surgeries, and ER visits in previous 12 months Vitals Screenings to include cognitive, depression, and falls Referrals and appointments  In addition, I have reviewed and discussed with patient certain preventive protocols, quality metrics, and best practice recommendations. A written personalized care plan for preventive services as well as general preventive health recommendations were provided to patient.     Willette Brace, LPN   [id number]   Nurse Notes: none

## 2022-06-30 DIAGNOSIS — H25812 Combined forms of age-related cataract, left eye: Secondary | ICD-10-CM | POA: Diagnosis not present

## 2022-06-30 DIAGNOSIS — H40033 Anatomical narrow angle, bilateral: Secondary | ICD-10-CM | POA: Diagnosis not present

## 2022-06-30 DIAGNOSIS — H25811 Combined forms of age-related cataract, right eye: Secondary | ICD-10-CM | POA: Diagnosis not present

## 2022-07-10 DIAGNOSIS — Z23 Encounter for immunization: Secondary | ICD-10-CM | POA: Diagnosis not present

## 2022-07-11 ENCOUNTER — Telehealth: Payer: Self-pay | Admitting: Family Medicine

## 2022-07-11 NOTE — Telephone Encounter (Signed)
Vaccine updated

## 2022-07-11 NOTE — Telephone Encounter (Signed)
Patient states she wanted PCP to know that she received her 6th COVID vaccine on 07/10/22 @ [facility]. Patient requests to have this added to her chart.

## 2022-07-18 ENCOUNTER — Encounter: Payer: Self-pay | Admitting: Family Medicine

## 2022-07-18 ENCOUNTER — Ambulatory Visit (INDEPENDENT_AMBULATORY_CARE_PROVIDER_SITE_OTHER): Payer: Medicare Other | Admitting: Family Medicine

## 2022-07-18 VITALS — BP 110/70 | HR 79 | Temp 97.7°F | Ht 62.0 in | Wt 151.0 lb

## 2022-07-18 DIAGNOSIS — D692 Other nonthrombocytopenic purpura: Secondary | ICD-10-CM | POA: Diagnosis not present

## 2022-07-18 DIAGNOSIS — Z780 Asymptomatic menopausal state: Secondary | ICD-10-CM

## 2022-07-18 DIAGNOSIS — F325 Major depressive disorder, single episode, in full remission: Secondary | ICD-10-CM

## 2022-07-18 DIAGNOSIS — I10 Essential (primary) hypertension: Secondary | ICD-10-CM | POA: Diagnosis not present

## 2022-07-18 DIAGNOSIS — E785 Hyperlipidemia, unspecified: Secondary | ICD-10-CM

## 2022-07-18 DIAGNOSIS — K760 Fatty (change of) liver, not elsewhere classified: Secondary | ICD-10-CM

## 2022-07-18 DIAGNOSIS — I69359 Hemiplegia and hemiparesis following cerebral infarction affecting unspecified side: Secondary | ICD-10-CM | POA: Diagnosis not present

## 2022-07-18 NOTE — Progress Notes (Signed)
Phone [phone] In person visit   Subjective:   Brianna Simon is a 72 y.o. year old very pleasant female patient who presents for/with See problem oriented charting Chief Complaint  Patient presents with   Follow-up    (No mask)   Gastroesophageal Reflux   Hyperlipidemia   Hypertension    Past Medical History-  Patient Active Problem List   Diagnosis Date Noted   History of CVA (cerebrovascular accident) 03/15/2010    Priority: High   Fatty liver 05/24/2020    Priority: Medium    Depression, major, single episode, complete remission ([place]) 11/14/2019    Priority: Medium    Hemiplegia affecting dominant side, 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[place]) 03/01/2018    Priority: Medium    Osteopenia 10/02/2017    Priority: Medium    Former smoker 12/09/2014    Priority: Medium    Anxiety state 10/27/2014    Priority: Medium    Hyperlipidemia 03/15/2010    Priority: Medium    Essential hypertension 12/07/2006    Priority: Medium    Senile purpura ([place]) 11/05/2018    Priority: Low   Allergic rhinitis 10/27/2014    Priority: Low   Esophageal reflux 12/31/2006    Priority: Low   Insomnia 12/31/2006    Priority: Low   MVP (mitral valve prolapse) 12/10/2006    Priority: Low   Alkaline phosphatase elevation 06/16/2019   Benign paroxysmal positional vertigo 11/13/2016   Low back pain 09/26/2016   Dysarthria 03/02/2015    Medications- reviewed and updated Current Outpatient Medications  Medication Sig Dispense Refill   azelastine (ASTELIN) 0.1 % nasal spray daily.  5   CALCIUM PO Take by mouth.     cholecalciferol (VITAMIN D) 1000 units tablet Take 2,000 Units by mouth daily.      clopidogrel (PLAVIX) 75 MG tablet TAKE 1 TABLET BY MOUTH EVERY DAY 90 tablet 3   desvenlafaxine (PRISTIQ) 100 MG 24 hr tablet TAKE 1 TABLET BY MOUTH EVERY DAY 90 tablet 4   famotidine (PEPCID) 10 MG tablet Take 10 mg by mouth as needed for heartburn or indigestion.     hydrochlorothiazide (HYDRODIURIL) 25  MG tablet TAKE 1/2 TABLET BY MOUTH EVERY DAY 45 tablet 3   ipratropium (ATROVENT) 0.06 % nasal spray INSTILL 2 SPRAYS IN EACH NOSTRIL 3 TIMES DAILY AS NEEDED  5   Melatonin 1 MG TABS Take 5 mg by mouth daily.     Multiple Vitamin (MULTIVITAMIN) tablet Take 1 tablet by mouth daily.     pantoprazole (PROTONIX) 20 MG tablet TAKE 1 TABLET BY MOUTH EVERY DAY 90 tablet 3   potassium chloride (KLOR-CON M) 10 MEQ tablet Take 1 tablet (10 mEq total) by mouth 2 (two) times a week. 26 tablet 3   rosuvastatin (CRESTOR) 40 MG tablet TAKE 1 TABLET BY MOUTH EVERY DAY 90 tablet 3   No current facility-administered medications for this visit.     Objective:  BP 110/70   Pulse 79   Temp 97.7 F (36.5 C)   Ht '5\' 2"'$  (1.575 m)   Wt 151 lb (68.5 kg)   LMP  (LMP Unknown)   SpO2 95%   BMI 27.62 kg/m  Gen: NAD, resting comfortably CV: RRR no murmurs rubs or gallops Lungs: CTAB no crackles, wheeze, rhonchi Ext: no edema Skin: warm, dry    Assessment and Plan   # Social update-brother died in 03-16-23 from lung cancer-patient was related  #History of CVA with residual right hemiplegia (numbness mainly right hand with  some trouble picking things up as a result- no weakness reported) with loop recorder in place showing no evidence of atrial fibrillation #hyperlipidemia S: Medication: rosuvastatin 40 mg, Plavix 75 mg. No aspirin Lab Results  Component Value Date   CHOL 117 07/15/2021   HDL 46.90 07/15/2021   LDLCALC 49 07/15/2021   LDLDIRECT 75 11/14/2019   TRIG 103.0 07/15/2021   CHOLHDL 2 07/15/2021    A/P: strokes history noted- remain on plavix and statin to reduce lipids -  hemiplegia noted- continue statin and plavix to prevent recurrence For hld- LDL goal under 70- update lipids today- continue current medications    # Depression/Anxiety S: Medication: pristiq 50 mg--> '100mg'$  . Has some home stress but managing reasonably well -prior Lexapro 15 mg     07/18/2022    3:14 PM 06/20/2022    4:10  PM 01/17/2022    3:25 PM  Depression screen PHQ 2/9  Decreased Interest 0 0 0  Down, Depressed, Hopeless 0 0 0  PHQ - 2 Score 0 0 0  Altered sleeping 0  3  Tired, decreased energy 0  0  Change in appetite 0  0  Feeling bad or failure about yourself  0  0  Trouble concentrating 0  0  Moving slowly or fidgety/restless 0  0  Suicidal thoughts 0  0  PHQ-9 Score 0  3  Difficult doing work/chores Not difficult at all  Not difficult at all  A/P: Full remission-continue current medication  #hypertension S: medication: hctz 25 mg - takes half tablet daily with potassium 2 days a week Home readings #s: rarely checks  BP Readings from Last 3 Encounters:  07/18/22 110/70  06/20/22 110/68  01/17/22 102/70   A/P: Controlled. Continue current medications.  # GERD S:Medication: pantoprazole 20 mg daily as significant flare-ups if she misses doses in the past.  -also Pepcid 20 mg twice daily over-the-counter trial failed in the past  A/P: Reflux is  generally well-controlled-continue current medication  #Elevated alkaline phosphatase/fatty liver- work-up reassuring with Dr. Loanne Drilling in endocrinology- later #s improved. Has had extensive blood work. Plan was for 1-year repeat visit with endocrine  -Patient declines follow-up unless has worsening alkaline phosphatase -For fatty liver- Encouraged need for healthy eating, regular exercise, weight loss.  Weight up just a few pounds from last visit    Latest Ref Rng & Units 01/17/2022    4:29 PM 07/15/2021    2:20 PM 11/23/2020    3:47 PM  Hepatic Function  Total Protein 6.0 - 8.3 g/dL 6.9  6.9  6.8   Albumin 3.5 - 5.2 g/dL 3.8  4.1  4.1   AST 0 - 37 U/L '22  22  20   '$ ALT 0 - 35 U/L '22  26  22   '$ Alk Phosphatase 39 - 117 U/L 99  103  95   Total Bilirubin 0.2 - 1.2 mg/dL 0.3  0.5  0.4     # Osteopenia-last DEXA through GYN in 2019-encouraged repeat- she agrees at breast center- referral placed today - she is ok doing this through me  #senile purpura-  noted. Stable. Check cbc at least annually  Recommended follow up: Return in about 6 months (around 01/18/2023) for followup or sooner if needed.Schedule b4 you leave. Future Appointments  Date Time Provider [place]  07/03/2023  4:00 PM [facility]    Lab/Order associations:   ICD-10-CM   1. Depression, major, single episode, complete remission ([place])  F32.5     2. Essential hypertension  I10     3. Fatty liver  K76.0     4. Hemiplegia affecting dominant side, 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[facility])  I69.359     5. Hyperlipidemia, unspecified hyperlipidemia type  E78.5     6. Senile purpura ([facility]) Chronic D69.2     7. Postmenopausal  Z78.0       No orders of the defined types were placed in this encounter.   Return precautions advised.  Garret Reddish, MD

## 2022-07-18 NOTE — Patient Instructions (Addendum)
Please stop by lab before you go If you have mychart- we will send your results within 3 business days of [place] receiving them.  If you do not have mychart- we will call you about results within 5 business days of [place] receiving them.  *please also note that you will see labs on mychart as soon as they post. I will later go in and write notes on them- will say "notes from Dr. Yong Channel"   [facility]- [facility] [place] Schedule an appointment by calling [phone].  Recommended follow up: Return in about 6 months (around 01/18/2023) for followup or sooner if needed.Schedule b4 you leave.

## 2022-07-19 LAB — COMPREHENSIVE METABOLIC PANEL
ALT: 23 U/L (ref 0–35)
AST: 21 U/L (ref 0–37)
Albumin: 3.8 g/dL (ref 3.5–5.2)
Alkaline Phosphatase: 108 U/L (ref 39–117)
BUN: 14 mg/dL (ref 6–23)
CO2: 30 mEq/L (ref 19–32)
Calcium: 9.5 mg/dL (ref 8.4–10.5)
Chloride: 101 mEq/L (ref 96–112)
Creatinine, Ser: 0.66 mg/dL (ref 0.40–1.20)
GFR: 89.35 mL/min (ref >60.00)
Glucose, Bld: 98 mg/dL (ref 70–99)
Potassium: 3.3 mEq/L — ABNORMAL LOW (ref 3.5–5.1)
Sodium: 141 mEq/L (ref 135–145)
Total Bilirubin: 0.3 mg/dL (ref 0.2–1.2)
Total Protein: 6.5 g/dL (ref 6.0–8.3)

## 2022-07-19 LAB — CBC WITH DIFFERENTIAL/PLATELET
Basophils Absolute: 0 10*3/uL (ref 0.0–0.1)
Basophils Relative: 0.6 % (ref 0.0–3.0)
Eosinophils Absolute: 0.1 10*3/uL (ref 0.0–0.7)
Eosinophils Relative: 2.2 % (ref 0.0–5.0)
HCT: 44 % (ref 36.0–46.0)
Hemoglobin: 14.8 g/dL (ref 12.0–15.0)
Lymphocytes Relative: 24.9 % (ref 12.0–46.0)
Lymphs Abs: 1.3 10*3/uL (ref 0.7–4.0)
MCHC: 33.7 g/dL (ref 30.0–36.0)
MCV: 89.7 fl (ref 78.0–100.0)
Monocytes Absolute: 0.5 10*3/uL (ref 0.1–1.0)
Monocytes Relative: 8.7 % (ref 3.0–12.0)
Neutro Abs: 3.4 10*3/uL (ref 1.4–7.7)
Neutrophils Relative %: 63.6 % (ref 43.0–77.0)
Platelets: 303 10*3/uL (ref 150.0–400.0)
RBC: 4.91 Mil/uL (ref 3.87–5.11)
RDW: 13.2 % (ref 11.5–15.5)
WBC: 5.4 10*3/uL (ref 4.0–10.5)

## 2022-07-19 LAB — LIPID PANEL
Cholesterol: 119 mg/dL (ref 0–200)
HDL: 43.1 mg/dL (ref >39.00)
LDL Cholesterol: 42 mg/dL (ref 0–99)
NonHDL: 76.06
Total CHOL/HDL Ratio: 3
Triglycerides: 172 mg/dL — ABNORMAL HIGH (ref 0.0–149.0)
VLDL: 34.4 mg/dL (ref 0.0–40.0)

## 2022-07-20 ENCOUNTER — Other Ambulatory Visit: Payer: Self-pay

## 2022-07-20 MED ORDER — POTASSIUM CHLORIDE CRYS ER 10 MEQ PO TBCR: 10.0000 meq | EXTENDED_RELEASE_TABLET | ORAL | 3 refills | Status: DC

## 2022-10-12 ENCOUNTER — Other Ambulatory Visit: Payer: Self-pay | Admitting: Family Medicine

## 2022-10-21 ENCOUNTER — Other Ambulatory Visit: Payer: Self-pay | Admitting: Family Medicine

## 2022-11-01 DIAGNOSIS — H25811 Combined forms of age-related cataract, right eye: Secondary | ICD-10-CM | POA: Diagnosis not present

## 2022-11-01 DIAGNOSIS — H52221 Regular astigmatism, right eye: Secondary | ICD-10-CM | POA: Diagnosis not present

## 2022-11-01 DIAGNOSIS — H52222 Regular astigmatism, left eye: Secondary | ICD-10-CM | POA: Diagnosis not present

## 2022-11-01 DIAGNOSIS — H25812 Combined forms of age-related cataract, left eye: Secondary | ICD-10-CM | POA: Diagnosis not present

## 2022-11-16 ENCOUNTER — Other Ambulatory Visit: Payer: Self-pay | Admitting: Family Medicine

## 2022-11-16 DIAGNOSIS — I639 Cerebral infarction, unspecified: Secondary | ICD-10-CM

## 2022-12-11 DIAGNOSIS — H25811 Combined forms of age-related cataract, right eye: Secondary | ICD-10-CM | POA: Diagnosis not present

## 2022-12-25 DIAGNOSIS — H25812 Combined forms of age-related cataract, left eye: Secondary | ICD-10-CM | POA: Diagnosis not present

## 2022-12-28 ENCOUNTER — Encounter (INDEPENDENT_AMBULATORY_CARE_PROVIDER_SITE_OTHER): Payer: Self-pay

## 2023-01-11 ENCOUNTER — Ambulatory Visit
Admission: RE | Admit: 2023-01-11 | Discharge: 2023-01-11 | Disposition: A | Discharge status: Home / Self Care | Payer: Medicare Other | Source: Ambulatory Visit | Attending: Family Medicine | Admitting: Family Medicine

## 2023-01-11 DIAGNOSIS — M8588 Other specified disorders of bone density and structure, other site: Secondary | ICD-10-CM | POA: Diagnosis not present

## 2023-01-11 DIAGNOSIS — N958 Other specified menopausal and perimenopausal disorders: Secondary | ICD-10-CM | POA: Diagnosis not present

## 2023-01-11 DIAGNOSIS — E349 Endocrine disorder, unspecified: Secondary | ICD-10-CM | POA: Diagnosis not present

## 2023-01-11 DIAGNOSIS — Z780 Asymptomatic menopausal state: Secondary | ICD-10-CM

## 2023-01-18 ENCOUNTER — Ambulatory Visit (INDEPENDENT_AMBULATORY_CARE_PROVIDER_SITE_OTHER): Payer: Medicare Other | Admitting: Family Medicine

## 2023-01-18 ENCOUNTER — Encounter: Payer: Self-pay | Admitting: Family Medicine

## 2023-01-18 VITALS — BP 120/74 | HR 94 | Temp 97.9°F | Ht 62.0 in | Wt 158.6 lb

## 2023-01-18 DIAGNOSIS — I10 Essential (primary) hypertension: Secondary | ICD-10-CM | POA: Diagnosis not present

## 2023-01-18 DIAGNOSIS — F325 Major depressive disorder, single episode, in full remission: Secondary | ICD-10-CM

## 2023-01-18 DIAGNOSIS — M810 Age-related osteoporosis without current pathological fracture: Secondary | ICD-10-CM

## 2023-01-18 DIAGNOSIS — E785 Hyperlipidemia, unspecified: Secondary | ICD-10-CM | POA: Diagnosis not present

## 2023-01-18 MED ORDER — BUPROPION HCL ER (XL) 150 MG PO TB24
150.0000 mg | ORAL_TABLET | Freq: Every day | ORAL | 5 refills | Status: AC
Start: 2023-01-18 — End: ?

## 2023-01-18 NOTE — Patient Instructions (Addendum)
Let us know when you get your flu or COVID vaccine at the pharmacy.  Please give her calcium in diet handout -goal total is 1200mg  per day so 600 through pill and 600 through diet is reasonable  we discussed possible fosamax- she wants to try calcium/vitamin D/weight bearing exercise 150 minutes a week and recheck in 2 years   depression reasonably well controlled at 5 or less for phq9 but slightly worse than last visit and rates as somewhat difficult. Energy/motivatoin low and we opted to try Wellbutrin in the morning to see if this helps her- follow up in 2-3 months -any thoughts of self harm she will let us know as soon as possible or call 911  Please stop by lab before you go If you have mychart- we will send your results within 3 business days of [place] receiving them.  If you do not have mychart- we will call you about results within 5 business days of [place] receiving them.  *please also note that you will see labs on mychart as soon as they post. I will later go in and write notes on them- will say "notes from Dr. Durene Cal"   Recommended follow up: 2-3 month follow up

## 2023-01-18 NOTE — Progress Notes (Signed)
Phone [phone] In person visit   Subjective:   Brianna Simon is a 72 y.o. year old very pleasant female patient who presents for/with See problem oriented charting Chief Complaint  Patient presents with   Medical Management of Chronic Issues   Depression   Hyperlipidemia   Hypertension   Past Medical History-  Patient Active Problem List   Diagnosis Date Noted   History of CVA (cerebrovascular accident) 02/22/2010    Priority: High   Fatty liver 05/24/2020    Priority: Medium    Depression, major, single episode, complete remission (HCC) 11/14/2019    Priority: Medium    Hemiplegia affecting dominant side, 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HCC) 03/01/2018    Priority: Medium    Osteopenia 10/02/2017    Priority: Medium    Former smoker 12/09/2014    Priority: Medium    Anxiety state 10/27/2014    Priority: Medium    Hyperlipidemia 02/22/2010    Priority: Medium    Essential hypertension 12/07/2006    Priority: Medium    Allergic rhinitis 10/27/2014    Priority: Low   Esophageal reflux 12/31/2006    Priority: Low   Insomnia 12/31/2006    Priority: Low   MVP (mitral valve prolapse) 12/10/2006    Priority: Low   Alkaline phosphatase elevation 06/16/2019   Benign paroxysmal positional vertigo 11/13/2016   Low back pain 09/26/2016   Dysarthria 03/02/2015    Medications- reviewed and updated Current Outpatient Medications  Medication Sig Dispense Refill   azelastine (ASTELIN) 0.1 % nasal spray daily.  5   CALCIUM PO Take by mouth.     cholecalciferol (VITAMIN D) 1000 units tablet Take 2,000 Units by mouth daily.      clopidogrel (PLAVIX) 75 MG tablet TAKE 1 TABLET BY MOUTH EVERY DAY 90 tablet 3   desvenlafaxine (PRISTIQ) 100 MG 24 hr tablet TAKE 1 TABLET BY MOUTH EVERY DAY 90 tablet 4   famotidine (PEPCID) 10 MG tablet Take 10 mg by mouth as needed for heartburn or indigestion.     hydrochlorothiazide (HYDRODIURIL) 25 MG tablet TAKE 1/2 TABLET BY MOUTH DAILY 45 tablet 3    ipratropium (ATROVENT) 0.06 % nasal spray INSTILL 2 SPRAYS IN EACH NOSTRIL 3 TIMES DAILY AS NEEDED  5   KLOR-CON M10 10 MEQ tablet TAKE 1 TABLET (10 MEQ TOTAL) BY MOUTH 4 (FOUR) TIMES A WEEK. 156 tablet 1   Melatonin 1 MG TABS Take 5 mg by mouth daily.     Multiple Vitamin (MULTIVITAMIN) tablet Take 1 tablet by mouth daily.     pantoprazole (PROTONIX) 20 MG tablet TAKE 1 TABLET BY MOUTH EVERY DAY 90 tablet 3   rosuvastatin (CRESTOR) 40 MG tablet TAKE 1 TABLET BY MOUTH EVERY DAY 90 tablet 3   No current facility-administered medications for this visit.     Objective:  BP 120/74   Pulse 94   Temp 97.9 F (36.6 C)   Ht 5\' 2"  (1.575 m)   Wt 158 lb 9.6 oz (71.9 kg)   LMP  (LMP Unknown)   SpO2 95%   BMI 29.01 kg/m  Gen: NAD, resting comfortably CV: RRR no murmurs rubs or gallops Lungs: CTAB no crackles, wheeze, rhonchi Ext: no edema Skin: warm, dry     Assessment and Plan   # Osteoporosis S: Last DEXA: 01/11/2023 with worst T-score -2.6 and right femoral neck  Medication (bisphosphonate or prolia): none  Calcium: 1200mg  (through diet ok) recommended - takes 600 but willing to get other through diet Vitamin  D: 1000 units a day recommended- takes 2000 units -no weight bearing exercise but is starting tonight with [facility] A/P: we discussed possible fosamax- she wants to try calcium/vitamin D/weight bearing exercise 150 minutes a week and recheck in 2 years   #History of CVA with residual right hemiplegia (numbness mainly right hand with some trouble picking things up as a result- no weakness reported) with loop recorder in place showing no evidence of atrial fibrillation #hyperlipidemia S: Medication: rosuvastatin 40 mg, Plavix 75 mg. No aspirin Lab Results  Component Value Date   CHOL 119 07/18/2022   HDL 43.10 07/18/2022   LDLCALC 42 07/18/2022   LDLDIRECT 75 11/14/2019   TRIG 172.0 (H) 07/18/2022   CHOLHDL 3 07/18/2022    A/P: strokes history noted -  right  hemiplegia- continue statin and Plavix for risk factor modification  For hld- LDL goal under 70- well controlled continue current medications   -also check TSH for fatigue  # Depression/Anxiety S: Medication: pristiq 50 mg--> 100 mg. Feels like has low energy and one of big issues    01/18/2023    2:31 PM 07/18/2022    3:14 PM 06/20/2022    4:10 PM  Depression screen PHQ 2/9  Decreased Interest 2 0 0  Down, Depressed, Hopeless 0 0 0  PHQ - 2 Score 2 0 0  Altered sleeping 1 0   Tired, decreased energy 2 0   Change in appetite 0 0   Feeling bad or failure about yourself  0 0   Trouble concentrating 0 0   Moving slowly or fidgety/restless 0 0   Suicidal thoughts 0 0   PHQ-9 Score 5 0   Difficult doing work/chores Somewhat difficult Not difficult at all   A/P: depression reasonably well controlled at 5 or less for phq9 but slightly worse than last visit and rates as somewhat difficult. Energy/motivatoin low and we opted to try Wellbutrin in the morning to see if this helps her- follow up in 2-3 months -any thoughts of self harm she will let us know as soon as possible or call 911  #hypertension S: medication: hctz 25 mg - takes half tablet daily with potassium 4 days a week BP Readings from Last 3 Encounters:  01/18/23 120/74  07/18/22 110/70  06/20/22 110/68  A/P: stable- continue current medicines   # GERD S:Medication: pantoprazole 20 mg daily as significant flare-ups if she misses doses in the past.  -also Pepcid 20 mg twice daily over-the-counter trial failed in the past  A/P: mostly controlled by pantoprazole but breakthrough requring Pepcid as needed. Weight up could contribute.    #Right Upper Quadrant Pain- very rare at this point- prior workup with right upper quadrant of abdomen ultrasound   #Elevated alkaline phosphatase- work-up reassuring with Dr. Everardo All in endocrinology- later #s improved. Has had extensive blood work. Plan was for 1-year repeat visit with  endocrine - not currently scheduled- patient prefers not to return unless worsening. Looked good last visit Lab Results  Component Value Date   ALT 23 07/18/2022   AST 21 07/18/2022   ALKPHOS 108 07/18/2022   BILITOT 0.3 07/18/2022   Recommended follow up: 2-3 month follow up  Future Appointments  Date Time Provider Department Center  07/03/2023  4:00 PM [facility] ANNUAL WELLNESS VISIT 1 [facility]    Lab/Order associations:   ICD-10-CM   1. Essential hypertension  I10     2. Hyperlipidemia, unspecified hyperlipidemia type  E78.5  3. Depression, major, single episode, complete remission (HCC)  F32.5       No orders of the defined types were placed in this encounter.   Return precautions advised.  Tana Conch, MD

## 2023-01-19 LAB — COMPREHENSIVE METABOLIC PANEL
ALT: 25 U/L (ref 0–35)
AST: 20 U/L (ref 0–37)
Albumin: 3.8 g/dL (ref 3.5–5.2)
Alkaline Phosphatase: 120 U/L — ABNORMAL HIGH (ref 39–117)
BUN: 14 mg/dL (ref 6–23)
CO2: 33 meq/L — ABNORMAL HIGH (ref 19–32)
Calcium: 9.1 mg/dL (ref 8.4–10.5)
Chloride: 100 meq/L (ref 96–112)
Creatinine, Ser: 0.69 mg/dL (ref 0.40–1.20)
GFR: 88.08 mL/min (ref >60.00)
Glucose, Bld: 89 mg/dL (ref 70–99)
Potassium: 3.4 meq/L — ABNORMAL LOW (ref 3.5–5.1)
Sodium: 141 meq/L (ref 135–145)
Total Bilirubin: 0.3 mg/dL (ref 0.2–1.2)
Total Protein: 6.9 g/dL (ref 6.0–8.3)

## 2023-01-19 LAB — CBC WITH DIFFERENTIAL/PLATELET
Basophils Absolute: 0.1 10*3/uL (ref 0.0–0.1)
Basophils Relative: 1.1 % (ref 0.0–3.0)
Eosinophils Absolute: 0.1 10*3/uL (ref 0.0–0.7)
Eosinophils Relative: 2.2 % (ref 0.0–5.0)
HCT: 43.8 % (ref 36.0–46.0)
Hemoglobin: 14.4 g/dL (ref 12.0–15.0)
Lymphocytes Relative: 22.5 % (ref 12.0–46.0)
Lymphs Abs: 1.3 10*3/uL (ref 0.7–4.0)
MCHC: 32.8 g/dL (ref 30.0–36.0)
MCV: 91.8 fl (ref 78.0–100.0)
Monocytes Absolute: 0.6 10*3/uL (ref 0.1–1.0)
Monocytes Relative: 10.6 % (ref 3.0–12.0)
Neutro Abs: 3.7 10*3/uL (ref 1.4–7.7)
Neutrophils Relative %: 63.6 % (ref 43.0–77.0)
Platelets: 297 10*3/uL (ref 150.0–400.0)
RBC: 4.77 Mil/uL (ref 3.87–5.11)
RDW: 14 % (ref 11.5–15.5)
WBC: 5.8 10*3/uL (ref 4.0–10.5)

## 2023-01-19 LAB — VITAMIN D 25 HYDROXY (VIT D DEFICIENCY, FRACTURES): VITD: 67.55 ng/mL (ref 30.00–100.00)

## 2023-01-19 LAB — TSH: TSH: 1.61 u[IU]/mL (ref 0.35–5.50)

## 2023-02-06 ENCOUNTER — Other Ambulatory Visit: Payer: Self-pay | Admitting: Family Medicine

## 2023-02-09 ENCOUNTER — Other Ambulatory Visit: Payer: Self-pay | Admitting: Family Medicine

## 2023-03-05 ENCOUNTER — Other Ambulatory Visit: Payer: Self-pay | Admitting: Family Medicine

## 2023-03-31 DIAGNOSIS — Z23 Encounter for immunization: Secondary | ICD-10-CM | POA: Diagnosis not present

## 2023-04-02 ENCOUNTER — Ambulatory Visit (INDEPENDENT_AMBULATORY_CARE_PROVIDER_SITE_OTHER): Payer: Medicare Other | Admitting: Family Medicine

## 2023-04-02 ENCOUNTER — Encounter: Payer: Self-pay | Admitting: Family Medicine

## 2023-04-02 VITALS — BP 120/64 | HR 77 | Temp 97.7°F | Ht 62.0 in | Wt 157.8 lb

## 2023-04-02 DIAGNOSIS — F325 Major depressive disorder, single episode, in full remission: Secondary | ICD-10-CM

## 2023-04-02 DIAGNOSIS — I10 Essential (primary) hypertension: Secondary | ICD-10-CM | POA: Diagnosis not present

## 2023-04-02 MED ORDER — BUPROPION HCL ER (XL) 300 MG PO TB24: 300.0000 mg | ORAL_TABLET | Freq: Every day | ORAL | 5 refills | Status: DC

## 2023-04-02 NOTE — Patient Instructions (Addendum)
depression did not see substantial improvements- we discussed either weaning off or trial of slightly higher dose- we agreed to go up to 300 mg and if not beneficial by 2 month follow up considering weaning back off -any thoughts of self harm call 911 or [phone]  Blood pressure looks great today  Thrilled with your exercise and eating efforts- keep up the great work! Even if just stable weight through next visit thatd be impressive through the holidays   Please stop by lab before you go If you have mychart- we will send your results within 3 business days of [place] receiving them.  If you do not have mychart- we will call you about results within 5 business days of [place] receiving them.  *please also note that you will see labs on mychart as soon as they post. I will later go in and write notes on them- will say "notes from Dr. Durene Cal"  Recommended follow up: Return in about 2 months (around 06/02/2023) for followup or sooner if needed.Schedule b4 you leave.

## 2023-04-02 NOTE — Progress Notes (Signed)
Phone [phone] In person visit   Subjective:   Brianna Simon is a 72 y.o. year old very pleasant female patient who presents for/with See problem oriented charting Chief Complaint  Patient presents with   Medical Management of Chronic Issues   Hypertension    Past Medical History-  Patient Active Problem List   Diagnosis Date Noted   History of CVA (cerebrovascular accident) 02/22/2010    Priority: High   Fatty liver 05/24/2020    Priority: Medium    Depression, major, single episode, complete remission (HCC) 11/14/2019    Priority: Medium    Hemiplegia affecting dominant side, 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HCC) 03/01/2018    Priority: Medium    Osteopenia 10/02/2017    Priority: Medium    Former smoker 12/09/2014    Priority: Medium    Anxiety state 10/27/2014    Priority: Medium    Hyperlipidemia 02/22/2010    Priority: Medium    Essential hypertension 12/07/2006    Priority: Medium    Allergic rhinitis 10/27/2014    Priority: Low   Esophageal reflux 12/31/2006    Priority: Low   Insomnia 12/31/2006    Priority: Low   MVP (mitral valve prolapse) 12/10/2006    Priority: Low   Alkaline phosphatase elevation 06/16/2019   Benign paroxysmal positional vertigo 11/13/2016   Low back pain 09/26/2016   Dysarthria 03/02/2015    Medications- reviewed and updated Current Outpatient Medications  Medication Sig Dispense Refill   azelastine (ASTELIN) 0.1 % nasal spray daily.  5   CALCIUM PO Take by mouth.     cholecalciferol (VITAMIN D) 1000 units tablet Take 2,000 Units by mouth daily.      clopidogrel (PLAVIX) 75 MG tablet TAKE 1 TABLET BY MOUTH EVERY DAY 90 tablet 3   desvenlafaxine (PRISTIQ) 100 MG 24 hr tablet TAKE 1 TABLET BY MOUTH EVERY DAY 90 tablet 4   famotidine (PEPCID) 10 MG tablet Take 10 mg by mouth as needed for heartburn or indigestion.     hydrochlorothiazide (HYDRODIURIL) 25 MG tablet TAKE 1/2 TABLET BY MOUTH DAILY 45 tablet 3   ipratropium (ATROVENT) 0.06  % nasal spray INSTILL 2 SPRAYS IN EACH NOSTRIL 3 TIMES DAILY AS NEEDED  5   KLOR-CON M10 10 MEQ tablet TAKE 1 TABLET BY MOUTH 2 TIMES A WEEK. 24 tablet 4   Melatonin 1 MG TABS Take 5 mg by mouth daily.     Multiple Vitamin (MULTIVITAMIN) tablet Take 1 tablet by mouth daily.     pantoprazole (PROTONIX) 20 MG tablet TAKE 1 TABLET BY MOUTH EVERY DAY 90 tablet 3   rosuvastatin (CRESTOR) 40 MG tablet TAKE 1 TABLET BY MOUTH EVERY DAY 90 tablet 3   buPROPion (WELLBUTRIN XL) 150 MG 24 hr tablet Take 1 tablet (150 mg total) by mouth daily. 30 tablet 5   No current facility-administered medications for this visit.     Objective:  BP 120/64   Pulse 77   Temp 97.7 F (36.5 C)   Ht 5\' 2"  (1.575 m)   Wt 157 lb 12.8 oz (71.6 kg)   LMP  (LMP Unknown)   SpO2 92%   BMI 28.86 kg/m  Gen: NAD, resting comfortably CV: RRR no murmurs rubs or gallops Lungs: CTAB no crackles, wheeze, rhonchi Ext: no edema Skin: warm, dry    Assessment and Plan   # Depression/Anxiety S: Medication: pristiq 50 mg--> 100mg , Wellbutrin added on 01/18/2023 perhaps mild energy improvement.  -prior Lexapro 15 mg   She has considered  therapy but doesn't think husband would be willing for couples counseling or her counselor- very controlling    04/02/2023    3:20 PM 01/18/2023    2:31 PM 07/18/2022    3:14 PM  Depression screen PHQ 2/9  Decreased Interest 2 2 0  Down, Depressed, Hopeless 2 0 0  PHQ - 2 Score 4 2 0  Altered sleeping 2 1 0  Tired, decreased energy 2 2 0  Change in appetite 0 0 0  Feeling bad or failure about yourself  0 0 0  Trouble concentrating 0 0 0  Moving slowly or fidgety/restless 0 0 0  Suicidal thoughts 0 0 0  PHQ-9 Score 8 5 0  Difficult doing work/chores Somewhat difficult Somewhat difficult Not difficult at all  -anxiety is well controlled thankfully with General Anxiety Disorder - 7 question screening survey of 2 A/P: depression did not see substantial improvements- we discussed either  weaning off or trial of slightly higher dose- we agreed to go up to 300 mg and if not beneficial by 2 month follow up considering weaning back off -any thoughts of self harm call 911 or [phone]  #hypertension S: medication: hctz 25 mg - takes half tablet daily with potassium 4 days a week Home readings #s: rarely checks  -has been exercising- [facility]- walking and doing machines. Has done more salads-she has lost 1 pounds  BP Readings from Last 3 Encounters:  04/02/23 120/64  01/18/23 120/74  07/18/22 110/70  A/P: stable- continue current medicines  -potassium slightly low last visit- update today  Recommended follow up: Return in about 2 months (around 06/02/2023) for followup or sooner if needed.Schedule b4 you leave. Future Appointments  Date Time Provider Department Center  07/03/2023  4:00 PM [facility] ANNUAL WELLNESS VISIT 1 [facility]   Lab/Order associations:   ICD-10-CM   1. Essential hypertension  I10 Basic Metabolic Panel (BMET)    2. Depression, major, single episode, complete remission (HCC)  F32.5       Meds ordered this encounter  Medications   buPROPion (WELLBUTRIN XL) 300 MG 24 hr tablet    Sig: Take 1 tablet (300 mg total) by mouth daily.    Dispense:  30 tablet    Refill:  5    Return precautions advised.  Tana Conch, MD

## 2023-04-03 LAB — BASIC METABOLIC PANEL
BUN: 13 mg/dL (ref 6–23)
CO2: 31 meq/L (ref 19–32)
Calcium: 8.9 mg/dL (ref 8.4–10.5)
Chloride: 102 meq/L (ref 96–112)
Creatinine, Ser: 0.67 mg/dL (ref 0.40–1.20)
GFR: 88.58 mL/min (ref >60.00)
Glucose, Bld: 76 mg/dL (ref 70–99)
Potassium: 3.4 meq/L — ABNORMAL LOW (ref 3.5–5.1)
Sodium: 141 meq/L (ref 135–145)

## 2023-04-04 ENCOUNTER — Other Ambulatory Visit: Payer: Self-pay

## 2023-04-04 DIAGNOSIS — R062 Wheezing: Secondary | ICD-10-CM | POA: Diagnosis not present

## 2023-04-04 DIAGNOSIS — J3089 Other allergic rhinitis: Secondary | ICD-10-CM | POA: Diagnosis not present

## 2023-04-04 DIAGNOSIS — J300 Vasomotor rhinitis: Secondary | ICD-10-CM | POA: Diagnosis not present

## 2023-04-04 DIAGNOSIS — J301 Allergic rhinitis due to pollen: Secondary | ICD-10-CM | POA: Diagnosis not present

## 2023-04-04 MED ORDER — POTASSIUM CHLORIDE CRYS ER 10 MEQ PO TBCR: EXTENDED_RELEASE_TABLET | ORAL | 3 refills | Status: DC

## 2023-04-27 ENCOUNTER — Other Ambulatory Visit: Payer: Self-pay | Admitting: Family Medicine

## 2023-05-17 ENCOUNTER — Other Ambulatory Visit: Payer: Self-pay

## 2023-05-17 ENCOUNTER — Ambulatory Visit: Payer: Medicare Other | Admitting: Orthopedic Surgery

## 2023-05-17 ENCOUNTER — Other Ambulatory Visit (INDEPENDENT_AMBULATORY_CARE_PROVIDER_SITE_OTHER): Payer: Medicare Other

## 2023-05-17 DIAGNOSIS — M6701 Short Achilles tendon (acquired), right ankle: Secondary | ICD-10-CM | POA: Diagnosis not present

## 2023-05-17 DIAGNOSIS — M79672 Pain in left foot: Secondary | ICD-10-CM

## 2023-05-17 DIAGNOSIS — M79671 Pain in right foot: Secondary | ICD-10-CM

## 2023-05-17 DIAGNOSIS — M6702 Short Achilles tendon (acquired), left ankle: Secondary | ICD-10-CM

## 2023-05-17 NOTE — Progress Notes (Signed)
 Office Visit Note   Patient: Brianna Simon           Date of Birth: 12-05-1950           MRN: [id number] Visit Date: 05/17/2023              Requested by: Katrinka Garnette KIDD, MD [address] [place],  [place] [address] PCP: Katrinka Garnette KIDD, MD  Chief Complaint  Patient presents with   Left Foot - Pain   Right Foot - Pain      HPI: Patient is a 73 year old woman who is seen for initial evaluation for bilateral foot pain.  Patient complains of pain over the origin of the plantar fascia bilaterally and pain beneath the fifth metatarsal head left foot.  Patient has had the callus pared beneath the fifth metatarsal head in the past.  Patient states the pain gets worse as the day progresses.  Patient states she has had the plantar fascia injection 4-5 times.  Assessment & Plan: Visit Diagnoses:  1. Bilateral foot pain   2. Contracture of both Achilles tendons     Plan: Recommended Achilles stretching this was demonstrated as well as a stiff soled shoe.  Follow-Up Instructions: Return if symptoms worsen or fail to improve.   Ortho Exam  Patient is alert, oriented, no adenopathy, well-dressed, normal affect, normal respiratory effort. Examination patient with a palpable dorsalis pedis pulse bilaterally she has good ankle good subtalar motion.  There is no functional motor deficit in either foot.  With the knee extended she has dorsiflexion to neutral.  The plantar fascia is tender to palpation bilaterally at the origin.  No palpable fibromatosis.  Imaging: XR Foot 2 Views Right Result Date: 05/17/2023 2 view radiographs of the right foot shows no bony abnormalities.  No fractures.  No periarticular cystic changes.  XR Foot 2 Views Left Result Date: 05/17/2023 2 view radiographs of the left foot shows no bony abnormalities.  No fractures.  No periarticular cystic changes.  No images are attached to the encounter.  Labs: Lab Results  Component Value Date   HGBA1C  5.5 02/27/2015   HGBA1C  06/02/2009    5.4 (NOTE) The ADA recommends the following therapeutic goal for glycemic control related to Hgb A1c measurement: Goal of therapy: <6.5 Hgb A1c  Reference: American Diabetes Association: Clinical Practice Recommendations 2010, Diabetes Care, 2010, 33: (Suppl  1).     Lab Results  Component Value Date   ALBUMIN 3.8 01/18/2023   ALBUMIN 3.8 07/18/2022   ALBUMIN 3.8 01/17/2022    No results found for: MG Lab Results  Component Value Date   VD25OH 67.55 01/18/2023   VD25OH 41.65 05/06/2019    No results found for: PREALBUMIN    Latest Ref Rng & Units 01/18/2023    2:58 PM 07/18/2022    3:38 PM 07/15/2021    2:20 PM  CBC EXTENDED  WBC 4.0 - 10.5 K/uL 5.8  5.4  5.4   RBC 3.87 - 5.11 Mil/uL 4.77  4.91  4.77   Hemoglobin 12.0 - 15.0 g/dL 85.5  85.1  85.4   HCT 36.0 - 46.0 % 43.8  44.0  42.6   Platelets 150.0 - 400.0 K/uL 297.0  303.0  261.0   NEUT# 1.4 - 7.7 K/uL 3.7  3.4  3.5   Lymph# 0.7 - 4.0 K/uL 1.3  1.3  1.3      There is no height or weight on file to calculate BMI.  Orders:  Orders Placed This Encounter  Procedures   XR Foot 2 Views Right   XR Foot 2 Views Left   No orders of the defined types were placed in this encounter.    Procedures: No procedures performed  Clinical Data: No additional findings.  ROS:  All other systems negative, except as noted in the HPI. Review of Systems  Objective: Vital Signs: LMP  (LMP Unknown)   Specialty Comments:  No specialty comments available.  PMFS History: Patient Active Problem List   Diagnosis Date Noted   Fatty liver 05/24/2020   Depression, major, single episode, complete remission (HCC) 11/14/2019   Alkaline phosphatase elevation 06/16/2019   Hemiplegia affecting dominant side, 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HCC) 03/01/2018   Osteopenia 10/02/2017   Benign paroxysmal positional vertigo 11/13/2016   Low back pain 09/26/2016   Dysarthria 03/02/2015   Former smoker 12/09/2014    Allergic rhinitis 10/27/2014   Anxiety state 10/27/2014   Hyperlipidemia 02/22/2010   History of CVA (cerebrovascular accident) 02/22/2010   Esophageal reflux 12/31/2006   Insomnia 12/31/2006   MVP (mitral valve prolapse) 12/10/2006   Essential hypertension 12/07/2006   Past Medical History:  Diagnosis Date   Anxiety    Arthritis    hips, hands, lower back - otc med prn   Cancer (HCC)    skin cancer left wrist and left chest   GERD (gastroesophageal reflux disease)    Hx: UTI (urinary tract infection)    Hyperlipidemia    Hypertension    Mitral valve prolapse    does not cause patient any problems   Seasonal allergies    Stroke (HCC) 2011   mild   SVD (spontaneous vaginal delivery)    x 1 - gave up for adoption    Family History  Problem Relation Age of Onset   Arrhythmia Mother 24       pacemaker    Stroke Mother    Lung cancer Brother        former smoker- agent orange related as well   Stroke Paternal Grandmother    Breast cancer Maternal Aunt    Breast cancer Cousin    Diverticulitis Other     Past Surgical History:  Procedure Laterality Date   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    cataract surgery     july 2024 and aug 2023   COLONOSCOPY     DILATATION & CURETTAGE/HYSTEROSCOPY WITH TRUECLEAR N/A 10/24/2013   Procedure: DILATATION & CURETTAGE/HYSTEROSCOPY WITH TRUCLEAR;  Surgeon: Nathanel LELON Bunker, MD;  Location: [facility];  Service: Gynecology;  Laterality: N/A;  1 1/2hrs OR time   EP IMPLANTABLE DEVICE N/A 03/02/2015   Procedure: Loop Recorder Insertion;  Surgeon: Will Gladis Norton, MD;  Location: [facility];  Service: Cardiovascular;  Laterality: N/A;   lipoma removed     right shoulder   SINUS SURGERY WITH INSTATRAK     WISDOM TOOTH EXTRACTION     Social History   Occupational History   Occupation: [employer]    Employer: [employer]  Tobacco Use   Smoking status: Former    Current packs/day: 0.00     Average packs/day: 2.0 packs/day for 30.0 years (60.0 ttl pk-yrs)    Types: Cigarettes    Start date: 03/16/1973    Quit date: 03/17/2003    Years since quitting: 20.1   Smokeless tobacco: Never  Substance and Sexual Activity   Alcohol use: Yes    Alcohol/week:  3.0 standard drinks of alcohol    Types: 1 Glasses of wine, 1 Cans of beer, 1 Shots of liquor per week    Comment: occasionally   Drug use: No   Sexual activity: Yes    Birth control/protection: Post-menopausal

## 2023-05-23 ENCOUNTER — Other Ambulatory Visit: Payer: Self-pay | Admitting: Obstetrics and Gynecology

## 2023-05-23 DIAGNOSIS — Z1231 Encounter for screening mammogram for malignant neoplasm of breast: Secondary | ICD-10-CM

## 2023-06-04 ENCOUNTER — Ambulatory Visit (INDEPENDENT_AMBULATORY_CARE_PROVIDER_SITE_OTHER): Payer: Medicare Other | Admitting: Family Medicine

## 2023-06-04 ENCOUNTER — Encounter: Payer: Self-pay | Admitting: Family Medicine

## 2023-06-04 VITALS — BP 114/50 | HR 79 | Temp 97.7°F | Ht 62.0 in | Wt 155.4 lb

## 2023-06-04 DIAGNOSIS — I10 Essential (primary) hypertension: Secondary | ICD-10-CM | POA: Diagnosis not present

## 2023-06-04 DIAGNOSIS — F411 Generalized anxiety disorder: Secondary | ICD-10-CM | POA: Diagnosis not present

## 2023-06-04 DIAGNOSIS — F325 Major depressive disorder, single episode, in full remission: Secondary | ICD-10-CM | POA: Diagnosis not present

## 2023-06-04 DIAGNOSIS — I69359 Hemiplegia and hemiparesis following cerebral infarction affecting unspecified side: Secondary | ICD-10-CM

## 2023-06-04 NOTE — Patient Instructions (Addendum)
Thrilled you are doing better- continue current medications   Great job on weight loss through the holidays  Recommended follow up: Return in about 6 months (around 12/02/2023) for followup or sooner if needed.Schedule b4 you leave.

## 2023-06-04 NOTE — Progress Notes (Signed)
Phone [phone] In person visit   Subjective:   Brianna Simon is a 73 y.o. year old very pleasant female patient who presents for/with See problem oriented charting Chief Complaint  Patient presents with   Hypertension   Past Medical History-  Patient Active Problem List   Diagnosis Date Noted   History of CVA (cerebrovascular accident) 02/22/2010    Priority: High   Fatty liver 05/24/2020    Priority: Medium    Depression, major, single episode, complete remission (HCC) 11/14/2019    Priority: Medium    Hemiplegia affecting dominant side, 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HCC) 03/01/2018    Priority: Medium    Osteopenia 10/02/2017    Priority: Medium    Former smoker 12/09/2014    Priority: Medium    Anxiety state 10/27/2014    Priority: Medium    Hyperlipidemia 02/22/2010    Priority: Medium    Essential hypertension 12/07/2006    Priority: Medium    Allergic rhinitis 10/27/2014    Priority: Low   Esophageal reflux 12/31/2006    Priority: Low   Insomnia 12/31/2006    Priority: Low   MVP (mitral valve prolapse) 12/10/2006    Priority: Low   Alkaline phosphatase elevation 06/16/2019   Benign paroxysmal positional vertigo 11/13/2016   Low back pain 09/26/2016   Dysarthria 03/02/2015    Medications- reviewed and updated Current Outpatient Medications  Medication Sig Dispense Refill   azelastine (ASTELIN) 0.1 % nasal spray daily.  5   buPROPion (WELLBUTRIN XL) 300 MG 24 hr tablet TAKE 1 TABLET BY MOUTH EVERY DAY 90 tablet 2   CALCIUM PO Take by mouth.     cholecalciferol (VITAMIN D) 1000 units tablet Take 2,000 Units by mouth daily.      clopidogrel (PLAVIX) 75 MG tablet TAKE 1 TABLET BY MOUTH EVERY DAY 90 tablet 3   desvenlafaxine (PRISTIQ) 100 MG 24 hr tablet TAKE 1 TABLET BY MOUTH EVERY DAY 90 tablet 4   famotidine (PEPCID) 10 MG tablet Take 10 mg by mouth as needed for heartburn or indigestion.     hydrochlorothiazide (HYDRODIURIL) 25 MG tablet TAKE 1/2 TABLET BY  MOUTH DAILY 45 tablet 3   ipratropium (ATROVENT) 0.06 % nasal spray INSTILL 2 SPRAYS IN EACH NOSTRIL 3 TIMES DAILY AS NEEDED  5   Melatonin 1 MG TABS Take 5 mg by mouth daily.     Multiple Vitamin (MULTIVITAMIN) tablet Take 1 tablet by mouth daily.     pantoprazole (PROTONIX) 20 MG tablet TAKE 1 TABLET BY MOUTH EVERY DAY 90 tablet 3   potassium chloride (KLOR-CON M10) 10 MEQ tablet Take 1 tablet 5 days a week. 65 tablet 3   rosuvastatin (CRESTOR) 40 MG tablet TAKE 1 TABLET BY MOUTH EVERY DAY 90 tablet 3   No current facility-administered medications for this visit.     Objective:  BP (!) 114/50   Pulse 79   Temp 97.7 F (36.5 C)   Ht 5\' 2"  (1.575 m)   Wt 155 lb 6.4 oz (70.5 kg)   LMP  (LMP Unknown)   SpO2 96%   BMI 28.42 kg/m  Gen: NAD, resting comfortably CV: RRR no murmurs rubs or gallops Lungs: CTAB no crackles, wheeze, rhonchi  Ext: no edema Skin: warm, dry    Assessment and Plan   # Depression/Anxiety S: Medication: pristiq 50 mg--> 100mg , Wellbutrin added on 01/18/2023 then up to 300 mg. She has felt better with this regimen. Feels more motivated -prior Lexapro 15 mg  06/04/2023    3:23 PM 04/02/2023    3:37 PM 01/18/2023    2:31 PM 11/23/2020    3:14 PM  GAD 7 : Generalized Anxiety Score  Nervous, Anxious, on Edge 0 0 1 0  Control/stop worrying 0 0 1 0  Worry too much - different things 0 0 1 1  Trouble relaxing 0 0 1 0  Restless 0 0 0 3  Easily annoyed or irritable 0 2 2 1   Afraid - awful might happen 0 0 0 0  Total GAD 7 Score 0 2 6 5   Anxiety Difficulty Not difficult at all Not difficult at all Somewhat difficult Not difficult at all       06/04/2023    4:12 PM 06/04/2023    3:22 PM 04/02/2023    3:36 PM  Depression screen PHQ 2/9  Decreased Interest 0 0 2  Down, Depressed, Hopeless 0 0 2  PHQ - 2 Score 0 0 4  Altered sleeping 1  2  Tired, decreased energy 1  2  Change in appetite 0  2  Feeling bad or failure about yourself  0  0  Trouble  concentrating 1  0  Moving slowly or fidgety/restless 0  0  Suicidal thoughts 0  0  PHQ-9 Score 3  10  Difficult doing work/chores Not difficult at all  Somewhat difficult  A/P: depression in full remission and anxiety well controlled continue current medications and can go back to 6 month follow up since imprved  #hypertension S: medication: hctz 25 mg - takes half tablet daily with potassium 5 days a week Home readings #s: rarely checks  -down a few more lbs- congratultaed BP Readings from Last 3 Encounters:  06/04/23 (!) 114/50  04/02/23 120/64  01/18/23 120/74  A/P: blood pressure reasonable /well controlled - continue current medications   #hemiplegia- about the same- no recent change- remains on Plavix for stroke prevention and statin.  Occasionally stil hard to pick things up  Recommended follow up: Return in about 6 months (around 12/02/2023) for followup or sooner if needed.Schedule b4 you leave. Future Appointments  Date Time Provider Department Center  06/07/2023  3:10 PM [facility] MM 2 [facility]  06/25/2023  3:00 PM [facility] ANNUAL WELLNESS VISIT 1 [facility]   Lab/Order associations:   ICD-10-CM   1. Depression, major, single episode, complete remission (HCC)  F32.5     2. Anxiety state  F41.1     3. Essential hypertension  I10     4. Hemiplegia affecting dominant side, 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HCC) Chronic I69.359      No orders of the defined types were placed in this encounter.  Return precautions advised.  Tana Conch, MD

## 2023-06-07 ENCOUNTER — Ambulatory Visit
Admission: RE | Admit: 2023-06-07 | Discharge: 2023-06-07 | Disposition: A | Discharge status: Home / Self Care | Payer: Medicare Other | Source: Ambulatory Visit | Attending: Obstetrics and Gynecology | Admitting: Obstetrics and Gynecology

## 2023-06-07 DIAGNOSIS — Z1231 Encounter for screening mammogram for malignant neoplasm of breast: Secondary | ICD-10-CM

## 2023-06-11 DIAGNOSIS — L814 Other melanin hyperpigmentation: Secondary | ICD-10-CM | POA: Diagnosis not present

## 2023-06-11 DIAGNOSIS — L570 Actinic keratosis: Secondary | ICD-10-CM | POA: Diagnosis not present

## 2023-06-11 DIAGNOSIS — Z85828 Personal history of other malignant neoplasm of skin: Secondary | ICD-10-CM | POA: Diagnosis not present

## 2023-06-11 DIAGNOSIS — L821 Other seborrheic keratosis: Secondary | ICD-10-CM | POA: Diagnosis not present

## 2023-06-11 DIAGNOSIS — L438 Other lichen planus: Secondary | ICD-10-CM | POA: Diagnosis not present

## 2023-06-12 ENCOUNTER — Other Ambulatory Visit: Payer: Self-pay | Admitting: Obstetrics and Gynecology

## 2023-06-12 DIAGNOSIS — R928 Other abnormal and inconclusive findings on diagnostic imaging of breast: Secondary | ICD-10-CM

## 2023-06-24 DIAGNOSIS — Z23 Encounter for immunization: Secondary | ICD-10-CM | POA: Diagnosis not present

## 2023-06-25 ENCOUNTER — Ambulatory Visit (INDEPENDENT_AMBULATORY_CARE_PROVIDER_SITE_OTHER): Payer: Medicare Other

## 2023-06-25 VITALS — BP 100/62 | HR 84 | Temp 99.4°F | Wt 155.4 lb

## 2023-06-25 DIAGNOSIS — Z0000 Encounter for general adult medical examination without abnormal findings: Secondary | ICD-10-CM | POA: Diagnosis not present

## 2023-06-25 NOTE — Patient Instructions (Signed)
 Ms. Janz , Thank you for taking time to come for your Medicare Wellness Visit. I appreciate your ongoing commitment to your health goals. Please review the following plan we discussed and let me know if I can assist you in the future.   Referrals/Orders/Follow-Ups/Clinician Recommendations: lose weight  Aim for 30 minutes of exercise or brisk walking, 6-8 glasses of water, and 5 servings of fruits and vegetables each day.   This is a list of the screening recommended for you and due dates:  Health Maintenance  Topic Date Due   COVID-19 Vaccine (8 - 2024-25 season) 01/14/2023   Medicare Annual Wellness Visit  06/21/2023   Cologuard (Stool DNA test)  08/02/2024   DTaP/Tdap/Td vaccine (2 - Td or Tdap) 10/26/2024   Mammogram  06/06/2025   Pneumonia Vaccine  Completed   Flu Shot  Completed   DEXA scan (bone density measurement)  Completed   Hepatitis C Screening  Completed   Zoster (Shingles) Vaccine  Completed   HPV Vaccine  Aged Out    Advanced directives: (Declined) Advance directive discussed with you today. Even though you declined this today, please call our office should you change your mind, and we can give you the proper paperwork for you to fill out.  Next Medicare Annual Wellness Visit scheduled for next year: Yes

## 2023-06-25 NOTE — Progress Notes (Signed)
 Subjective:   Brianna Simon is a 73 y.o. female who presents for Medicare Annual (Subsequent) preventive examination.  Visit Complete: In person  Cardiac Risk Factors include: advanced age (>42men, >20 women);dyslipidemia;hypertension     Objective:    Today's Vitals   06/25/23 1506  BP: 100/62  Pulse: 84  Temp: 99.4 F (37.4 C)  SpO2: 94%  Weight: 155 lb 6.4 oz (70.5 kg)   Body mass index is 28.42 kg/m.     06/25/2023    3:19 PM 06/20/2022    4:11 PM 06/17/2021    2:39 PM 06/11/2020    2:43 PM 04/28/2019    3:28 PM 02/27/2015    8:50 AM 02/26/2015    8:00 PM  Advanced Directives  Does Patient Have a Medical Advance Directive? No Yes Yes Yes Yes No No  Type of Special educational needs teacher of [place];Living will Healthcare Power of [employer] of [place];Living will Living will;Healthcare Power of Attorney    Does patient want to make changes to medical advance directive?     No - Patient declined    Copy of Healthcare Power of Attorney in Chart?  No - copy requested No - copy requested No - copy requested No - copy requested    Would patient like information on creating a medical advance directive? No - Patient declined          Current Medications (verified) Outpatient Encounter Medications as of 06/25/2023  Medication Sig   azelastine (ASTELIN) 0.1 % nasal spray daily.   BIOTIN PO Take by mouth.   buPROPion  (WELLBUTRIN  XL) 300 MG 24 hr tablet TAKE 1 TABLET BY MOUTH EVERY DAY   CALCIUM  PO Take by mouth.   cholecalciferol (VITAMIN D ) 1000 units tablet Take 2,000 Units by mouth daily.    clopidogrel  (PLAVIX ) 75 MG tablet TAKE 1 TABLET BY MOUTH EVERY DAY   desloratadine (CLARINEX) 5 MG tablet Take 5 mg by mouth daily.   desvenlafaxine  (PRISTIQ ) 100 MG 24 hr tablet TAKE 1 TABLET BY MOUTH EVERY DAY   famotidine  (PEPCID ) 10 MG tablet Take 10 mg by mouth as needed for heartburn or indigestion.   hydrochlorothiazide  (HYDRODIURIL ) 25 MG tablet  TAKE 1/2 TABLET BY MOUTH DAILY   ipratropium (ATROVENT) 0.06 % nasal spray INSTILL 2 SPRAYS IN EACH NOSTRIL 3 TIMES DAILY AS NEEDED   Melatonin 1 MG TABS Take 5 mg by mouth daily.   Multiple Vitamin (MULTIVITAMIN) tablet Take 1 tablet by mouth daily.   pantoprazole  (PROTONIX ) 20 MG tablet TAKE 1 TABLET BY MOUTH EVERY DAY   potassium chloride  (KLOR-CON  M10) 10 MEQ tablet Take 1 tablet 5 days a week.   rosuvastatin  (CRESTOR ) 40 MG tablet TAKE 1 TABLET BY MOUTH EVERY DAY   No facility-administered encounter medications on file as of 06/25/2023.    Allergies (verified) Citalopram hydrobromide, Codeine phosphate, Erythromycin, Nitrofurantoin, Nsaids, Sulfamethoxazole, and Sulfamethoxazole-trimethoprim   History: Past Medical History:  Diagnosis Date   Anxiety    Arthritis    hips, hands, lower back - otc med prn   Cancer (HCC)    skin cancer left wrist and left chest   GERD (gastroesophageal reflux disease)    Hx: UTI (urinary tract infection)    Hyperlipidemia    Hypertension    Mitral valve prolapse    does not cause patient any problems   Seasonal allergies    Stroke (HCC) 2011   mild   SVD (spontaneous vaginal delivery)    x  1 - gave up for adoption   Past Surgical History:  Procedure Laterality Date   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    cataract surgery     july 2024 and aug 2023   COLONOSCOPY     DILATATION & CURETTAGE/HYSTEROSCOPY WITH TRUECLEAR N/A 10/24/2013   Procedure: DILATATION & CURETTAGE/HYSTEROSCOPY WITH TRUCLEAR;  Surgeon: Adrianna Albee, MD;  Location: [facility];  Service: Gynecology;  Laterality: N/A;  1 1/2hrs OR time   EP IMPLANTABLE DEVICE N/A 03/02/2015   Procedure: Loop Recorder Insertion;  Surgeon: Will Cortland Ding, MD;  Location: [facility];  Service: Cardiovascular;  Laterality: N/A;   lipoma removed     right shoulder   SINUS SURGERY WITH INSTATRAK     WISDOM TOOTH EXTRACTION     Family History  Problem  Relation Age of Onset   Arrhythmia Mother 27       pacemaker    Stroke Mother    Lung cancer Brother        former smoker- agent orange related as well   Stroke Paternal Grandmother    Breast cancer Maternal Aunt    Breast cancer Cousin    Diverticulitis Other    Social History   Socioeconomic History   Marital status: Married    Spouse name: Not on file   Number of children: Not on file   Years of education: Not on file   Highest education level: Some college, no degree  Occupational History   Occupation: [employer]    Employer: [employer]  Tobacco Use   Smoking status: Former    Current packs/day: 0.00    Average packs/day: 2.0 packs/day for 30.0 years (60.0 ttl pk-yrs)    Types: Cigarettes    Start date: 03/16/1973    Quit date: 03/17/2003    Years since quitting: 20.2   Smokeless tobacco: Never  Substance and Sexual Activity   Alcohol use: Yes    Alcohol/week: 3.0 standard drinks of alcohol    Types: 1 Glasses of wine, 1 Cans of beer, 1 Shots of liquor per week    Comment: occasionally   Drug use: No   Sexual activity: Yes    Birth control/protection: Post-menopausal  Other Topics Concern   Not on file  Social History Narrative   Married. No children. Husband patient of Dr. Arlene Ben      Disabled after strokes. Delivered mail previously.       Hobbies: dancing, time out with husband, enjoys concerts   Social Drivers of Corporate investment banker Strain: Low Risk  (06/25/2023)   Overall Financial Resource Strain (CARDIA)    Difficulty of Paying Living Expenses: Not hard at all  Food Insecurity: No Food Insecurity (06/25/2023)   Hunger Vital Sign    Worried About Running Out of Food in the Last Year: Never true    Ran Out of Food in the Last Year: Never true  Transportation Needs: No Transportation Needs (06/25/2023)   PRAPARE - Administrator, Civil Service (Medical): No    Lack of Transportation (Non-Medical): No  Physical Activity:  Sufficiently Active (06/25/2023)   Exercise Vital Sign    Days of Exercise per Week: 4 days    Minutes of Exercise per Session: 40 min  Stress: Stress Concern Present (06/25/2023)   [employer] of Occupational Health - Occupational Stress Questionnaire    Feeling of Stress : To some extent  Social Connections: Moderately Isolated (06/25/2023)   Social Connection and Isolation Panel [NHANES]    Frequency of Communication with Friends and Family: More than three times a week    Frequency of Social Gatherings with Friends and Family: More than three times a week    Attends Religious Services: Never    Database administrator or Organizations: No    Attends Engineer, structural: Never    Marital Status: Married    Tobacco Counseling Counseling given: Not Answered   Clinical Intake:  Pre-visit preparation completed: Yes  Pain : No/denies pain     BMI - recorded: 28.42 Nutritional Status: BMI 25 -29 Overweight Nutritional Risks: None Diabetes: No  How often do you need to have someone help you when you read instructions, pamphlets, or other written materials from your doctor or pharmacy?: 1 - Never  Interpreter Needed?: No  Information entered by :: Charmain Coons, LPN   Activities of Daily Living    06/25/2023    3:11 PM  In your present state of health, do you have any difficulty performing the following activities:  Hearing? 0  Vision? 0  Difficulty concentrating or making decisions? 0  Walking or climbing stairs? 0  Dressing or bathing? 0  Doing errands, shopping? 0  Preparing Food and eating ? N  Using the Toilet? N  In the past six months, have you accidently leaked urine? N  Do you have problems with loss of bowel control? N  Managing your Medications? N  Managing your Finances? N  Housekeeping or managing your Housekeeping? N    Patient Care Team: Almira Jaeger, MD as PCP - General (Family Medicine) Rogene Claude, MD as Consulting  Physician (Obstetrics and Gynecology) Camilo Cella, DPM (Inactive) as Consulting Physician (Podiatry) Lisabeth Rider, MD as Consulting Physician (Neurology) [facility] (Ophthalmology) Myrle Aspen, [facility] (Inactive) (Pharmacist)  Indicate any recent Medical Services you may have received from other than [facility] providers in the past year (date may be approximate).     Assessment:   This is a routine wellness examination for [place].  Hearing/Vision screen Hearing Screening - Comments:: Pt denies any hearing issues  Vision Screening - Comments:: Pt follows up with dr Rockie Churchman for annual eye exams    Goals Addressed             This Visit's Progress    Patient Stated       Lose weight        Depression Screen    06/25/2023    3:16 PM 06/04/2023    4:12 PM 06/04/2023    3:22 PM 04/02/2023    3:36 PM 04/02/2023    3:20 PM 01/18/2023    2:31 PM 07/18/2022    3:14 PM  PHQ 2/9 Scores  PHQ - 2 Score 0 0 0 4 4 2  0  PHQ- 9 Score 0 3  10 8 5  0    Fall Risk    06/25/2023    3:18 PM 06/04/2023    3:22 PM 04/02/2023    3:36 PM 01/18/2023    2:31 PM 06/19/2022    4:43 PM  Fall Risk   Falls in the past year? 0 0 0 0 1  Number falls in past yr: 0 0 0 0 1  Injury with Fall? 0 0 0 0 0  Risk for fall due to : Impaired balance/gait No Fall Risks No Fall Risks No Fall Risks Impaired vision  Follow  up Falls prevention discussed;Falls evaluation completed Falls evaluation completed Falls evaluation completed Falls evaluation completed Falls prevention discussed    MEDICARE RISK AT HOME: Medicare Risk at Home Any stairs in or around the home?: No If so, are there any without handrails?: No Home free of loose throw rugs in walkways, pet beds, electrical cords, etc?: Yes Adequate lighting in your home to reduce risk of falls?: Yes Life alert?: Yes (bay alarm) Use of a cane, walker or w/c?: No Grab bars in the bathroom?: Yes Shower chair or bench in shower?:  Yes Elevated toilet seat or a handicapped toilet?: Yes  TIMED UP AND GO:  Was the test performed?  Yes  Length of time to ambulate 10 feet: 10 sec Gait steady and fast without use of assistive device    Cognitive Function:        06/25/2023    3:20 PM 06/20/2022    4:15 PM 06/17/2021    2:43 PM 06/11/2020    2:47 PM 04/28/2019    3:29 PM  6CIT Screen  What Year? 0 points 0 points 0 points 0 points 0 points  What month? 0 points 0 points 0 points 0 points 0 points  What time? 0 points 0 points 0 points  0 points  Count back from 20 0 points 0 points 0 points 0 points 0 points  Months in reverse 0 points 0 points 0 points 0 points 0 points  Repeat phrase 0 points 0 points 0 points 0 points 0 points  Total Score 0 points 0 points 0 points  0 points    Immunizations Immunization History  Administered Date(s) Administered   Fluad Quad(high Dose 65+) 03/04/2019, 03/18/2020, 02/24/2021, 01/30/2022   Fluad Trivalent(High Dose 65+) 03/31/2023   Influenza Split 03/17/2011, 02/09/2012   Influenza Whole 02/12/2008, 02/22/2010   Influenza, High Dose Seasonal PF 02/27/2018   Influenza,inj,Quad PF,6+ Mos 02/10/2013, 03/09/2014, 02/05/2015, 02/25/2016, 02/23/2017   Moderna Covid-19 Fall Seasonal Vaccine 1yrs & older 07/10/2022   Moderna Sars-Covid-2 Vaccination 04/09/2021   PFIZER Comirnaty(Gray Top)Covid-19 Tri-Sucrose Vaccine 09/10/2020   PFIZER(Purple Top)SARS-COV-2 Vaccination 06/20/2019, 07/11/2019, 03/01/2020, 09/10/2020   PNEUMOCOCCAL CONJUGATE-20 11/23/2020   Pneumococcal Conjugate-13 11/14/2019   Pneumococcal Polysaccharide-23 11/05/2018   Tdap 10/27/2014   Zoster Recombinant(Shingrix ) 09/26/2016, 12/26/2016   Zoster, Live 04/03/2013    TDAP status: Up to date  Flu Vaccine status: Up to date  Pneumococcal vaccine status: Up to date  Covid-19 vaccine status: Information provided on how to obtain vaccines.   Qualifies for Shingles Vaccine? Yes   Zostavax completed Yes    Shingrix  Completed?: Yes  Screening Tests Health Maintenance  Topic Date Due   COVID-19 Vaccine (8 - 2024-25 season) 01/14/2023   Medicare Annual Wellness (AWV)  06/24/2024   Fecal DNA (Cologuard)  08/02/2024   DTaP/Tdap/Td (2 - Td or Tdap) 10/26/2024   MAMMOGRAM  06/06/2025   Pneumonia Vaccine 80+ Years old  Completed   INFLUENZA VACCINE  Completed   DEXA SCAN  Completed   Hepatitis C Screening  Completed   Zoster Vaccines- Shingrix   Completed   HPV VACCINES  Aged Out    Health Maintenance  Health Maintenance Due  Topic Date Due   COVID-19 Vaccine (8 - 2024-25 season) 01/14/2023    Colorectal cancer screening: Type of screening: Colonoscopy. Completed 08/02/21. Repeat every 3/ years  Mammogram status: Completed 06/07/23. Repeat every year  Bone Density status: Completed 01/11/23. Results reflect: Bone density results: OSTEOPENIA. Repeat every 2 years.   Additional Screening:  Hepatitis C Screening: Completed 10/27/14  Vision Screening: Recommended annual ophthalmology exams for early detection of glaucoma and other disorders of the eye. Is the patient up to date with their annual eye exam?  Yes  Who is the provider or what is the name of the office in which the patient attends annual eye exams? Dr Rockie Churchman  If pt is not established with a provider, would they like to be referred to a provider to establish care? No .   Dental Screening: Recommended annual dental exams for proper oral hygiene    Community Resource Referral / Chronic Care Management: CRR required this visit?  No   CCM required this visit?  No     Plan:     I have personally reviewed and noted the following in the patient's chart:   Medical and social history Use of alcohol, tobacco or illicit drugs  Current medications and supplements including opioid prescriptions. Patient is not currently taking opioid prescriptions. Functional ability and status Nutritional status Physical  activity Advanced directives List of other physicians Hospitalizations, surgeries, and ER visits in previous 12 months Vitals Screenings to include cognitive, depression, and falls Referrals and appointments  In addition, I have reviewed and discussed with patient certain preventive protocols, quality metrics, and best practice recommendations. A written personalized care plan for preventive services as well as general preventive health recommendations were provided to patient.     Bruno Capri, LPN   1/61/0960   After Visit Summary: (In Person-Printed) AVS printed and given to the patient  Nurse Notes: none

## 2023-06-26 DIAGNOSIS — Z01419 Encounter for gynecological examination (general) (routine) without abnormal findings: Secondary | ICD-10-CM | POA: Diagnosis not present

## 2023-06-27 ENCOUNTER — Ambulatory Visit
Admission: RE | Admit: 2023-06-27 | Discharge: 2023-06-27 | Disposition: A | Discharge status: Home / Self Care | Payer: Medicare Other | Source: Ambulatory Visit | Attending: Obstetrics and Gynecology | Admitting: Obstetrics and Gynecology

## 2023-06-27 DIAGNOSIS — R928 Other abnormal and inconclusive findings on diagnostic imaging of breast: Secondary | ICD-10-CM | POA: Diagnosis not present

## 2023-07-31 ENCOUNTER — Telehealth: Payer: Self-pay | Admitting: Family Medicine

## 2023-07-31 ENCOUNTER — Other Ambulatory Visit

## 2023-07-31 NOTE — Telephone Encounter (Signed)
 Have her remind me at upcoming visit-in fact for any patient that reaches out about this please let them know we can discuss at the next visit

## 2023-07-31 NOTE — Telephone Encounter (Signed)
 Pt would like to do a titers to see if they need an updated Measles vaccine.

## 2023-07-31 NOTE — Telephone Encounter (Signed)
 FYI

## 2023-08-13 ENCOUNTER — Encounter: Payer: Self-pay | Admitting: Family Medicine

## 2023-08-13 ENCOUNTER — Ambulatory Visit (INDEPENDENT_AMBULATORY_CARE_PROVIDER_SITE_OTHER): Admitting: Family Medicine

## 2023-08-13 VITALS — BP 110/68 | HR 81 | Ht 62.0 in | Wt 147.8 lb

## 2023-08-13 DIAGNOSIS — I779 Disorder of arteries and arterioles, unspecified: Secondary | ICD-10-CM | POA: Diagnosis not present

## 2023-08-13 DIAGNOSIS — E663 Overweight: Secondary | ICD-10-CM

## 2023-08-13 DIAGNOSIS — I10 Essential (primary) hypertension: Secondary | ICD-10-CM | POA: Diagnosis not present

## 2023-08-13 DIAGNOSIS — Z131 Encounter for screening for diabetes mellitus: Secondary | ICD-10-CM | POA: Diagnosis not present

## 2023-08-13 DIAGNOSIS — E785 Hyperlipidemia, unspecified: Secondary | ICD-10-CM | POA: Diagnosis not present

## 2023-08-13 DIAGNOSIS — Z789 Other specified health status: Secondary | ICD-10-CM | POA: Diagnosis not present

## 2023-08-13 LAB — CBC WITH DIFFERENTIAL/PLATELET
Basophils Absolute: 0 10*3/uL (ref 0.0–0.1)
Basophils Relative: 0.4 % (ref 0.0–3.0)
Eosinophils Absolute: 0.1 10*3/uL (ref 0.0–0.7)
Eosinophils Relative: 2.7 % (ref 0.0–5.0)
HCT: 42.8 % (ref 36.0–46.0)
Hemoglobin: 14.3 g/dL (ref 12.0–15.0)
Lymphocytes Relative: 28.3 % (ref 12.0–46.0)
Lymphs Abs: 1.2 10*3/uL (ref 0.7–4.0)
MCHC: 33.3 g/dL (ref 30.0–36.0)
MCV: 91.8 fl (ref 78.0–100.0)
Monocytes Absolute: 0.5 10*3/uL (ref 0.1–1.0)
Monocytes Relative: 11.8 % (ref 3.0–12.0)
Neutro Abs: 2.4 10*3/uL (ref 1.4–7.7)
Neutrophils Relative %: 56.8 % (ref 43.0–77.0)
Platelets: 272 10*3/uL (ref 150.0–400.0)
RBC: 4.66 Mil/uL (ref 3.87–5.11)
RDW: 13.7 % (ref 11.5–15.5)
WBC: 4.2 10*3/uL (ref 4.0–10.5)

## 2023-08-13 LAB — LIPID PANEL
Cholesterol: 85 mg/dL (ref 0–200)
HDL: 34.5 mg/dL — ABNORMAL LOW (ref >39.00)
LDL Cholesterol: 31 mg/dL (ref 0–99)
NonHDL: 50.96
Total CHOL/HDL Ratio: 2
Triglycerides: 98 mg/dL (ref 0.0–149.0)
VLDL: 19.6 mg/dL (ref 0.0–40.0)

## 2023-08-13 LAB — COMPREHENSIVE METABOLIC PANEL WITH GFR
ALT: 24 U/L (ref 0–35)
AST: 24 U/L (ref 0–37)
Albumin: 3.9 g/dL (ref 3.5–5.2)
Alkaline Phosphatase: 93 U/L (ref 39–117)
BUN: 14 mg/dL (ref 6–23)
CO2: 34 meq/L — ABNORMAL HIGH (ref 19–32)
Calcium: 9 mg/dL (ref 8.4–10.5)
Chloride: 102 meq/L (ref 96–112)
Creatinine, Ser: 0.61 mg/dL (ref 0.40–1.20)
GFR: 90.38 mL/min (ref >60.00)
Glucose, Bld: 78 mg/dL (ref 70–99)
Potassium: 3.1 meq/L — ABNORMAL LOW (ref 3.5–5.1)
Sodium: 143 meq/L (ref 135–145)
Total Bilirubin: 0.3 mg/dL (ref 0.2–1.2)
Total Protein: 6.8 g/dL (ref 6.0–8.3)

## 2023-08-13 LAB — HEMOGLOBIN A1C: Hgb A1c MFr Bld: 5.9 % (ref 4.6–6.5)

## 2023-08-13 MED ORDER — BUPROPION HCL ER (XL) 150 MG PO TB24: 150.0000 mg | ORAL_TABLET | Freq: Every day | ORAL | 3 refills | Status: AC

## 2023-08-13 NOTE — Patient Instructions (Addendum)
 wants to reduce Wellbutrin to 150 mg but did recommend seeing eye doctor to be on safe side with reported vision changes on medicine  Please stop by lab before you go If you have mychart- we will send your results within 3 business days of [place] receiving them.  If you do not have mychart- we will call you about results within 5 business days of [place] receiving them.  *please also note that you will see labs on mychart as soon as they post. I will later go in and write notes on them- will say "notes from Dr. Durene Cal"   Call us if you haven't heard within 2 weeks about carotid ultrasound   Recommended follow up: Return for next already scheduled visit or sooner if needed.

## 2023-08-13 NOTE — Progress Notes (Signed)
 Phone [phone] In person visit   Subjective:   Brianna Simon is a 73 y.o. year old very pleasant female patient who presents for/with See problem oriented charting Chief Complaint  Patient presents with   lab work    Here to discuss MMR.    Past Medical History-  Patient Active Problem List   Diagnosis Date Noted   History of CVA (cerebrovascular accident) 02/22/2010    Priority: High   Fatty liver 05/24/2020    Priority: Medium    Depression, major, single episode, complete remission (HCC) 11/14/2019    Priority: Medium    Hemiplegia affecting dominant side, 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HCC) 03/01/2018    Priority: Medium    Osteopenia 10/02/2017    Priority: Medium    Former smoker 12/09/2014    Priority: Medium    Anxiety state 10/27/2014    Priority: Medium    Hyperlipidemia 02/22/2010    Priority: Medium    Essential hypertension 12/07/2006    Priority: Medium    Allergic rhinitis 10/27/2014    Priority: Low   Esophageal reflux 12/31/2006    Priority: Low   Insomnia 12/31/2006    Priority: Low   MVP (mitral valve prolapse) 12/10/2006    Priority: Low   Alkaline phosphatase elevation 06/16/2019   Benign paroxysmal positional vertigo 11/13/2016   Low back pain 09/26/2016   Dysarthria 03/02/2015    Medications- reviewed and updated Current Outpatient Medications  Medication Sig Dispense Refill   azelastine (ASTELIN) 0.1 % nasal spray daily.  5   BIOTIN PO Take by mouth.     CALCIUM PO Take by mouth.     cholecalciferol (VITAMIN D) 1000 units tablet Take 2,000 Units by mouth daily.      clopidogrel (PLAVIX) 75 MG tablet TAKE 1 TABLET BY MOUTH EVERY DAY 90 tablet 3   desloratadine (CLARINEX) 5 MG tablet Take 5 mg by mouth daily.     desvenlafaxine (PRISTIQ) 100 MG 24 hr tablet TAKE 1 TABLET BY MOUTH EVERY DAY 90 tablet 4   famotidine (PEPCID) 10 MG tablet Take 10 mg by mouth as needed for heartburn or indigestion.     hydrochlorothiazide (HYDRODIURIL) 25 MG  tablet TAKE 1/2 TABLET BY MOUTH DAILY 45 tablet 3   ipratropium (ATROVENT) 0.06 % nasal spray INSTILL 2 SPRAYS IN EACH NOSTRIL 3 TIMES DAILY AS NEEDED  5   Melatonin 1 MG TABS Take 5 mg by mouth daily.     Multiple Vitamin (MULTIVITAMIN) tablet Take 1 tablet by mouth daily.     pantoprazole (PROTONIX) 20 MG tablet TAKE 1 TABLET BY MOUTH EVERY DAY 90 tablet 3   potassium chloride (KLOR-CON M10) 10 MEQ tablet Take 1 tablet 5 days a week. 65 tablet 3   rosuvastatin (CRESTOR) 40 MG tablet TAKE 1 TABLET BY MOUTH EVERY DAY 90 tablet 3   buPROPion (WELLBUTRIN XL) 150 MG 24 hr tablet Take 1 tablet (150 mg total) by mouth daily. 90 tablet 3   No current facility-administered medications for this visit.     Objective:  BP 110/68   Pulse 81   Ht 5\' 2"  (1.575 m)   Wt 147 lb 12.8 oz (67 kg)   LMP  (LMP Unknown)   SpO2 97%   BMI 27.03 kg/m  Gen: NAD, resting comfortably CV: RRR no murmurs rubs or gallops Lungs: CTAB no crackles, wheeze, rhonchi Abdomen: soft/nontender/nondistended/normal bowel sounds. No rebound or guarding.  Ext: minimal edema but noted varicose veins Skin: warm, dry  Assessment and Plan   # viral illness S:cold starting 2 weeks ago on sunday . Was improving then last Wednesday then started to have a fever last Wednesday. Diarrhea on Friday then resolved. Yesterday was finally fever free. Yesterday had a stool that was lighter colored. Has lost weight but has been very intentional . No fever in over 24 hours and no diarrhea in 72 hours. No home flu or COVID tests.  -feels like improving today A/P: seems to be improving - she will monitor at home and let me know if any worsening symptoms.    #History of CVA with residual right hemiplegia (numbness mainly right hand with some trouble picking things up as a result- no weakness reported) with loop recorder in place showing no evidence of atrial fibrillation #hyperlipidemia S: Medication: rosuvastatin 40 mg, Plavix 75 mg. No  aspirin Lab Results  Component Value Date   CHOL 119 07/18/2022   HDL 43.10 07/18/2022   LDLCALC 42 07/18/2022   LDLDIRECT 75 11/14/2019   TRIG 172.0 (H) 07/18/2022   CHOLHDL 3 07/18/2022   A/P: strokes history noted - right mild  hemiplegia- continue statin and plavix For hld- LDL goal under 70- was at goal last year- update today -rior carotid duplex in 1-39% range in 2020- shed like to update this year and this was ordered  # Elevated alkaline phosphatase-has seen endocrinology in the past but levels improved-check today with labs but nonfasting may be slightly higher  #hypertension S: medication: hctz 25 mg - takes half tablet daily with potassium 5 days a week BP Readings from Last 3 Encounters:  08/13/23 110/68  06/25/23 100/62  06/04/23 (!) 114/50  A/P: well controlled continue current medications . Update CMP today including potassium as slightly low on last check - may need to just go to daily  # Depression/Anxiety S: Medication: pristiq 50 mg--> 100mg , Wellbutrin added on 01/18/2023-up to 300 mg  - on higher dose felt like occasionally had changes in vision like carpet moving sesnation    06/25/2023    3:16 PM 06/04/2023    4:12 PM 06/04/2023    3:22 PM  Depression screen PHQ 2/9  Decreased Interest 0 0 0  Down, Depressed, Hopeless 0 0 0  PHQ - 2 Score 0 0 0  Altered sleeping 0 1   Tired, decreased energy 0 1   Change in appetite 0 0   Feeling bad or failure about yourself  0 0   Trouble concentrating 0 1   Moving slowly or fidgety/restless 0 0   Suicidal thoughts 0 0   PHQ-9 Score 0 3   Difficult doing work/chores Not difficult at all Not difficult at all   A/P: depression has been well controlled - wants to reduce Wellbutrin to 150 mg but did recommend seeing eye doctor to be on safe side with reported vision changes on medicine  Recommended follow up: Return for next already scheduled visit or sooner if needed. Future Appointments  Date Time Provider Department  Center  12/03/2023  3:00 PM Shelva Majestic, MD [facility]  06/30/2024  2:20 PM [facility] ANNUAL WELLNESS VISIT 1 [facility]    Lab/Order associations: NOT fasting   ICD-10-CM   1. Measles, mumps, rubella (MMR) vaccination status unknown  Z78.9 Measles/Mumps/Rubella Immunity    2. Essential hypertension  I10 Comprehensive metabolic panel with GFR    CBC with Differential/Platelet    Lipid panel    3. Hyperlipidemia, unspecified hyperlipidemia type  E78.5 Comprehensive metabolic  panel with GFR    CBC with Differential/Platelet    Lipid panel    4. Screening for diabetes mellitus  Z13.1 HgB A1c    5. Overweight  E66.3 HgB A1c    6. Bilateral carotid artery disease, unspecified type (HCC)  I77.9 VAS US CAROTID      Meds ordered this encounter  Medications   buPROPion (WELLBUTRIN XL) 150 MG 24 hr tablet    Sig: Take 1 tablet (150 mg total) by mouth daily.    Dispense:  90 tablet    Refill:  3    Return precautions advised.  Tana Conch, MD

## 2023-08-14 ENCOUNTER — Other Ambulatory Visit: Payer: Self-pay

## 2023-08-14 MED ORDER — POTASSIUM CHLORIDE CRYS ER 10 MEQ PO TBCR: 10.0000 meq | EXTENDED_RELEASE_TABLET | Freq: Every day | ORAL | 3 refills | Status: DC

## 2023-08-15 LAB — MEASLES/MUMPS/RUBELLA IMMUNITY
Mumps IgG: >300 [AU]/ml
Rubella: 11.7 {index}
Rubeola IgG: >300 [AU]/ml

## 2023-08-16 ENCOUNTER — Encounter: Payer: Self-pay | Admitting: Family Medicine

## 2023-09-04 ENCOUNTER — Ambulatory Visit (HOSPITAL_COMMUNITY)
Admission: RE | Admit: 2023-09-04 | Discharge: 2023-09-04 | Disposition: A | Discharge status: Home / Self Care | Source: Ambulatory Visit | Attending: Cardiology | Admitting: Cardiology

## 2023-09-04 DIAGNOSIS — I779 Disorder of arteries and arterioles, unspecified: Secondary | ICD-10-CM | POA: Diagnosis not present

## 2023-09-04 DIAGNOSIS — Z8673 Personal history of transient ischemic attack (TIA), and cerebral infarction without residual deficits: Secondary | ICD-10-CM | POA: Diagnosis not present

## 2023-10-16 ENCOUNTER — Encounter: Payer: Self-pay | Admitting: Family Medicine

## 2023-10-16 ENCOUNTER — Ambulatory Visit (INDEPENDENT_AMBULATORY_CARE_PROVIDER_SITE_OTHER): Admitting: Family Medicine

## 2023-10-16 VITALS — BP 120/80 | HR 67 | Temp 97.9°F | Ht 62.0 in | Wt 147.0 lb

## 2023-10-16 DIAGNOSIS — M25511 Pain in right shoulder: Secondary | ICD-10-CM

## 2023-10-16 DIAGNOSIS — I10 Essential (primary) hypertension: Secondary | ICD-10-CM | POA: Diagnosis not present

## 2023-10-16 NOTE — Patient Instructions (Addendum)
 Signs of impingement- bursitis vs rotator cuff tendonitis vs biceps tendonitis =refer to physical therapy- schedule this before you leave  We discussed trial prednisone  but wouldn't be ideal for weight gain, osteoporosis or for blood pressure - let me know if you change your mind  We can refer to orthopedic or sports medicine with [facility] if not improving  For RSV shot- you have my permission for you and husband to get this at your pharmacy- any of the 3 versions   Recommended follow up: Return for next already scheduled visit or sooner if needed.

## 2023-10-16 NOTE — Progress Notes (Signed)
 Phone [phone] In person visit   Subjective:   Brianna Simon is a 73 y.o. year old very pleasant female patient who presents for/with See problem oriented charting Chief Complaint  Patient presents with   Shoulder Pain    R shoulder; hurting in a different place this time; believe its rotator cuff; x1wk; want to discuss physical therapy again; haven't tried any treatment options    Past Medical History-  Patient Active Problem List   Diagnosis Date Noted   History of CVA (cerebrovascular accident) 02/22/2010    Priority: High   Fatty liver 05/24/2020    Priority: Medium    Depression, major, single episode, complete remission (HCC) 11/14/2019    Priority: Medium    Hemiplegia affecting dominant side, 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HCC) 03/01/2018    Priority: Medium    Osteopenia 10/02/2017    Priority: Medium    Former smoker 12/09/2014    Priority: Medium    Anxiety state 10/27/2014    Priority: Medium    Hyperlipidemia 02/22/2010    Priority: Medium    Essential hypertension 12/07/2006    Priority: Medium    Allergic rhinitis 10/27/2014    Priority: Low   Esophageal reflux 12/31/2006    Priority: Low   Insomnia 12/31/2006    Priority: Low   MVP (mitral valve prolapse) 12/10/2006    Priority: Low   Alkaline phosphatase elevation 06/16/2019   Benign paroxysmal positional vertigo 11/13/2016   Low back pain 09/26/2016   Dysarthria 03/02/2015    Medications- reviewed and updated Current Outpatient Medications  Medication Sig Dispense Refill   azelastine (ASTELIN) 0.1 % nasal spray daily.  5   BIOTIN PO Take by mouth.     buPROPion  (WELLBUTRIN  XL) 150 MG 24 hr tablet Take 1 tablet (150 mg total) by mouth daily. 90 tablet 3   CALCIUM  PO Take by mouth.     cholecalciferol (VITAMIN D ) 1000 units tablet Take 2,000 Units by mouth daily.      clopidogrel  (PLAVIX ) 75 MG tablet TAKE 1 TABLET BY MOUTH EVERY DAY 90 tablet 3   desloratadine (CLARINEX) 5 MG tablet Take 5 mg by  mouth daily.     desvenlafaxine  (PRISTIQ ) 100 MG 24 hr tablet TAKE 1 TABLET BY MOUTH EVERY DAY 90 tablet 4   famotidine  (PEPCID ) 10 MG tablet Take 10 mg by mouth as needed for heartburn or indigestion.     hydrochlorothiazide  (HYDRODIURIL ) 25 MG tablet TAKE 1/2 TABLET BY MOUTH DAILY 45 tablet 3   ipratropium (ATROVENT) 0.06 % nasal spray INSTILL 2 SPRAYS IN EACH NOSTRIL 3 TIMES DAILY AS NEEDED  5   magnesium (MAGTAB) 84 MG ( ) TBCR SR tablet Take 84 mg by mouth.     Melatonin 1 MG TABS Take 5 mg by mouth daily.     Multiple Vitamin (MULTIVITAMIN) tablet Take 1 tablet by mouth daily.     pantoprazole  (PROTONIX ) 20 MG tablet TAKE 1 TABLET BY MOUTH EVERY DAY 90 tablet 3   potassium chloride  (KLOR-CON  M10) 10 MEQ tablet Take 1 tablet (10 mEq total) by mouth daily. 90 tablet 3   rosuvastatin  (CRESTOR ) 40 MG tablet TAKE 1 TABLET BY MOUTH EVERY DAY 90 tablet 3   No current facility-administered medications for this visit.     Objective:  BP 120/80   Pulse 67   Temp 97.9 F (36.6 C)   Ht 5\' 2"  (1.575 m)   Wt 147 lb (66.7 kg)   LMP  (LMP Unknown)   SpO2 96%  BMI 26.89 kg/m  Gen: NAD, resting comfortably CV: RRR no murmurs rubs or gallops Lungs: CTAB no crackles, wheeze, rhonchi  Ext: no edema Skin: warm, dry  Left shoulder exam normal  Right shoulder exam  Inspection reveals no abnormalities, atrophy or asymmetry. Palpation is normal with no tenderness over AC joint BUT does have pain over bicipital groove. ROM is restricted by pain particularly in abduction Rotator cuff strength normal throughout other than limited by pain. Positive signs of impingement including Neer and Hawkin's tests, empty can. No painful arc and no drop arm sign.    Assessment and Plan   # Right shoulder pain S:years ago had physical therapy for the shoulder- was helpful at that time- before hurt into the upper arm- now it grabs her if reaches out on anterior portion of shoulder - started about a week  aog. No fall or injury. Limited in options with stroke history has to avoid NSAIDs. No tylenol  or heating peripheral arterial disease. Doesn't ache like it did prevoiusly but gets tremendous jolts of pain- can be severe such as when shaking laundry -she's concerned about her rotator cuff.  -no chest pain or shortness of breath with it. -has been working out at the gym but has had to pull back due to this pain  A/P: Patient with signs of impingement on exam with positive empty can, Hawkins, Neer test.  Also has pain over bicipital groove.  Concern for bursitis versus rotator cuff tendinitis versus biceps tendinitis.  Offered course of prednisone  but she is concerned about side effects including effects on bone density, weight gain, blood pressure and wants to opt out for now.  She is interested in trial with physical therapy which I think is reasonable and if fails to improve with this is open to referral to sports medicine orthopedics -We need to avoid NSAIDs with show history  #hypertension S: medication: hctz 25 mg - takes half tablet daily with potassium 5 days a week A/P: Well-controlled-continue current medication  Recommended follow up: Return for next already scheduled visit or sooner if needed. Future Appointments  Date Time Provider Department Center  12/03/2023  3:00 PM Almira Jaeger, MD [facility]  06/30/2024  2:20 PM [facility] ANNUAL WELLNESS VISIT 1 [facility]    Lab/Order associations:   ICD-10-CM   1. Acute pain of right shoulder  M25.511 Ambulatory referral to Physical Therapy    2. Essential hypertension  I10       No orders of the defined types were placed in this encounter.   Return precautions advised.  Clarisa Crooked, MD

## 2023-10-17 ENCOUNTER — Telehealth: Payer: Self-pay

## 2023-10-17 DIAGNOSIS — Z2911 Encounter for prophylactic immunotherapy for respiratory syncytial virus (RSV): Secondary | ICD-10-CM

## 2023-10-17 MED ORDER — AREXVY 120 MCG/0.5ML IM SUSR: 0.5000 mL | Freq: Once | INTRAMUSCULAR | 0 refills | Status: AC

## 2023-10-17 NOTE — Telephone Encounter (Signed)
 Can send RSV vaccine- jus tneed to know which one that pharmacy has

## 2023-10-17 NOTE — Telephone Encounter (Signed)
 Called and spoke with Mia at [facility] and they have Arexvy, rx has been submitted.

## 2023-10-22 ENCOUNTER — Other Ambulatory Visit: Payer: Self-pay | Admitting: Family Medicine

## 2023-11-12 ENCOUNTER — Telehealth: Payer: Self-pay

## 2023-11-12 NOTE — Telephone Encounter (Signed)
 Immunizations placed up front for pt to pick up, called and made pt aware.  Copied from CRM [id number]. Topic: Medical Record Request - Records Request >> Nov 12, 2023 12:24 PM [place] wrote: Reason for CRM: Patient wanting to get a copy of immunization records sent. Please reach out to patient when available.

## 2023-11-20 ENCOUNTER — Ambulatory Visit: Admitting: Physical Therapy

## 2023-11-20 DIAGNOSIS — M25511 Pain in right shoulder: Secondary | ICD-10-CM | POA: Diagnosis not present

## 2023-11-20 NOTE — Therapy (Signed)
 OUTPATIENT PHYSICAL THERAPY UPPER EXTREMITY EVALUATION   Patient Name: Brianna Simon MRN: [id number] DOB:[birth year], 73 y.o., female Today's Date: 11/20/2023  END OF SESSION:  PT End of Session - 11/22/23 1613     Visit Number 1    Number of Visits 16    Date for PT Re-Evaluation 01/15/24    Authorization Type Medicare    PT Start Time 1518    PT Stop Time 1558    PT Time Calculation (min) 40 min    Activity Tolerance Patient tolerated treatment well    Behavior During Therapy WFL for tasks assessed/performed          Past Medical History:  Diagnosis Date   Anxiety    Arthritis    hips, hands, lower back - otc med prn   Cancer (HCC)    skin cancer left wrist and left chest   GERD (gastroesophageal reflux disease)    Hx: UTI (urinary tract infection)    Hyperlipidemia    Hypertension    Mitral valve prolapse    does not cause patient any problems   Seasonal allergies    Stroke (HCC) 2011   mild   SVD (spontaneous vaginal delivery)    x 1 - gave up for adoption   Past Surgical History:  Procedure Laterality Date   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    cataract surgery     july 2024 and aug 2023   COLONOSCOPY     DILATATION & CURETTAGE/HYSTEROSCOPY WITH TRUECLEAR N/A 10/24/2013   Procedure: DILATATION & CURETTAGE/HYSTEROSCOPY WITH TRUCLEAR;  Surgeon: Nathanel LELON Bunker, MD;  Location: [facility];  Service: Gynecology;  Laterality: N/A;  1 1/2hrs OR time   EP IMPLANTABLE DEVICE N/A 03/02/2015   Procedure: Loop Recorder Insertion;  Surgeon: Will Gladis Norton, MD;  Location: [facility];  Service: Cardiovascular;  Laterality: N/A;   lipoma removed     right shoulder   SINUS SURGERY WITH INSTATRAK     WISDOM TOOTH EXTRACTION     Patient Active Problem List   Diagnosis Date Noted   Fatty liver 05/24/2020   Depression, major, single episode, complete remission (HCC) 11/14/2019   Alkaline phosphatase elevation 06/16/2019    Hemiplegia affecting dominant side, 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HCC) 03/01/2018   Osteopenia 10/02/2017   Benign paroxysmal positional vertigo 11/13/2016   Low back pain 09/26/2016   Dysarthria 03/02/2015   Former smoker 12/09/2014   Allergic rhinitis 10/27/2014   Anxiety state 10/27/2014   Hyperlipidemia 02/22/2010   History of CVA (cerebrovascular accident) 02/22/2010   Esophageal reflux 12/31/2006   Insomnia 12/31/2006   MVP (mitral valve prolapse) 12/10/2006   Essential hypertension 12/07/2006    PCP: Garnette Lukes  REFERRING PROVIDER: Garnette Lukes  REFERRING DIAG: acute pain of R shoulder  THERAPY DIAG:  Acute pain of right shoulder  Rationale for Evaluation and Treatment: Rehabilitation  ONSET DATE:   SUBJECTIVE:  SUBJECTIVE STATEMENT: Pt states new onset of pain in R shoulder , has been going to gym working out on equipment, thinks pain may have started with this. She notes Previous pain 12 years ago, which resolved with help from PT. Notes increased pain with Reaching out to side, or back, in back seat.  Can do hair, but reaching up into cabinet is sore.   Hand dominance: Right  PERTINENT HISTORY: CVA (2) - few problems with speech and R hand numb, Skin CA   PAIN:  Are you having pain? Yes: NPRS scale: up to 5/10 Pain location: R shoulder/anterior  Pain description: sore Aggravating factors: reaching out, back.  Relieving factors: rest   PRECAUTIONS: None  RED FLAGS: None   WEIGHT BEARING RESTRICTIONS: No  FALLS:  Has patient fallen in last 6 months? No   PLOF: Independent  PATIENT GOALS: Decreased pain in shoulder.   NEXT MD VISIT:   OBJECTIVE:   DIAGNOSTIC FINDINGS:   PATIENT SURVEYS :    COGNITION: Overall cognitive status: Within functional limits for tasks  assessed     SENSATION: WFL  POSTURE:   UPPER EXTREMITY ROM:   Active  ROM Right eval Left eval  Shoulder flexion 115 140  Shoulder extension    Shoulder abduction 110   Shoulder adduction    Shoulder internal rotation wfl   Shoulder external rotation wfl   Elbow flexion    Elbow extension    Wrist flexion    Wrist extension    Wrist ulnar deviation    Wrist radial deviation    Wrist pronation    Wrist supination    (Blank rows = not tested)  UPPER EXTREMITY MMT:  MMT Right eval Left eval  Shoulder flexion 4-/pain 4+  Shoulder extension    Shoulder abduction 4- 4+  Shoulder adduction    Shoulder internal rotation 4+ 4+  Shoulder external rotation 4 4  Middle trapezius    Lower trapezius    Elbow flexion    Elbow extension    Wrist flexion    Wrist extension    Wrist ulnar deviation    Wrist radial deviation    Wrist pronation    Wrist supination    Grip strength (lbs)    (Blank rows = not tested)  SHOULDER SPECIAL TESTS:   JOINT MOBILITY TESTING:  Mild hypomobility- mostly for flexion   PALPATION:  Tenderness in anterior shoulder, bicep groove, and tenderness into pec.     TODAY'S TREATMENT:                                                                                                                                         DATE:  Eval: ther ex: See below for HEP  PATIENT EDUCATION:  Education details: PT POC, Exam findings, HEP Person educated: Patient Education method: Explanation, Demonstration, Tactile cues, Verbal cues, and Handouts Education comprehension: verbalized understanding,  returned demonstration, verbal cues required, tactile cues required, and needs further education   HOME EXERCISE PROGRAM: Access Code: [email or web address] URL: [email or web address] Date: 11/22/2023 Prepared by: Tinnie Don  Exercises - Supine Shoulder Flexion Extension AAROM with Dowel  - 1-2 x daily - 1 sets - 10 reps - Supine Pectoralis  Stretch  - 1 x daily - 3 reps - 20-30 hold - Standing Scapular Retraction  - 1-2 x daily - 1 sets - 10 reps  ASSESSMENT:  CLINICAL IMPRESSION: Eval:    Patient presents with primary complaint of pain in R shoulder. She has stiffness in GHJ and rom deficits, and increased pain with movement.She has decreased strength as well. She has lack of effective HEP for her ongoing pain. Pt with decreased ability for full functional activities, reaching, lifting, carrying, and IADLs. Pt to benefit from skilled PT to improve deficits and pain.    OBJECTIVE IMPAIRMENTS: decreased activity tolerance, decreased mobility, decreased ROM, decreased strength, hypomobility, increased muscle spasms, impaired UE functional use, and pain.   ACTIVITY LIMITATIONS: carrying, lifting, reach over head, hygiene/grooming, and locomotion level  PARTICIPATION LIMITATIONS: meal prep, cleaning, laundry, driving, shopping, and community activity  PERSONAL FACTORS: none are also affecting patient's functional outcome.   REHAB POTENTIAL: Good  CLINICAL DECISION MAKING: Stable/uncomplicated  EVALUATION COMPLEXITY: Low  GOALS: Goals reviewed with patient? Yes   SHORT TERM GOALS: Target date: 12/11/23  Pt to be intendment with initial HEP  Goal status: INITIAL  2.  Pt to demo improved shoulder arom by at least 10 deg for elevation.   Goal status: INITIAL    LONG TERM GOALS: Target date: 01/15/24  Pt to be independent with final HEP  Goal status: INITIAL  2.  Pt to demo improved R shoulder  AROM to be [facility] and pain free, to improve ability for ADLs.   Goal status: INITIAL  3.  Pt to demo improved R shoulder strength to be at least 4/5, to improve ability for reach, lift, and IADLS.   Goal status: INITIAL  4.  Pt to report decreased pain in R shoulder to 0-2/10 with activity, adls and iadls.   Goal status: INITIAL    PLAN: PT FREQUENCY: 1-2x/week  PT DURATION: 8 weeks  PLANNED INTERVENTIONS:  Therapeutic exercises, Therapeutic activity, Neuromuscular re-education, Patient/Family education, Self Care, Joint mobilization, Joint manipulation, Stair training, DME instructions, Aquatic Therapy, Dry Needling, Electrical stimulation, Cryotherapy, Moist heat, Taping, Ultrasound, Ionotophoresis 4mg /ml Dexamethasone , Manual therapy,  Vasopneumatic device, Traction, Spinal manipulation, Spinal mobilization,    PLAN FOR NEXT SESSION:   Tinnie Don, PT, DPT 4:18 PM  11/22/23

## 2023-11-22 ENCOUNTER — Encounter: Payer: Self-pay | Admitting: Physical Therapy

## 2023-11-27 ENCOUNTER — Other Ambulatory Visit: Payer: Self-pay | Admitting: Family Medicine

## 2023-11-27 DIAGNOSIS — I639 Cerebral infarction, unspecified: Secondary | ICD-10-CM

## 2023-12-03 ENCOUNTER — Ambulatory Visit: Payer: Medicare Other | Admitting: Family Medicine

## 2023-12-10 ENCOUNTER — Ambulatory Visit (INDEPENDENT_AMBULATORY_CARE_PROVIDER_SITE_OTHER): Admitting: Physical Therapy

## 2023-12-10 ENCOUNTER — Encounter: Payer: Self-pay | Admitting: Physical Therapy

## 2023-12-10 DIAGNOSIS — M25511 Pain in right shoulder: Secondary | ICD-10-CM | POA: Diagnosis not present

## 2023-12-10 NOTE — Therapy (Signed)
 OUTPATIENT PHYSICAL THERAPY UPPER EXTREMITY TREATMENT    Patient Name: Brianna Simon MRN: [id number] DOB:July 01, 1950, 73 y.o., female Today's Date: 12/10/2023  END OF SESSION:  PT End of Session - 12/10/23 1506     Visit Number 2    Number of Visits 16    Date for PT Re-Evaluation 01/15/24    Authorization Type Medicare    PT Start Time 1510    PT Stop Time 1550    PT Time Calculation (min) 40 min    Activity Tolerance Patient tolerated treatment well    Behavior During Therapy WFL for tasks assessed/performed          Past Medical History:  Diagnosis Date   Anxiety    Arthritis    hips, hands, lower back - otc med prn   Cancer (HCC)    skin cancer left wrist and left chest   GERD (gastroesophageal reflux disease)    Hx: UTI (urinary tract infection)    Hyperlipidemia    Hypertension    Mitral valve prolapse    does not cause patient any problems   Seasonal allergies    Stroke (HCC) 2011   mild   SVD (spontaneous vaginal delivery)    x 1 - gave up for adoption   Past Surgical History:  Procedure Laterality Date   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    cataract surgery     july 2024 and aug 2023   COLONOSCOPY     DILATATION & CURETTAGE/HYSTEROSCOPY WITH TRUECLEAR N/A 10/24/2013   Procedure: DILATATION & CURETTAGE/HYSTEROSCOPY WITH TRUCLEAR;  Surgeon: Nathanel LELON Bunker, MD;  Location: [facility];  Service: Gynecology;  Laterality: N/A;  1 1/2hrs OR time   EP IMPLANTABLE DEVICE N/A 03/02/2015   Procedure: Loop Recorder Insertion;  Surgeon: Will Gladis Norton, MD;  Location: [facility];  Service: Cardiovascular;  Laterality: N/A;   lipoma removed     right shoulder   SINUS SURGERY WITH INSTATRAK     WISDOM TOOTH EXTRACTION     Patient Active Problem List   Diagnosis Date Noted   Fatty liver 05/24/2020   Depression, major, single episode, complete remission (HCC) 11/14/2019   Alkaline phosphatase elevation 06/16/2019    Hemiplegia affecting dominant side, 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HCC) 03/01/2018   Osteopenia 10/02/2017   Benign paroxysmal positional vertigo 11/13/2016   Low back pain 09/26/2016   Dysarthria 03/02/2015   Former smoker 12/09/2014   Allergic rhinitis 10/27/2014   Anxiety state 10/27/2014   Hyperlipidemia 02/22/2010   History of CVA (cerebrovascular accident) 02/22/2010   Esophageal reflux 12/31/2006   Insomnia 12/31/2006   MVP (mitral valve prolapse) 12/10/2006   Essential hypertension 12/07/2006    PCP: Garnette Lukes  REFERRING PROVIDER: Garnette Lukes  REFERRING DIAG: acute pain of R shoulder  THERAPY DIAG:  Acute pain of right shoulder  Rationale for Evaluation and Treatment: Rehabilitation  ONSET DATE:   SUBJECTIVE:  SUBJECTIVE STATEMENT: Continued pain with reaching up and out. Still tender in front of shoulder. Has been doing HEP.   Eval: Pt states new onset of pain in R shoulder , has been going to gym working out on equipment, thinks pain may have started with this. She notes Previous pain 12 years ago, which resolved with help from PT. Notes increased pain with Reaching out to side, or back, in back seat.  Can do hair, but reaching up into cabinet is sore.   Hand dominance: Right  PERTINENT HISTORY: CVA (2) - few problems with speech and R hand numb, Skin CA   PAIN:  Are you having pain? Yes: NPRS scale: up to 5/10 Pain location: R shoulder/anterior  Pain description: sore Aggravating factors: reaching out, back.  Relieving factors: rest   PRECAUTIONS: None  RED FLAGS: None   WEIGHT BEARING RESTRICTIONS: No  FALLS:  Has patient fallen in last 6 months? No   PLOF: Independent  PATIENT GOALS: Decreased pain in shoulder.   NEXT MD VISIT:   OBJECTIVE:   DIAGNOSTIC FINDINGS:    PATIENT SURVEYS :    COGNITION: Overall cognitive status: Within functional limits for tasks assessed     SENSATION: WFL  POSTURE:   UPPER EXTREMITY ROM:   Active  ROM Right eval Left eval Right   Shoulder flexion 115 140 125  Shoulder extension     Shoulder abduction 110    Shoulder adduction     Shoulder internal rotation wfl    Shoulder external rotation wfl    Elbow flexion     Elbow extension     Wrist flexion     Wrist extension     Wrist ulnar deviation     Wrist radial deviation     Wrist pronation     Wrist supination     (Blank rows = not tested)  UPPER EXTREMITY MMT:  MMT Right eval Left eval  Shoulder flexion 4-/pain 4+  Shoulder extension    Shoulder abduction 4- 4+  Shoulder adduction    Shoulder internal rotation 4+ 4+  Shoulder external rotation 4 4  Middle trapezius    Lower trapezius    Elbow flexion    Elbow extension    Wrist flexion    Wrist extension    Wrist ulnar deviation    Wrist radial deviation    Wrist pronation    Wrist supination    Grip strength (lbs)    (Blank rows = not tested)  SHOULDER SPECIAL TESTS:   JOINT MOBILITY TESTING:  Mild hypomobility- mostly for flexion   PALPATION:  Tenderness in anterior shoulder, bicep groove, and tenderness into pec.     TODAY'S TREATMENT:                                                                                                                                         DATE:  12/10/2023 Therapeutic Exercise: Aerobic: Supine:  shoulder flexion/aarom/cane x 10;   pec stretch- arm on towel x 2 min;  prom R shoulder, all motions  S/L: shoulder ER 2 x 10- given for HEP Seated:  pulley x 15 flexion and abd;  Standing:  wall slides x 10 on R;  scap squeeze x 10;  Rows GTB x 15;  Bicep curls 3lb x 15 bil ( no pain)  Stretches:  Neuromuscular Re-education: Manual Therapy: STM bicep groove and pec  Therapeutic Activity: Self Care:  Eval: ther ex: See below for  HEP  PATIENT EDUCATION:  Education details: updated and reviewed HEP Person educated: Patient Education method: Explanation, Demonstration, Tactile cues, Verbal cues, and Handouts Education comprehension: verbalized understanding, returned demonstration, verbal cues required, tactile cues required, and needs further education   HOME EXERCISE PROGRAM: Access Code: [email or web address] URL: [email or web address] Date: 11/22/2023 Prepared by: Tinnie Don  Exercises - Supine Shoulder Flexion Extension AAROM with Dowel  - 1-2 x daily - 1 sets - 10 reps - Supine Pectoralis Stretch  - 1 x daily - 3 reps - 20-30 hold - Standing Scapular Retraction  - 1-2 x daily - 1 sets - 10 reps  ASSESSMENT:  CLINICAL IMPRESSION: 12/10/2023   Pt able to perform pain free strengthening today with rows and ER. She does continue to have soreness in bicep groove with palpation and with active elevation. Plan to progress as tolerated.    Eval: Patient presents with primary complaint of pain in R shoulder. She has stiffness in GHJ and rom deficits, and increased pain with movement.She has decreased strength as well. She has lack of effective HEP for her ongoing pain. Pt with decreased ability for full functional activities, reaching, lifting, carrying, and IADLs. Pt to benefit from skilled PT to improve deficits and pain.    OBJECTIVE IMPAIRMENTS: decreased activity tolerance, decreased mobility, decreased ROM, decreased strength, hypomobility, increased muscle spasms, impaired UE functional use, and pain.   ACTIVITY LIMITATIONS: carrying, lifting, reach over head, hygiene/grooming, and locomotion level  PARTICIPATION LIMITATIONS: meal prep, cleaning, laundry, driving, shopping, and community activity  PERSONAL FACTORS: none are also affecting patient's functional outcome.   REHAB POTENTIAL: Good  CLINICAL DECISION MAKING: Stable/uncomplicated  EVALUATION COMPLEXITY: Low  GOALS: Goals reviewed  with patient? Yes   SHORT TERM GOALS: Target date: 12/11/23  Pt to be intendment with initial HEP  Goal status: INITIAL  2.  Pt to demo improved shoulder arom by at least 10 deg for elevation.   Goal status: INITIAL    LONG TERM GOALS: Target date: 01/15/24  Pt to be independent with final HEP  Goal status: INITIAL  2.  Pt to demo improved R shoulder  AROM to be [facility] and pain free, to improve ability for ADLs.   Goal status: INITIAL  3.  Pt to demo improved R shoulder strength to be at least 4/5, to improve ability for reach, lift, and IADLS.   Goal status: INITIAL  4.  Pt to report decreased pain in R shoulder to 0-2/10 with activity, adls and iadls.   Goal status: INITIAL    PLAN: PT FREQUENCY: 1-2x/week  PT DURATION: 8 weeks  PLANNED INTERVENTIONS: Therapeutic exercises, Therapeutic activity, Neuromuscular re-education, Patient/Family education, Self Care, Joint mobilization, Joint manipulation, Stair training, DME instructions, Aquatic Therapy, Dry Needling, Electrical stimulation, Cryotherapy, Moist heat, Taping, Ultrasound, Ionotophoresis 4mg /ml Dexamethasone , Manual therapy,  Vasopneumatic device, Traction, Spinal manipulation, Spinal mobilization,    PLAN FOR NEXT SESSION:   Tinnie Don,  PT, DPT 3:07 PM  12/10/23

## 2023-12-14 ENCOUNTER — Ambulatory Visit: Admitting: Physical Therapy

## 2023-12-14 ENCOUNTER — Encounter: Payer: Self-pay | Admitting: Physical Therapy

## 2023-12-14 DIAGNOSIS — M25511 Pain in right shoulder: Secondary | ICD-10-CM | POA: Diagnosis not present

## 2023-12-14 NOTE — Therapy (Signed)
 OUTPATIENT PHYSICAL THERAPY UPPER EXTREMITY TREATMENT    Patient Name: Brianna Simon MRN: [id number] DOB:12-Apr-1951, 73 y.o., female Today's Date: /12/14/2023    END OF SESSION:  PT End of Session - 12/14/23 1103     Visit Number 3    Number of Visits 16    Date for PT Re-Evaluation 01/15/24    Authorization Type Medicare    PT Start Time 1105    PT Stop Time 1145    PT Time Calculation (min) 40 min    Activity Tolerance Patient tolerated treatment well    Behavior During Therapy WFL for tasks assessed/performed          Past Medical History:  Diagnosis Date   Anxiety    Arthritis    hips, hands, lower back - otc med prn   Cancer (HCC)    skin cancer left wrist and left chest   GERD (gastroesophageal reflux disease)    Hx: UTI (urinary tract infection)    Hyperlipidemia    Hypertension    Mitral valve prolapse    does not cause patient any problems   Seasonal allergies    Stroke (HCC) 2011   mild   SVD (spontaneous vaginal delivery)    x 1 - gave up for adoption   Past Surgical History:  Procedure Laterality Date   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    cataract surgery     july 2024 and aug 2023   COLONOSCOPY     DILATATION & CURETTAGE/HYSTEROSCOPY WITH TRUECLEAR N/A 10/24/2013   Procedure: DILATATION & CURETTAGE/HYSTEROSCOPY WITH TRUCLEAR;  Surgeon: Nathanel LELON Bunker, MD;  Location: [facility];  Service: Gynecology;  Laterality: N/A;  1 1/2hrs OR time   EP IMPLANTABLE DEVICE N/A 03/02/2015   Procedure: Loop Recorder Insertion;  Surgeon: Will Gladis Norton, MD;  Location: [facility];  Service: Cardiovascular;  Laterality: N/A;   lipoma removed     right shoulder   SINUS SURGERY WITH INSTATRAK     WISDOM TOOTH EXTRACTION     Patient Active Problem List   Diagnosis Date Noted   Fatty liver 05/24/2020   Depression, major, single episode, complete remission (HCC) 11/14/2019   Alkaline phosphatase elevation 06/16/2019    Hemiplegia affecting dominant side, 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HCC) 03/01/2018   Osteopenia 10/02/2017   Benign paroxysmal positional vertigo 11/13/2016   Low back pain 09/26/2016   Dysarthria 03/02/2015   Former smoker 12/09/2014   Allergic rhinitis 10/27/2014   Anxiety state 10/27/2014   Hyperlipidemia 02/22/2010   History of CVA (cerebrovascular accident) 02/22/2010   Esophageal reflux 12/31/2006   Insomnia 12/31/2006   MVP (mitral valve prolapse) 12/10/2006   Essential hypertension 12/07/2006    PCP: Garnette Lukes  REFERRING PROVIDER: Garnette Lukes  REFERRING DIAG: acute pain of R shoulder  THERAPY DIAG:  Acute pain of right shoulder  Rationale for Evaluation and Treatment: Rehabilitation  ONSET DATE:   SUBJECTIVE:  SUBJECTIVE STATEMENT: PT states mild improvements. Still tender in front of shoulder. Has been doing HEP.   Eval: Pt states new onset of pain in R shoulder , has been going to gym working out on equipment, thinks pain may have started with this. She notes Previous pain 12 years ago, which resolved with help from PT. Notes increased pain with Reaching out to side, or back, in back seat.  Can do hair, but reaching up into cabinet is sore.   Hand dominance: Right  PERTINENT HISTORY: CVA (2) - few problems with speech and R hand numb, Skin CA   PAIN:  Are you having pain? Yes: NPRS scale: up to 5/10 Pain location: R shoulder/anterior  Pain description: sore Aggravating factors: reaching out, back.  Relieving factors: rest   PRECAUTIONS: None  RED FLAGS: None   WEIGHT BEARING RESTRICTIONS: No  FALLS:  Has patient fallen in last 6 months? No   PLOF: Independent  PATIENT GOALS: Decreased pain in shoulder.   NEXT MD VISIT:   OBJECTIVE:   DIAGNOSTIC FINDINGS:   PATIENT  SURVEYS :    COGNITION: Overall cognitive status: Within functional limits for tasks assessed     SENSATION: WFL  POSTURE:   UPPER EXTREMITY ROM:   Active  ROM Right eval Left eval Right    Shoulder flexion 115 140 125   Shoulder extension      Shoulder abduction 110     Shoulder adduction      Shoulder internal rotation wfl     Shoulder external rotation wfl     Elbow flexion      Elbow extension      Wrist flexion      Wrist extension      Wrist ulnar deviation      Wrist radial deviation      Wrist pronation      Wrist supination      (Blank rows = not tested)  UPPER EXTREMITY MMT:  MMT Right eval Left eval  Shoulder flexion 4-/pain 4+  Shoulder extension    Shoulder abduction 4- 4+  Shoulder adduction    Shoulder internal rotation 4+ 4+  Shoulder external rotation 4 4  Middle trapezius    Lower trapezius    Elbow flexion    Elbow extension    Wrist flexion    Wrist extension    Wrist ulnar deviation    Wrist radial deviation    Wrist pronation    Wrist supination    Grip strength (lbs)    (Blank rows = not tested)  SHOULDER SPECIAL TESTS:   JOINT MOBILITY TESTING:  Mild hypomobility- mostly for flexion   PALPATION:  Tenderness in anterior shoulder, bicep groove, and tenderness into pec.     TODAY'S TREATMENT:  DATE:   12/14/2023 Therapeutic Exercise: Aerobic: Supine:  shoulder flexion/aarom/cane x 10;   pec stretch- arm on towel x 2 min;  prom R shoulder, all motions  S/L: shoulder ER 2 x 10  Seated:  pulley x 15 flexion  Standing:  wall slides x 10 on R;   scap squeeze x 10;   Rows GTB x 15;   Bicep curls 3lb x 15 bil ( no pain) ;   Shoulder ER YTB 2 x 10; arom/abd to 90 deg x 12 Stretches:  Neuromuscular Re-education: Manual Therapy:  Therapeutic Activity: Self Care:   Eval: ther ex: See below for  HEP  PATIENT EDUCATION:  Education details: updated and reviewed HEP Person educated: Patient Education method: Explanation, Demonstration, Tactile cues, Verbal cues, and Handouts Education comprehension: verbalized understanding, returned demonstration, verbal cues required, tactile cues required, and needs further education   HOME EXERCISE PROGRAM: Access Code: [email or web address] URL: [email or web address] Date: 11/22/2023 Prepared by: Tinnie Don  Exercises - Supine Shoulder Flexion Extension AAROM with Dowel  - 1-2 x daily - 1 sets - 10 reps - Supine Pectoralis Stretch  - 1 x daily - 3 reps - 20-30 hold - Standing Scapular Retraction  - 1-2 x daily - 1 sets - 10 reps  ASSESSMENT:  CLINICAL IMPRESSION: 12/14/2023  Pt with slight improvement of flexion rom. Still having soreness in front of shoulder. No increased pain with ther ex for strengthening today. Will continue scapular and ER strength and pain relief for anterior shoulder.    Eval: Patient presents with primary complaint of pain in R shoulder. She has stiffness in GHJ and rom deficits, and increased pain with movement.She has decreased strength as well. She has lack of effective HEP for her ongoing pain. Pt with decreased ability for full functional activities, reaching, lifting, carrying, and IADLs. Pt to benefit from skilled PT to improve deficits and pain.    OBJECTIVE IMPAIRMENTS: decreased activity tolerance, decreased mobility, decreased ROM, decreased strength, hypomobility, increased muscle spasms, impaired UE functional use, and pain.   ACTIVITY LIMITATIONS: carrying, lifting, reach over head, hygiene/grooming, and locomotion level  PARTICIPATION LIMITATIONS: meal prep, cleaning, laundry, driving, shopping, and community activity  PERSONAL FACTORS: none are also affecting patient's functional outcome.   REHAB POTENTIAL: Good  CLINICAL DECISION MAKING: Stable/uncomplicated  EVALUATION COMPLEXITY:  Low  GOALS: Goals reviewed with patient? Yes   SHORT TERM GOALS: Target date: 12/11/23  Pt to be intendment with initial HEP  Goal status: INITIAL  2.  Pt to demo improved shoulder arom by at least 10 deg for elevation.   Goal status: INITIAL    LONG TERM GOALS: Target date: 01/15/24  Pt to be independent with final HEP  Goal status: INITIAL  2.  Pt to demo improved R shoulder  AROM to be [facility] and pain free, to improve ability for ADLs.   Goal status: INITIAL  3.  Pt to demo improved R shoulder strength to be at least 4/5, to improve ability for reach, lift, and IADLS.   Goal status: INITIAL  4.  Pt to report decreased pain in R shoulder to 0-2/10 with activity, adls and iadls.   Goal status: INITIAL    PLAN: PT FREQUENCY: 1-2x/week  PT DURATION: 8 weeks  PLANNED INTERVENTIONS: Therapeutic exercises, Therapeutic activity, Neuromuscular re-education, Patient/Family education, Self Care, Joint mobilization, Joint manipulation, Stair training, DME instructions, Aquatic Therapy, Dry Needling, Electrical stimulation, Cryotherapy, Moist heat, Taping, Ultrasound, Ionotophoresis 4mg /ml Dexamethasone , Manual therapy,  Vasopneumatic device, Traction, Spinal  manipulation, Spinal mobilization,    PLAN FOR NEXT SESSION:   Tinnie Don, PT, DPT 11:26 AM  12/14/23

## 2023-12-18 ENCOUNTER — Ambulatory Visit: Admitting: Family Medicine

## 2023-12-18 ENCOUNTER — Encounter: Payer: Self-pay | Admitting: Family Medicine

## 2023-12-18 VITALS — BP 100/64 | HR 83 | Temp 97.5°F | Ht 62.0 in | Wt 148.0 lb

## 2023-12-18 DIAGNOSIS — I69359 Hemiplegia and hemiparesis following cerebral infarction affecting unspecified side: Secondary | ICD-10-CM | POA: Diagnosis not present

## 2023-12-18 DIAGNOSIS — I10 Essential (primary) hypertension: Secondary | ICD-10-CM | POA: Diagnosis not present

## 2023-12-18 DIAGNOSIS — F325 Major depressive disorder, single episode, in full remission: Secondary | ICD-10-CM

## 2023-12-18 DIAGNOSIS — E785 Hyperlipidemia, unspecified: Secondary | ICD-10-CM

## 2023-12-18 MED ORDER — IPRATROPIUM BROMIDE 0.06 % NA SOLN: 2.0000 | Freq: Three times a day (TID) | NASAL | 3 refills | Status: AC

## 2023-12-18 MED ORDER — DESLORATADINE 5 MG PO TABS: 5.0000 mg | ORAL_TABLET | Freq: Every day | ORAL | 3 refills | Status: AC

## 2023-12-18 MED ORDER — AZELASTINE HCL 0.1 % NA SOLN: 1.0000 | Freq: Every day | NASAL | 3 refills | Status: AC

## 2023-12-18 MED ORDER — POTASSIUM CHLORIDE ER 10 MEQ PO CPCR: 10.0000 meq | ORAL_CAPSULE | Freq: Every day | ORAL | 3 refills | Status: AC

## 2023-12-18 NOTE — Patient Instructions (Addendum)
 We definitely need labs next visit- lets try the capsul for potassium to see if that does better- that was my main interest for checking labs today as other #s looked pretty good  Glad you are doing reasonably well!   Recommended follow up: Return in about 6 months (around 06/19/2024) for followup or sooner if needed.Schedule b4 you leave.

## 2023-12-18 NOTE — Progress Notes (Signed)
 Phone [phone] In person visit   Subjective:   Brianna Simon is a 73 y.o. year old very pleasant female patient who presents for/with See problem oriented charting Chief Complaint  Patient presents with   Medical Management of Chronic Issues   Hypertension   Hyperlipidemia    Past Medical History-  Patient Active Problem List   Diagnosis Date Noted   History of CVA (cerebrovascular accident) 02/22/2010    Priority: High   Fatty liver 05/24/2020    Priority: Medium    Depression, major, single episode, complete remission (HCC) 11/14/2019    Priority: Medium    Hemiplegia affecting dominant side, 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HCC) 03/01/2018    Priority: Medium    Osteoporosis 10/02/2017    Priority: Medium    Former smoker 12/09/2014    Priority: Medium    Anxiety state 10/27/2014    Priority: Medium    Hyperlipidemia 02/22/2010    Priority: Medium    Essential hypertension 12/07/2006    Priority: Medium    Allergic rhinitis 10/27/2014    Priority: Low   Esophageal reflux 12/31/2006    Priority: Low   Insomnia 12/31/2006    Priority: Low   MVP (mitral valve prolapse) 12/10/2006    Priority: Low   Alkaline phosphatase elevation 06/16/2019   Benign paroxysmal positional vertigo 11/13/2016   Low back pain 09/26/2016   Dysarthria 03/02/2015    Medications- reviewed and updated Current Outpatient Medications  Medication Sig Dispense Refill   BIOTIN PO Take by mouth.     buPROPion  (WELLBUTRIN  XL) 150 MG 24 hr tablet Take 1 tablet (150 mg total) by mouth daily. 90 tablet 3   CALCIUM  PO Take by mouth.     cholecalciferol (VITAMIN D ) 1000 units tablet Take 2,000 Units by mouth daily.      clopidogrel  (PLAVIX ) 75 MG tablet TAKE 1 TABLET BY MOUTH EVERY DAY 90 tablet 3   desvenlafaxine  (PRISTIQ ) 100 MG 24 hr tablet TAKE 1 TABLET BY MOUTH EVERY DAY 90 tablet 4   famotidine  (PEPCID ) 10 MG tablet Take 10 mg by mouth as needed for heartburn or indigestion.      hydrochlorothiazide  (HYDRODIURIL ) 25 MG tablet TAKE 1/2 TABLET BY MOUTH DAILY 45 tablet 3   magnesium (MAGTAB) 84 MG ( ) TBCR SR tablet Take 84 mg by mouth.     Melatonin 1 MG TABS Take 5 mg by mouth daily.     Multiple Vitamin (MULTIVITAMIN) tablet Take 1 tablet by mouth daily.     pantoprazole  (PROTONIX ) 20 MG tablet TAKE 1 TABLET BY MOUTH EVERY DAY 90 tablet 3   potassium chloride  (MICRO-K ) 10 MEQ CR capsule Take 1 capsule (10 mEq total) by mouth daily. 90 capsule 3   rosuvastatin  (CRESTOR ) 40 MG tablet TAKE 1 TABLET BY MOUTH EVERY DAY 90 tablet 3   azelastine  (ASTELIN ) 0.1 % nasal spray Place 1 spray into both nostrils daily. 90 mL 3   desloratadine  (CLARINEX ) 5 MG tablet Take 1 tablet (5 mg total) by mouth daily. 90 tablet 3   ipratropium (ATROVENT ) 0.06 % nasal spray Place 2 sprays into both nostrils 3 (three) times daily. 45 mL 3   No current facility-administered medications for this visit.     Objective:  BP 100/64   Pulse 83   Temp (!) 97.5 F (36.4 C)   Ht 5' 2 (1.575 m)   Wt 148 lb (67.1 kg)   LMP  (LMP Unknown)   SpO2 97%   BMI 27.07 kg/m  Gen:  NAD, resting comfortably CV: RRR no murmurs rubs or gallops Lungs: CTAB no crackles, wheeze, rhonchi Ext: no edema Skin: warm, dry    Assessment and Plan   # Right shoulder pain-working with physical therapy from July until August-she reports about 20% improvement but multiple more visits upcoming including next week- wants to continue to work with her and if stagnates then can refer to sports medicine or orthopedics   #hyponatremia- tablets hard to swallow- will trial capsule 10 meq- she prefers to wait until next visit for labs.   # Osteoporosis S: Last DEXA: 01/11/2023 with worst T-score -2.6 and right femoral neck  Medication (bisphosphonate or prolia):  we discussed possible fosamax- she wants to try calcium /vitamin D /weight bearing exercise 150 minutes a week and recheck in 2 years - has kept up walking even with  shoulder issues  Calcium : 1200mg  (through diet ok) recommended  Vitamin D : 1000 units a day recommended- takes 2000 units A/P: hopefully stable- update DEXA in August 2026 or later today. Continue current meds for now    #History of CVA with residual right hemiplegia (numbness mainly right hand with some trouble picking things up as a result- no weakness reported) with loop recorder in place showing no evidence of atrial fibrillation #hyperlipidemia S: Medication: rosuvastatin  40 mg, Plavix  75 mg. No aspirin  Lab Results  Component Value Date   CHOL 85 08/13/2023   HDL 34.50 (L) 08/13/2023   LDLCALC 31 08/13/2023   LDLDIRECT 75 11/14/2019   TRIG 98.0 08/13/2023   CHOLHDL 2 08/13/2023   A/P: strokes history noted - right  hemiplegia noted- continue statin and plavix  For hld- LDL goal under 70- has been at goal- continue current medications    # Depression/Anxiety S: Medication: pristiq  50 mg--> 100mg , Wellbutrin  added on 01/18/2023-up to 300 mg  -prior Lexapro  15 mg     10/16/2023    2:21 PM 06/25/2023    3:16 PM 06/04/2023    4:12 PM  Depression screen PHQ 2/9  Decreased Interest 0 0 0  Down, Depressed, Hopeless 0 0 0  PHQ - 2 Score 0 0 0  Altered sleeping 2 0 1  Tired, decreased energy 2 0 1  Change in appetite 0 0 0  Feeling bad or failure about yourself  0 0 0  Trouble concentrating 1 0 1  Moving slowly or fidgety/restless 0 0 0  Suicidal thoughts 0 0 0  PHQ-9 Score 5 0 3  Difficult doing work/chores Somewhat difficult Not difficult at all Not difficult at all  A/P: imperfect control but tolerable- continue current medications. Low energy still  #hypertension S: medication: hctz 25 mg - takes half tablet daily with potassium 7 days a week BP Readings from Last 3 Encounters:  12/18/23 100/64  10/16/23 120/80  08/13/23 110/68  A/P: well controlled continue current medications   # GERD S:Medication: pantoprazole  20 mg daily as significant flare-ups if she misses doses in  the past.  -also Pepcid  20 mg twice daily over-the-counter trial failed in the past  A/P: reasonable control- continue current medications - as long as avoids triger foods   #Allergies- clarinex  helpful and has bene on long term ipratropium from allergist but Dr. Dasie passed away and we will take over prescription   Recommended follow up: Return in about 6 months (around 06/19/2024) for followup or sooner if needed.Schedule b4 you leave. Future Appointments  Date Time Provider Department Center  12/24/2023  3:15 PM Dow Maxwell, PT [facility] None  12/26/2023  1:00 PM Dow Maxwell, PT [facility] None  01/01/2024 11:00 AM Dow Maxwell, PT [facility] None  01/03/2024  3:15 PM Dow Maxwell, PT [facility] None  01/07/2024  3:15 PM Dow Maxwell, PT [facility] None  01/10/2024  3:15 PM Dow Maxwell, PT [facility] None  06/30/2024  2:20 PM [facility] ANNUAL WELLNESS VISIT 1 [facility]    Lab/Order associations:   ICD-10-CM   1. Essential hypertension  I10     2. Hemiplegia affecting dominant side, 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HCC)  I69.359     3. Hyperlipidemia, unspecified hyperlipidemia type  E78.5     4. Depression, major, single episode, complete remission (HCC)  F32.5       Meds ordered this encounter  Medications   ipratropium (ATROVENT ) 0.06 % nasal spray    Sig: Place 2 sprays into both nostrils 3 (three) times daily.    Dispense:  45 mL    Refill:  3   desloratadine  (CLARINEX ) 5 MG tablet    Sig: Take 1 tablet (5 mg total) by mouth daily.    Dispense:  90 tablet    Refill:  3   potassium chloride  (MICRO-K ) 10 MEQ CR capsule    Sig: Take 1 capsule (10 mEq total) by mouth daily.    Dispense:  90 capsule    Refill:  3   azelastine  (ASTELIN ) 0.1 % nasal spray    Sig: Place 1 spray into both nostrils daily.    Dispense:  90 mL    Refill:  3    Return precautions advised.  Garnette Lukes, MD

## 2023-12-24 ENCOUNTER — Ambulatory Visit (INDEPENDENT_AMBULATORY_CARE_PROVIDER_SITE_OTHER): Admitting: Physical Therapy

## 2023-12-24 ENCOUNTER — Encounter: Payer: Self-pay | Admitting: Physical Therapy

## 2023-12-24 DIAGNOSIS — M25511 Pain in right shoulder: Secondary | ICD-10-CM | POA: Diagnosis not present

## 2023-12-24 NOTE — Therapy (Signed)
 OUTPATIENT PHYSICAL THERAPY UPPER EXTREMITY TREATMENT    Patient Name: Brianna Simon MRN: [id number] DOB:[birth year], 73 y.o., female Today's Date: /12/24/2023    END OF SESSION:  PT End of Session - 12/24/23 1558     Visit Number 4    Number of Visits 16    Date for PT Re-Evaluation 01/15/24    Authorization Type Medicare    PT Start Time 1518    PT Stop Time 1556    PT Time Calculation (min) 38 min    Activity Tolerance Patient tolerated treatment well    Behavior During Therapy WFL for tasks assessed/performed           Past Medical History:  Diagnosis Date   Anxiety    Arthritis    hips, hands, lower back - otc med prn   Cancer (HCC)    skin cancer left wrist and left chest   GERD (gastroesophageal reflux disease)    Hx: UTI (urinary tract infection)    Hyperlipidemia    Hypertension    Mitral valve prolapse    does not cause patient any problems   Seasonal allergies    Stroke (HCC) 2011   mild   SVD (spontaneous vaginal delivery)    x 1 - gave up for adoption   Past Surgical History:  Procedure Laterality Date   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    cataract surgery     july 2024 and aug 2023   COLONOSCOPY     DILATATION & CURETTAGE/HYSTEROSCOPY WITH TRUECLEAR N/A 10/24/2013   Procedure: DILATATION & CURETTAGE/HYSTEROSCOPY WITH TRUCLEAR;  Surgeon: Nathanel LELON Bunker, MD;  Location: [facility];  Service: Gynecology;  Laterality: N/A;  1 1/2hrs OR time   EP IMPLANTABLE DEVICE N/A 03/02/2015   Procedure: Loop Recorder Insertion;  Surgeon: Will Gladis Norton, MD;  Location: [facility];  Service: Cardiovascular;  Laterality: N/A;   lipoma removed     right shoulder   SINUS SURGERY WITH INSTATRAK     WISDOM TOOTH EXTRACTION     Patient Active Problem List   Diagnosis Date Noted   Fatty liver 05/24/2020   Depression, major, single episode, complete remission (HCC) 11/14/2019   Alkaline phosphatase elevation  06/16/2019   Hemiplegia affecting dominant side, 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HCC) 03/01/2018   Osteoporosis 10/02/2017   Benign paroxysmal positional vertigo 11/13/2016   Low back pain 09/26/2016   Dysarthria 03/02/2015   Former smoker 12/09/2014   Allergic rhinitis 10/27/2014   Anxiety state 10/27/2014   Hyperlipidemia 02/22/2010   History of CVA (cerebrovascular accident) 02/22/2010   Esophageal reflux 12/31/2006   Insomnia 12/31/2006   MVP (mitral valve prolapse) 12/10/2006   Essential hypertension 12/07/2006    PCP: Garnette Lukes  REFERRING PROVIDER: Garnette Lukes  REFERRING DIAG: acute pain of R shoulder  THERAPY DIAG:  Acute pain of right shoulder  Rationale for Evaluation and Treatment: Rehabilitation  ONSET DATE:   SUBJECTIVE:  SUBJECTIVE STATEMENT: PT states  improvements in shoulder pain.   Eval: Pt states new onset of pain in R shoulder , has been going to gym working out on equipment, thinks pain may have started with this. She notes Previous pain 12 years ago, which resolved with help from PT. Notes increased pain with Reaching out to side, or back, in back seat.  Can do hair, but reaching up into cabinet is sore.   Hand dominance: Right  PERTINENT HISTORY: CVA (2) - few problems with speech and R hand numb, Skin CA   PAIN:  Are you having pain? Yes: NPRS scale: up to  2/10 Pain location: R shoulder/anterior  Pain description: sore Aggravating factors: reaching out, back.  Relieving factors: rest   PRECAUTIONS: None  RED FLAGS: None   WEIGHT BEARING RESTRICTIONS: No  FALLS:  Has patient fallen in last 6 months? No   PLOF: Independent  PATIENT GOALS: Decreased pain in shoulder.   NEXT MD VISIT:   OBJECTIVE:   DIAGNOSTIC FINDINGS:   PATIENT SURVEYS :     COGNITION: Overall cognitive status: Within functional limits for tasks assessed     SENSATION: WFL  POSTURE:   UPPER EXTREMITY ROM:   Active  ROM Right eval Left eval Right    Shoulder flexion 115 140 125   Shoulder extension      Shoulder abduction 110     Shoulder adduction      Shoulder internal rotation wfl     Shoulder external rotation wfl     Elbow flexion      Elbow extension      Wrist flexion      Wrist extension      Wrist ulnar deviation      Wrist radial deviation      Wrist pronation      Wrist supination      (Blank rows = not tested)  UPPER EXTREMITY MMT:  MMT Right eval Left eval  Shoulder flexion 4-/pain 4+  Shoulder extension    Shoulder abduction 4- 4+  Shoulder adduction    Shoulder internal rotation 4+ 4+  Shoulder external rotation 4 4  Middle trapezius    Lower trapezius    Elbow flexion    Elbow extension    Wrist flexion    Wrist extension    Wrist ulnar deviation    Wrist radial deviation    Wrist pronation    Wrist supination    Grip strength (lbs)    (Blank rows = not tested)  SHOULDER SPECIAL TESTS:   JOINT MOBILITY TESTING:  Mild hypomobility- mostly for flexion   PALPATION:  Tenderness in anterior shoulder, bicep groove, and tenderness into pec.     TODAY'S TREATMENT:  DATE:   12/24/2023 Therapeutic Exercise: Aerobic: Supine:  shoulder flexion/aarom/cane x 10;   pec stretch- arm on towel x 2 min;  prom R shoulder, all motions ; ER at 90 with stick x 10 S/L:  Seated:  pulley x 15 flexion  Standing:  wall slides x 10 on R;     Rows blueTB x 15;   Bicep curls 4 lb x 15 bil  ;  Shoulder ER YTB 2 x 12;  arom abd/full x 10;  arom/abd to 90 deg x 12; flexion arom /full x 12;  Stretches:  Neuromuscular Re-education: Manual Therapy:  Therapeutic Activity: Self Care:   Eval:  ther ex: See below for HEP  PATIENT EDUCATION:  Education details: updated and reviewed HEP Person educated: Patient Education method: Explanation, Demonstration, Tactile cues, Verbal cues, and Handouts Education comprehension: verbalized understanding, returned demonstration, verbal cues required, tactile cues required, and needs further education   HOME EXERCISE PROGRAM: Access Code: [email or web address] URL: [email or web address] Date: 11/22/2023 Prepared by: Tinnie Don  Exercises - Supine Shoulder Flexion Extension AAROM with Dowel  - 1-2 x daily - 1 sets - 10 reps - Supine Pectoralis Stretch  - 1 x daily - 3 reps - 20-30 hold - Standing Scapular Retraction  - 1-2 x daily - 1 sets - 10 reps  ASSESSMENT:  CLINICAL IMPRESSION: 12/24/2023  Pt with much improved pain since last visit. Minimal tenderness in anterior shoulder noted today. Improved ability for PROM and AROM for elevation without pain. Plan to continue mobility and strength as tolerated.    Eval: Patient presents with primary complaint of pain in R shoulder. She has stiffness in GHJ and rom deficits, and increased pain with movement.She has decreased strength as well. She has lack of effective HEP for her ongoing pain. Pt with decreased ability for full functional activities, reaching, lifting, carrying, and IADLs. Pt to benefit from skilled PT to improve deficits and pain.    OBJECTIVE IMPAIRMENTS: decreased activity tolerance, decreased mobility, decreased ROM, decreased strength, hypomobility, increased muscle spasms, impaired UE functional use, and pain.   ACTIVITY LIMITATIONS: carrying, lifting, reach over head, hygiene/grooming, and locomotion level  PARTICIPATION LIMITATIONS: meal prep, cleaning, laundry, driving, shopping, and community activity  PERSONAL FACTORS: none are also affecting patient's functional outcome.   REHAB POTENTIAL: Good  CLINICAL DECISION MAKING: Stable/uncomplicated  EVALUATION  COMPLEXITY: Low  GOALS: Goals reviewed with patient? Yes   SHORT TERM GOALS: Target date: 12/11/23  Pt to be intendment with initial HEP  Goal status: INITIAL  2.  Pt to demo improved shoulder arom by at least 10 deg for elevation.   Goal status: INITIAL    LONG TERM GOALS: Target date: 01/15/24  Pt to be independent with final HEP  Goal status: INITIAL  2.  Pt to demo improved R shoulder  AROM to be [facility] and pain free, to improve ability for ADLs.   Goal status: INITIAL  3.  Pt to demo improved R shoulder strength to be at least 4/5, to improve ability for reach, lift, and IADLS.   Goal status: INITIAL  4.  Pt to report decreased pain in R shoulder to 0-2/10 with activity, adls and iadls.   Goal status: INITIAL    PLAN: PT FREQUENCY: 1-2x/week  PT DURATION: 8 weeks  PLANNED INTERVENTIONS: Therapeutic exercises, Therapeutic activity, Neuromuscular re-education, Patient/Family education, Self Care, Joint mobilization, Joint manipulation, Stair training, DME instructions, Aquatic Therapy, Dry Needling, Electrical stimulation, Cryotherapy, Moist heat, Taping, Ultrasound, Ionotophoresis 4mg /ml Dexamethasone , Manual  therapy,  Vasopneumatic device, Traction, Spinal manipulation, Spinal mobilization,    PLAN FOR NEXT SESSION:   Tinnie Don, PT, DPT 3:59 PM  12/24/23

## 2023-12-26 ENCOUNTER — Ambulatory Visit (INDEPENDENT_AMBULATORY_CARE_PROVIDER_SITE_OTHER): Admitting: Physical Therapy

## 2023-12-26 ENCOUNTER — Encounter: Payer: Self-pay | Admitting: Physical Therapy

## 2023-12-26 DIAGNOSIS — M25511 Pain in right shoulder: Secondary | ICD-10-CM | POA: Diagnosis not present

## 2023-12-26 NOTE — Therapy (Signed)
 OUTPATIENT PHYSICAL THERAPY UPPER EXTREMITY TREATMENT    Patient Name: Brianna Simon MRN: [id number] DOB:07-06-1950, 73 y.o., female Today's Date: /12/26/2023    END OF SESSION:  PT End of Session - 12/26/23 1259     Visit Number 5    Number of Visits 16    Date for PT Re-Evaluation 01/15/24    Authorization Type Medicare    PT Start Time 1300    PT Stop Time 1340    PT Time Calculation (min) 40 min    Activity Tolerance Patient tolerated treatment well    Behavior During Therapy WFL for tasks assessed/performed           Past Medical History:  Diagnosis Date   Anxiety    Arthritis    hips, hands, lower back - otc med prn   Cancer (HCC)    skin cancer left wrist and left chest   GERD (gastroesophageal reflux disease)    Hx: UTI (urinary tract infection)    Hyperlipidemia    Hypertension    Mitral valve prolapse    does not cause patient any problems   Seasonal allergies    Stroke (HCC) 2011   mild   SVD (spontaneous vaginal delivery)    x 1 - gave up for adoption   Past Surgical History:  Procedure Laterality Date   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    cataract surgery     july 2024 and aug 2023   COLONOSCOPY     DILATATION & CURETTAGE/HYSTEROSCOPY WITH TRUECLEAR N/A 10/24/2013   Procedure: DILATATION & CURETTAGE/HYSTEROSCOPY WITH TRUCLEAR;  Surgeon: Nathanel LELON Bunker, MD;  Location: [facility];  Service: Gynecology;  Laterality: N/A;  1 1/2hrs OR time   EP IMPLANTABLE DEVICE N/A 03/02/2015   Procedure: Loop Recorder Insertion;  Surgeon: Will Gladis Norton, MD;  Location: [facility];  Service: Cardiovascular;  Laterality: N/A;   lipoma removed     right shoulder   SINUS SURGERY WITH INSTATRAK     WISDOM TOOTH EXTRACTION     Patient Active Problem List   Diagnosis Date Noted   Fatty liver 05/24/2020   Depression, major, single episode, complete remission (HCC) 11/14/2019   Alkaline phosphatase elevation  06/16/2019   Hemiplegia affecting dominant side, 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HCC) 03/01/2018   Osteoporosis 10/02/2017   Benign paroxysmal positional vertigo 11/13/2016   Low back pain 09/26/2016   Dysarthria 03/02/2015   Former smoker 12/09/2014   Allergic rhinitis 10/27/2014   Anxiety state 10/27/2014   Hyperlipidemia 02/22/2010   History of CVA (cerebrovascular accident) 02/22/2010   Esophageal reflux 12/31/2006   Insomnia 12/31/2006   MVP (mitral valve prolapse) 12/10/2006   Essential hypertension 12/07/2006    PCP: Garnette Lukes  REFERRING PROVIDER: Garnette Lukes  REFERRING DIAG: acute pain of R shoulder  THERAPY DIAG:  Acute pain of right shoulder  Rationale for Evaluation and Treatment: Rehabilitation  ONSET DATE:   SUBJECTIVE:  SUBJECTIVE STATEMENT: PT states  improvements in shoulder pain.   Eval: Pt states new onset of pain in R shoulder , has been going to gym working out on equipment, thinks pain may have started with this. She notes Previous pain 12 years ago, which resolved with help from PT. Notes increased pain with Reaching out to side, or back, in back seat.  Can do hair, but reaching up into cabinet is sore.   Hand dominance: Right  PERTINENT HISTORY: CVA (2) - few problems with speech and R hand numb, Skin CA   PAIN:  Are you having pain? Yes: NPRS scale: up to  2/10 Pain location: R shoulder/anterior  Pain description: sore Aggravating factors: reaching out, back.  Relieving factors: rest   PRECAUTIONS: None  RED FLAGS: None   WEIGHT BEARING RESTRICTIONS: No  FALLS:  Has patient fallen in last 6 months? No   PLOF: Independent  PATIENT GOALS: Decreased pain in shoulder.   NEXT MD VISIT:   OBJECTIVE:   DIAGNOSTIC FINDINGS:   PATIENT SURVEYS :     COGNITION: Overall cognitive status: Within functional limits for tasks assessed     SENSATION: WFL  POSTURE:   UPPER EXTREMITY ROM:   Active  ROM Right eval Left eval Right    Shoulder flexion 115 140 125   Shoulder extension      Shoulder abduction 110     Shoulder adduction      Shoulder internal rotation wfl     Shoulder external rotation wfl     Elbow flexion      Elbow extension      Wrist flexion      Wrist extension      Wrist ulnar deviation      Wrist radial deviation      Wrist pronation      Wrist supination      (Blank rows = not tested)  UPPER EXTREMITY MMT:  MMT Right eval Left eval  Shoulder flexion 4-/pain 4+  Shoulder extension    Shoulder abduction 4- 4+  Shoulder adduction    Shoulder internal rotation 4+ 4+  Shoulder external rotation 4 4  Middle trapezius    Lower trapezius    Elbow flexion    Elbow extension    Wrist flexion    Wrist extension    Wrist ulnar deviation    Wrist radial deviation    Wrist pronation    Wrist supination    Grip strength (lbs)    (Blank rows = not tested)  SHOULDER SPECIAL TESTS:   JOINT MOBILITY TESTING:  Mild hypomobility- mostly for flexion   PALPATION:  Tenderness in anterior shoulder, bicep groove, and tenderness into pec.     TODAY'S TREATMENT:  DATE:   12/26/2023 Therapeutic Exercise: Aerobic: Supine:  shoulder flexion/aarom/cane x 10;   pec stretch- arm on towel x 2 min;  prom R shoulder, all motions ; ER at 90 with stick x 15;    Er butterfly x 12;   horiz abd x 15;  S/L:  Seated:  pulley x 15 flexion  Standing:  wall slides x 15 on R;    Rows blueTB x 15;   Bicep curls 5 lb x 15 bil  ;  Shoulder ER YTB 2 x 12;  arom abd/full x 10;  arom/abd to 90 deg x 12; flexion arom /full x 12;  Wall push ups 2 x 10;  Stretches:  Neuromuscular  Re-education: Manual Therapy:  Therapeutic Activity: Self Care:   Eval: ther ex: See below for HEP  PATIENT EDUCATION:  Education details: updated and reviewed HEP Person educated: Patient Education method: Explanation, Demonstration, Tactile cues, Verbal cues, and Handouts Education comprehension: verbalized understanding, returned demonstration, verbal cues required, tactile cues required, and needs further education   HOME EXERCISE PROGRAM: Access Code: [email or web address] URL: [email or web address] Date: 11/22/2023 Prepared by: Tinnie Don  Exercises - Supine Shoulder Flexion Extension AAROM with Dowel  - 1-2 x daily - 1 sets - 10 reps - Supine Pectoralis Stretch  - 1 x daily - 3 reps - 20-30 hold - Standing Scapular Retraction  - 1-2 x daily - 1 sets - 10 reps  ASSESSMENT:  CLINICAL IMPRESSION: 12/26/2023  Pt making very good progress. Improving mobility without pain, and improved tolerance for strengthening.    Eval: Patient presents with primary complaint of pain in R shoulder. She has stiffness in GHJ and rom deficits, and increased pain with movement.She has decreased strength as well. She has lack of effective HEP for her ongoing pain. Pt with decreased ability for full functional activities, reaching, lifting, carrying, and IADLs. Pt to benefit from skilled PT to improve deficits and pain.    OBJECTIVE IMPAIRMENTS: decreased activity tolerance, decreased mobility, decreased ROM, decreased strength, hypomobility, increased muscle spasms, impaired UE functional use, and pain.   ACTIVITY LIMITATIONS: carrying, lifting, reach over head, hygiene/grooming, and locomotion level  PARTICIPATION LIMITATIONS: meal prep, cleaning, laundry, driving, shopping, and community activity  PERSONAL FACTORS: none are also affecting patient's functional outcome.   REHAB POTENTIAL: Good  CLINICAL DECISION MAKING: Stable/uncomplicated  EVALUATION COMPLEXITY:  Low  GOALS: Goals reviewed with patient? Yes   SHORT TERM GOALS: Target date: 12/11/23  Pt to be intendment with initial HEP  Goal status: INITIAL  2.  Pt to demo improved shoulder arom by at least 10 deg for elevation.   Goal status: INITIAL    LONG TERM GOALS: Target date: 01/15/24  Pt to be independent with final HEP  Goal status: INITIAL  2.  Pt to demo improved R shoulder  AROM to be [facility] and pain free, to improve ability for ADLs.   Goal status: INITIAL  3.  Pt to demo improved R shoulder strength to be at least 4/5, to improve ability for reach, lift, and IADLS.   Goal status: INITIAL  4.  Pt to report decreased pain in R shoulder to 0-2/10 with activity, adls and iadls.   Goal status: INITIAL    PLAN: PT FREQUENCY: 1-2x/week  PT DURATION: 8 weeks  PLANNED INTERVENTIONS: Therapeutic exercises, Therapeutic activity, Neuromuscular re-education, Patient/Family education, Self Care, Joint mobilization, Joint manipulation, Stair training, DME instructions, Aquatic Therapy, Dry Needling, Electrical stimulation, Cryotherapy, Moist heat, Taping, Ultrasound, Ionotophoresis 4mg /ml Dexamethasone ,  Manual therapy,  Vasopneumatic device, Traction, Spinal manipulation, Spinal mobilization,    PLAN FOR NEXT SESSION:   Tinnie Don, PT, DPT 1:00 PM  12/26/23

## 2023-12-27 ENCOUNTER — Encounter: Admitting: Physical Therapy

## 2023-12-31 ENCOUNTER — Encounter: Admitting: Physical Therapy

## 2024-01-01 ENCOUNTER — Ambulatory Visit (INDEPENDENT_AMBULATORY_CARE_PROVIDER_SITE_OTHER): Admitting: Physical Therapy

## 2024-01-01 ENCOUNTER — Encounter: Payer: Self-pay | Admitting: Physical Therapy

## 2024-01-01 DIAGNOSIS — M25511 Pain in right shoulder: Secondary | ICD-10-CM | POA: Diagnosis not present

## 2024-01-01 NOTE — Therapy (Signed)
 OUTPATIENT PHYSICAL THERAPY UPPER EXTREMITY TREATMENT    Patient Name: Brianna Simon MRN: [id number] DOB:Nov 07, 1950, 73 y.o., female Today's Date: /01/01/2024    END OF SESSION:  PT End of Session - 01/01/24 1101     Visit Number 6    Number of Visits 16    Date for PT Re-Evaluation 01/15/24    Authorization Type Medicare    PT Start Time 1102    PT Stop Time 1145    PT Time Calculation (min) 43 min    Activity Tolerance Patient tolerated treatment well    Behavior During Therapy WFL for tasks assessed/performed           Past Medical History:  Diagnosis Date   Anxiety    Arthritis    hips, hands, lower back - otc med prn   Cancer (HCC)    skin cancer left wrist and left chest   GERD (gastroesophageal reflux disease)    Hx: UTI (urinary tract infection)    Hyperlipidemia    Hypertension    Mitral valve prolapse    does not cause patient any problems   Seasonal allergies    Stroke (HCC) 2011   mild   SVD (spontaneous vaginal delivery)    x 1 - gave up for adoption   Past Surgical History:  Procedure Laterality Date   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    cataract surgery     july 2024 and aug 2023   COLONOSCOPY     DILATATION & CURETTAGE/HYSTEROSCOPY WITH TRUECLEAR N/A 10/24/2013   Procedure: DILATATION & CURETTAGE/HYSTEROSCOPY WITH TRUCLEAR;  Surgeon: Nathanel LELON Bunker, MD;  Location: [facility];  Service: Gynecology;  Laterality: N/A;  1 1/2hrs OR time   EP IMPLANTABLE DEVICE N/A 03/02/2015   Procedure: Loop Recorder Insertion;  Surgeon: Will Gladis Norton, MD;  Location: [facility];  Service: Cardiovascular;  Laterality: N/A;   lipoma removed     right shoulder   SINUS SURGERY WITH INSTATRAK     WISDOM TOOTH EXTRACTION     Patient Active Problem List   Diagnosis Date Noted   Fatty liver 05/24/2020   Depression, major, single episode, complete remission (HCC) 11/14/2019   Alkaline phosphatase elevation  06/16/2019   Hemiplegia affecting dominant side, 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HCC) 03/01/2018   Osteoporosis 10/02/2017   Benign paroxysmal positional vertigo 11/13/2016   Low back pain 09/26/2016   Dysarthria 03/02/2015   Former smoker 12/09/2014   Allergic rhinitis 10/27/2014   Anxiety state 10/27/2014   Hyperlipidemia 02/22/2010   History of CVA (cerebrovascular accident) 02/22/2010   Esophageal reflux 12/31/2006   Insomnia 12/31/2006   MVP (mitral valve prolapse) 12/10/2006   Essential hypertension 12/07/2006    PCP: Garnette Lukes  REFERRING PROVIDER: Garnette Lukes  REFERRING DIAG: acute pain of R shoulder  THERAPY DIAG:  Acute pain of right shoulder  Rationale for Evaluation and Treatment: Rehabilitation  ONSET DATE:   SUBJECTIVE:  SUBJECTIVE STATEMENT: PT states  improvements in shoulder pain. Feels like she is  85-90 % improved.   Eval: Pt states new onset of pain in R shoulder , has been going to gym working out on equipment, thinks pain may have started with this. She notes Previous pain 12 years ago, which resolved with help from PT. Notes increased pain with Reaching out to side, or back, in back seat.  Can do hair, but reaching up into cabinet is sore.   Hand dominance: Right  PERTINENT HISTORY: CVA (2) - few problems with speech and R hand numb, Skin CA   PAIN:  Are you having pain? Yes: NPRS scale: up to  2/10 Pain location: R shoulder/anterior  Pain description: sore Aggravating factors: reaching out, back.  Relieving factors: rest   PRECAUTIONS: None  RED FLAGS: None   WEIGHT BEARING RESTRICTIONS: No  FALLS:  Has patient fallen in last 6 months? No   PLOF: Independent  PATIENT GOALS: Decreased pain in shoulder.   NEXT MD VISIT:   OBJECTIVE:   DIAGNOSTIC FINDINGS:    PATIENT SURVEYS :    COGNITION: Overall cognitive status: Within functional limits for tasks assessed     SENSATION: WFL  POSTURE:   UPPER EXTREMITY ROM:   Active  ROM Right eval Left eval Right    Shoulder flexion 115 140 125   Shoulder extension      Shoulder abduction 110     Shoulder adduction      Shoulder internal rotation wfl     Shoulder external rotation wfl     Elbow flexion      Elbow extension      Wrist flexion      Wrist extension      Wrist ulnar deviation      Wrist radial deviation      Wrist pronation      Wrist supination      (Blank rows = not tested)  UPPER EXTREMITY MMT:  MMT Right eval Left eval  Shoulder flexion 4-/pain 4+  Shoulder extension    Shoulder abduction 4- 4+  Shoulder adduction    Shoulder internal rotation 4+ 4+  Shoulder external rotation 4 4  Middle trapezius    Lower trapezius    Elbow flexion    Elbow extension    Wrist flexion    Wrist extension    Wrist ulnar deviation    Wrist radial deviation    Wrist pronation    Wrist supination    Grip strength (lbs)    (Blank rows = not tested)  SHOULDER SPECIAL TESTS:   JOINT MOBILITY TESTING:  Mild hypomobility- mostly for flexion   PALPATION:  Tenderness in anterior shoulder, bicep groove, and tenderness into pec.     TODAY'S TREATMENT:  DATE:   01/01/2024 Therapeutic Exercise: Aerobic: Supine:  shoulder flexion/aarom/cane x 15;   pec stretch- arm on towel x 2 min;   prom R shoulder, all motions ;  ER at 90 with stick x 15;   ER butterfly x 12;    S/L:  Seated:  pulley x 15 flexion  Standing:  wall slides x 15 on R;   Rows blueTB x 15;   Bicep curls 5 lb x 15 bil  ; Shoulder ER YTB 2 x 12;  arom abd/full x 10;  arom/abd to 90 deg x 12;  flexion arom /full x 12;  Wall push ups 2 x 10;  Stretches:  Neuromuscular  Re-education: Manual Therapy:  Therapeutic Activity: Self Care:   Eval: ther ex: See below for HEP  PATIENT EDUCATION:  Education details: updated and reviewed HEP Person educated: Patient Education method: Explanation, Demonstration, Tactile cues, Verbal cues, and Handouts Education comprehension: verbalized understanding, returned demonstration, verbal cues required, tactile cues required, and needs further education   HOME EXERCISE PROGRAM: Access Code: [email or web address] URL: [email or web address] Date: 11/22/2023 Prepared by: Tinnie Don  Exercises - Supine Shoulder Flexion Extension AAROM with Dowel  - 1-2 x daily - 1 sets - 10 reps - Supine Pectoralis Stretch  - 1 x daily - 3 reps - 20-30 hold - Standing Scapular Retraction  - 1-2 x daily - 1 sets - 10 reps  ASSESSMENT:  CLINICAL IMPRESSION: 01/01/2024  Pt making very good progress. Improving mobility without pain, and improved tolerance for strengthening. She will benefit from continued care to progress strength and mobility. Plan to review gym equipment for safe return to gym next visit.    Eval: Patient presents with primary complaint of pain in R shoulder. She has stiffness in GHJ and rom deficits, and increased pain with movement.She has decreased strength as well. She has lack of effective HEP for her ongoing pain. Pt with decreased ability for full functional activities, reaching, lifting, carrying, and IADLs. Pt to benefit from skilled PT to improve deficits and pain.    OBJECTIVE IMPAIRMENTS: decreased activity tolerance, decreased mobility, decreased ROM, decreased strength, hypomobility, increased muscle spasms, impaired UE functional use, and pain.   ACTIVITY LIMITATIONS: carrying, lifting, reach over head, hygiene/grooming, and locomotion level  PARTICIPATION LIMITATIONS: meal prep, cleaning, laundry, driving, shopping, and community activity  PERSONAL FACTORS: none are also affecting patient's  functional outcome.   REHAB POTENTIAL: Good  CLINICAL DECISION MAKING: Stable/uncomplicated  EVALUATION COMPLEXITY: Low  GOALS: Goals reviewed with patient? Yes   SHORT TERM GOALS: Target date: 12/11/23  Pt to be intendment with initial HEP  Goal status: INITIAL  2.  Pt to demo improved shoulder arom by at least 10 deg for elevation.   Goal status: INITIAL    LONG TERM GOALS: Target date: 01/15/24  Pt to be independent with final HEP  Goal status: INITIAL  2.  Pt to demo improved R shoulder  AROM to be [facility] and pain free, to improve ability for ADLs.   Goal status: INITIAL  3.  Pt to demo improved R shoulder strength to be at least 4/5, to improve ability for reach, lift, and IADLS.   Goal status: INITIAL  4.  Pt to report decreased pain in R shoulder to 0-2/10 with activity, adls and iadls.   Goal status: INITIAL    PLAN: PT FREQUENCY: 1-2x/week  PT DURATION: 8 weeks  PLANNED INTERVENTIONS: Therapeutic exercises, Therapeutic activity, Neuromuscular re-education, Patient/Family education, Self Care, Joint mobilization, Joint  manipulation, Stair training, DME instructions, Aquatic Therapy, Dry Needling, Electrical stimulation, Cryotherapy, Moist heat, Taping, Ultrasound, Ionotophoresis 4mg /ml Dexamethasone , Manual therapy,  Vasopneumatic device, Traction, Spinal manipulation, Spinal mobilization,    PLAN FOR NEXT SESSION:   gym equipment.   Tinnie Don, PT, DPT 11:02 AM  01/01/24

## 2024-01-03 ENCOUNTER — Ambulatory Visit (INDEPENDENT_AMBULATORY_CARE_PROVIDER_SITE_OTHER): Admitting: Physical Therapy

## 2024-01-03 ENCOUNTER — Encounter: Payer: Self-pay | Admitting: Physical Therapy

## 2024-01-03 DIAGNOSIS — M25511 Pain in right shoulder: Secondary | ICD-10-CM | POA: Diagnosis not present

## 2024-01-03 NOTE — Therapy (Unsigned)
 OUTPATIENT PHYSICAL THERAPY UPPER EXTREMITY TREATMENT    Patient Name: Brianna Simon MRN: [id number] DOB:20-Nov-1950, 73 y.o., female Today's Date: 01/03/2024    END OF SESSION:  PT End of Session - 01/03/24 1556     Visit Number 7    Number of Visits 16    Date for PT Re-Evaluation 01/15/24    Authorization Type Medicare    PT Start Time 1520    PT Stop Time 1558    PT Time Calculation (min) 38 min    Activity Tolerance Patient tolerated treatment well    Behavior During Therapy WFL for tasks assessed/performed            Past Medical History:  Diagnosis Date   Anxiety    Arthritis    hips, hands, lower back - otc med prn   Cancer (HCC)    skin cancer left wrist and left chest   GERD (gastroesophageal reflux disease)    Hx: UTI (urinary tract infection)    Hyperlipidemia    Hypertension    Mitral valve prolapse    does not cause patient any problems   Seasonal allergies    Stroke (HCC) 2011   mild   SVD (spontaneous vaginal delivery)    x 1 - gave up for adoption   Past Surgical History:  Procedure Laterality Date   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    cataract surgery     july 2024 and aug 2023   COLONOSCOPY     DILATATION & CURETTAGE/HYSTEROSCOPY WITH TRUECLEAR N/A 10/24/2013   Procedure: DILATATION & CURETTAGE/HYSTEROSCOPY WITH TRUCLEAR;  Surgeon: Nathanel LELON Bunker, MD;  Location: [facility];  Service: Gynecology;  Laterality: N/A;  1 1/2hrs OR time   EP IMPLANTABLE DEVICE N/A 03/02/2015   Procedure: Loop Recorder Insertion;  Surgeon: Will Gladis Norton, MD;  Location: [facility];  Service: Cardiovascular;  Laterality: N/A;   lipoma removed     right shoulder   SINUS SURGERY WITH INSTATRAK     WISDOM TOOTH EXTRACTION     Patient Active Problem List   Diagnosis Date Noted   Fatty liver 05/24/2020   Depression, major, single episode, complete remission (HCC) 11/14/2019   Alkaline phosphatase elevation  06/16/2019   Hemiplegia affecting dominant side, 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HCC) 03/01/2018   Osteoporosis 10/02/2017   Benign paroxysmal positional vertigo 11/13/2016   Low back pain 09/26/2016   Dysarthria 03/02/2015   Former smoker 12/09/2014   Allergic rhinitis 10/27/2014   Anxiety state 10/27/2014   Hyperlipidemia 02/22/2010   History of CVA (cerebrovascular accident) 02/22/2010   Esophageal reflux 12/31/2006   Insomnia 12/31/2006   MVP (mitral valve prolapse) 12/10/2006   Essential hypertension 12/07/2006    PCP: Garnette Lukes  REFERRING PROVIDER: Garnette Lukes  REFERRING DIAG: acute pain of R shoulder  THERAPY DIAG:  Acute pain of right shoulder  Rationale for Evaluation and Treatment: Rehabilitation  ONSET DATE:   SUBJECTIVE:  SUBJECTIVE STATEMENT: Pt states improvements in shoulder pain. Did have some muscle soreness after last visit.   Eval: Pt states new onset of pain in R shoulder , has been going to gym working out on equipment, thinks pain may have started with this. She notes Previous pain 12 years ago, which resolved with help from PT. Notes increased pain with Reaching out to side, or back, in back seat.  Can do hair, but reaching up into cabinet is sore.   Hand dominance: Right  PERTINENT HISTORY: CVA (2) - few problems with speech and R hand numb, Skin CA   PAIN:  Are you having pain? Yes: NPRS scale: up to  2/10 Pain location: R shoulder/anterior  Pain description: sore Aggravating factors: reaching out, back.  Relieving factors: rest   PRECAUTIONS: None  RED FLAGS: None   WEIGHT BEARING RESTRICTIONS: No  FALLS:  Has patient fallen in last 6 months? No   PLOF: Independent  PATIENT GOALS: Decreased pain in shoulder.   NEXT MD VISIT:   OBJECTIVE:   DIAGNOSTIC  FINDINGS:   PATIENT SURVEYS :    COGNITION: Overall cognitive status: Within functional limits for tasks assessed     SENSATION: WFL  POSTURE:   UPPER EXTREMITY ROM:   Active  ROM Right eval Left eval Right  Right 01/03/24 Left 01/03/24  Shoulder flexion 115 140 125 155 155  Shoulder extension       Shoulder abduction 110      Shoulder adduction       Shoulder internal rotation wfl      Shoulder external rotation wfl      Elbow flexion       Elbow extension       Wrist flexion       Wrist extension       Wrist ulnar deviation       Wrist radial deviation       Wrist pronation       Wrist supination       (Blank rows = not tested)  UPPER EXTREMITY MMT:  MMT Right eval Left eval  Shoulder flexion 4-/pain 4+  Shoulder extension    Shoulder abduction 4- 4+  Shoulder adduction    Shoulder internal rotation 4+ 4+  Shoulder external rotation 4 4  Middle trapezius    Lower trapezius    Elbow flexion    Elbow extension    Wrist flexion    Wrist extension    Wrist ulnar deviation    Wrist radial deviation    Wrist pronation    Wrist supination    Grip strength (lbs)    (Blank rows = not tested)  SHOULDER SPECIAL TESTS:   JOINT MOBILITY TESTING:  Mild hypomobility- mostly for flexion   PALPATION:  Tenderness in anterior shoulder, bicep groove, and tenderness into pec.     TODAY'S TREATMENT:  DATE:   01/03/2024 Therapeutic Exercise: reviewed ther ex for HEP and safe gym equipment  Aerobic: Supine:  shoulder flexion/aarom/cane x 15;   pec stretch- arm on towel x 2 min;   prom R shoulder, all motions ;  ER at 90 with stick x 15;   ER butterfly x 12;    S/L:  Seated:  pulley x 15 flexion ;  overhead press 4 lb x 8;   Standing:  wall slides x 15 on R;   Rows blueTB x 15;   Bicep curls 5 lb x 15 bil  ;Shoulder ER YTB 2 x  12;   arom abd/full x 10;  arom/abd to 90 deg x 12;  flexion arom /full x 12;  Chest press Blue TB x 15;   Wall push ups 2 x 10;  Stretches:  Neuromuscular Re-education: Manual Therapy:  Therapeutic Activity: Self Care:   Eval: ther ex: See below for HEP  PATIENT EDUCATION:  Education details: updated and reviewed HEP Person educated: Patient Education method: Explanation, Demonstration, Tactile cues, Verbal cues, and Handouts Education comprehension: verbalized understanding, returned demonstration, verbal cues required, tactile cues required, and needs further education   HOME EXERCISE PROGRAM: Access Code: [email or web address] URL: [email or web address] Date: 11/22/2023 Prepared by: Tinnie Don  Exercises - Supine Shoulder Flexion Extension AAROM with Dowel  - 1-2 x daily - 1 sets - 10 reps - Supine Pectoralis Stretch  - 1 x daily - 3 reps - 20-30 hold - Standing Scapular Retraction  - 1-2 x daily - 1 sets - 10 reps  ASSESSMENT:  CLINICAL IMPRESSION: 01/03/2024  Pt making very good progress. Improving mobility without pain, and improved tolerance for strengthening. She will benefit from continued care to progress strength and mobility. Likely d/c next visit. Reviewed safe gym equipment to use at this time, in detail today.    Eval: Patient presents with primary complaint of pain in R shoulder. She has stiffness in GHJ and rom deficits, and increased pain with movement.She has decreased strength as well. She has lack of effective HEP for her ongoing pain. Pt with decreased ability for full functional activities, reaching, lifting, carrying, and IADLs. Pt to benefit from skilled PT to improve deficits and pain.    OBJECTIVE IMPAIRMENTS: decreased activity tolerance, decreased mobility, decreased ROM, decreased strength, hypomobility, increased muscle spasms, impaired UE functional use, and pain.   ACTIVITY LIMITATIONS: carrying, lifting, reach over head,  hygiene/grooming, and locomotion level  PARTICIPATION LIMITATIONS: meal prep, cleaning, laundry, driving, shopping, and community activity  PERSONAL FACTORS: none are also affecting patient's functional outcome.   REHAB POTENTIAL: Good  CLINICAL DECISION MAKING: Stable/uncomplicated  EVALUATION COMPLEXITY: Low  GOALS: Goals reviewed with patient? Yes   SHORT TERM GOALS: Target date: 12/11/23  Pt to be intendment with initial HEP  Goal status: MET  2.  Pt to demo improved shoulder arom by at least 10 deg for elevation.   Goal status: MET    LONG TERM GOALS: Target date: 01/15/24  Pt to be independent with final HEP  Goal status: In progress  2.  Pt to demo improved R shoulder  AROM to be [facility] and pain free, to improve ability for ADLs.   Goal status: MET  3.  Pt to demo improved R shoulder strength to be at least 4/5, to improve ability for reach, lift, and IADLS.   Goal status: In progress  4.  Pt to report decreased pain in R shoulder to 0-2/10 with activity, adls and iadls.  Goal status: MET    PLAN: PT FREQUENCY: 1-2x/week  PT DURATION: 8 weeks  PLANNED INTERVENTIONS: Therapeutic exercises, Therapeutic activity, Neuromuscular re-education, Patient/Family education, Self Care, Joint mobilization, Joint manipulation, Stair training, DME instructions, Aquatic Therapy, Dry Needling, Electrical stimulation, Cryotherapy, Moist heat, Taping, Ultrasound, Ionotophoresis 4mg /ml Dexamethasone , Manual therapy,  Vasopneumatic device, Traction, Spinal manipulation, Spinal mobilization,    PLAN FOR NEXT SESSION:     Tinnie Don, PT, DPT 3:58 PM  01/03/24

## 2024-01-07 ENCOUNTER — Encounter: Admitting: Physical Therapy

## 2024-01-10 ENCOUNTER — Encounter: Payer: Self-pay | Admitting: Physical Therapy

## 2024-01-10 ENCOUNTER — Ambulatory Visit (INDEPENDENT_AMBULATORY_CARE_PROVIDER_SITE_OTHER): Admitting: Physical Therapy

## 2024-01-10 DIAGNOSIS — M25511 Pain in right shoulder: Secondary | ICD-10-CM | POA: Diagnosis not present

## 2024-01-10 NOTE — Therapy (Unsigned)
 OUTPATIENT PHYSICAL THERAPY UPPER EXTREMITY TREATMENT    Patient Name: Brianna Simon MRN: [id number] DOB:02/15/1951, 73 y.o., female Today's Date: 01/10/2024    END OF SESSION:  PT End of Session - 01/10/24 1516     Visit Number 8    Number of Visits 16    Date for PT Re-Evaluation 01/15/24    Authorization Type Medicare    PT Start Time 1517    PT Stop Time 1558    PT Time Calculation (min) 41 min    Activity Tolerance Patient tolerated treatment well    Behavior During Therapy WFL for tasks assessed/performed            Past Medical History:  Diagnosis Date   Anxiety    Arthritis    hips, hands, lower back - otc med prn   Cancer (HCC)    skin cancer left wrist and left chest   GERD (gastroesophageal reflux disease)    Hx: UTI (urinary tract infection)    Hyperlipidemia    Hypertension    Mitral valve prolapse    does not cause patient any problems   Seasonal allergies    Stroke (HCC) 2011   mild   SVD (spontaneous vaginal delivery)    x 1 - gave up for adoption   Past Surgical History:  Procedure Laterality Date   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    cataract surgery     july 2024 and aug 2023   COLONOSCOPY     DILATATION & CURETTAGE/HYSTEROSCOPY WITH TRUECLEAR N/A 10/24/2013   Procedure: DILATATION & CURETTAGE/HYSTEROSCOPY WITH TRUCLEAR;  Surgeon: Nathanel LELON Bunker, MD;  Location: [facility];  Service: Gynecology;  Laterality: N/A;  1 1/2hrs OR time   EP IMPLANTABLE DEVICE N/A 03/02/2015   Procedure: Loop Recorder Insertion;  Surgeon: Will Gladis Norton, MD;  Location: [facility];  Service: Cardiovascular;  Laterality: N/A;   lipoma removed     right shoulder   SINUS SURGERY WITH INSTATRAK     WISDOM TOOTH EXTRACTION     Patient Active Problem List   Diagnosis Date Noted   Fatty liver 05/24/2020   Depression, major, single episode, complete remission (HCC) 11/14/2019   Alkaline phosphatase elevation  06/16/2019   Hemiplegia affecting dominant side, 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HCC) 03/01/2018   Osteoporosis 10/02/2017   Benign paroxysmal positional vertigo 11/13/2016   Low back pain 09/26/2016   Dysarthria 03/02/2015   Former smoker 12/09/2014   Allergic rhinitis 10/27/2014   Anxiety state 10/27/2014   Hyperlipidemia 02/22/2010   History of CVA (cerebrovascular accident) 02/22/2010   Esophageal reflux 12/31/2006   Insomnia 12/31/2006   MVP (mitral valve prolapse) 12/10/2006   Essential hypertension 12/07/2006    PCP: Garnette Lukes  REFERRING PROVIDER: Garnette Lukes  REFERRING DIAG: acute pain of R shoulder  THERAPY DIAG:  Acute pain of right shoulder  Rationale for Evaluation and Treatment: Rehabilitation  ONSET DATE:   SUBJECTIVE:  SUBJECTIVE STATEMENT: Pt states shoulder doing very well.   Eval: Pt states new onset of pain in R shoulder , has been going to gym working out on equipment, thinks pain may have started with this. She notes Previous pain 12 years ago, which resolved with help from PT. Notes increased pain with Reaching out to side, or back, in back seat.  Can do hair, but reaching up into cabinet is sore.   Hand dominance: Right  PERTINENT HISTORY: CVA (2) - few problems with speech and R hand numb, Skin CA   PAIN:  Are you having pain? Yes: NPRS scale: up to  2/10 Pain location: R shoulder/anterior  Pain description: sore Aggravating factors: reaching out, back.  Relieving factors: rest   PRECAUTIONS: None  RED FLAGS: None   WEIGHT BEARING RESTRICTIONS: No  FALLS:  Has patient fallen in last 6 months? No   PLOF: Independent  PATIENT GOALS: Decreased pain in shoulder.   NEXT MD VISIT:   OBJECTIVE:   DIAGNOSTIC FINDINGS:   PATIENT SURVEYS :    COGNITION: Overall  cognitive status: Within functional limits for tasks assessed     SENSATION: WFL  POSTURE:   UPPER EXTREMITY ROM:   Active  ROM Right eval Left eval Right  Right 01/03/24 Left 01/03/24  Shoulder flexion 115 140 125 155 155  Shoulder extension       Shoulder abduction 110      Shoulder adduction       Shoulder internal rotation wfl      Shoulder external rotation wfl      Elbow flexion       Elbow extension       Wrist flexion       Wrist extension       Wrist ulnar deviation       Wrist radial deviation       Wrist pronation       Wrist supination       (Blank rows = not tested)  UPPER EXTREMITY MMT:  MMT Right eval Left eval  Shoulder flexion 4-/pain 4+  Shoulder extension    Shoulder abduction 4- 4+  Shoulder adduction    Shoulder internal rotation 4+ 4+  Shoulder external rotation 4 4  Middle trapezius    Lower trapezius    Elbow flexion    Elbow extension    Wrist flexion    Wrist extension    Wrist ulnar deviation    Wrist radial deviation    Wrist pronation    Wrist supination    Grip strength (lbs)    (Blank rows = not tested)  SHOULDER SPECIAL TESTS:   JOINT MOBILITY TESTING:  Mild hypomobility- mostly for flexion   PALPATION:  Tenderness in anterior shoulder, bicep groove, and tenderness into pec.     TODAY'S TREATMENT:  DATE:   01/10/2024 Therapeutic Exercise: reviewed ther ex for HEP Aerobic: Supine:  shoulder flexion/aarom/cane x 15;   pec stretch- 30 sec x 3;  prom R shoulder, all motions ;  S/L:  Seated:  pulley x 15 flexion ;  overhead press 4 lb x 8;   Standing:    Rows blueTB x 15;   Bicep curls 5 lb x 15 bil  ;Shoulder ER YTB 2 x 12;   horiz abd RTB 2 x 10;  arom abd/full x 10;    flexion arom /full x 10;  Wall push ups 2 x 10;  Stretches:  Neuromuscular Re-education: Manual Therapy:   Therapeutic Activity: Self Care:   Eval: ther ex: See below for HEP  PATIENT EDUCATION:  Education details: updated and reviewed HEP Person educated: Patient Education method: Explanation, Demonstration, Tactile cues, Verbal cues, and Handouts Education comprehension: verbalized understanding, returned demonstration, verbal cues required, tactile cues required, and needs further education   HOME EXERCISE PROGRAM: Access Code: [email or web address]    ASSESSMENT:  CLINICAL IMPRESSION: 01/10/2024  Pt making very good progress. Improving mobility without pain, and improved tolerance for strengthening. She will benefit from continued care to progress strength and mobility. Likely d/c next visit. Reviewed safe gym equipment to use at this time, in detail today.    Eval: Patient presents with primary complaint of pain in R shoulder. She has stiffness in GHJ and rom deficits, and increased pain with movement.She has decreased strength as well. She has lack of effective HEP for her ongoing pain. Pt with decreased ability for full functional activities, reaching, lifting, carrying, and IADLs. Pt to benefit from skilled PT to improve deficits and pain.    OBJECTIVE IMPAIRMENTS: decreased activity tolerance, decreased mobility, decreased ROM, decreased strength, hypomobility, increased muscle spasms, impaired UE functional use, and pain.   ACTIVITY LIMITATIONS: carrying, lifting, reach over head, hygiene/grooming, and locomotion level  PARTICIPATION LIMITATIONS: meal prep, cleaning, laundry, driving, shopping, and community activity  PERSONAL FACTORS: none are also affecting patient's functional outcome.   REHAB POTENTIAL: Good  CLINICAL DECISION MAKING: Stable/uncomplicated  EVALUATION COMPLEXITY: Low  GOALS: Goals reviewed with patient? Yes   SHORT TERM GOALS: Target date: 12/11/23  Pt to be intendment with initial HEP  Goal status: MET  2.  Pt to demo improved shoulder arom by at least  10 deg for elevation.   Goal status: MET    LONG TERM GOALS: Target date: 01/15/24  Pt to be independent with final HEP  Goal status: MET  2.  Pt to demo improved R shoulder  AROM to be [facility] and pain free, to improve ability for ADLs.   Goal status: MET  3.  Pt to demo improved R shoulder strength to be at least 4/5, to improve ability for reach, lift, and IADLS.   Goal status: MET  4.  Pt to report decreased pain in R shoulder to 0-2/10 with activity, adls and iadls.   Goal status: MET    PLAN: PT FREQUENCY: 1-2x/week  PT DURATION: 8 weeks  PLANNED INTERVENTIONS: Therapeutic exercises, Therapeutic activity, Neuromuscular re-education, Patient/Family education, Self Care, Joint mobilization, Joint manipulation, Stair training, DME instructions, Aquatic Therapy, Dry Needling, Electrical stimulation, Cryotherapy, Moist heat, Taping, Ultrasound, Ionotophoresis 4mg /ml Dexamethasone , Manual therapy,  Vasopneumatic device, Traction, Spinal manipulation, Spinal mobilization,    PLAN FOR NEXT SESSION:     Tinnie Don, PT, DPT 3:16 PM  01/10/24     PHYSICAL THERAPY DISCHARGE SUMMARY  Visits from Start of Care:  8  Plan: Patient agrees to discharge.  Patient goals were met. Patient is being discharged due to meeting the stated rehab goals.     Tinnie Don, PT, DPT 3:20 PM  01/10/24

## 2024-02-11 DIAGNOSIS — H26493 Other secondary cataract, bilateral: Secondary | ICD-10-CM | POA: Diagnosis not present

## 2024-02-13 DIAGNOSIS — Z23 Encounter for immunization: Secondary | ICD-10-CM | POA: Diagnosis not present

## 2024-02-20 ENCOUNTER — Telehealth: Payer: Self-pay | Admitting: Family Medicine

## 2024-02-20 NOTE — Telephone Encounter (Signed)
 I have added thi vaccine to her chart.    Copied from CRM [id number]. Topic: General - Other >> Feb 19, 2024  5:18 PM Brianna Simon wrote: Reason for CRM: Patient received her COVID vaccination October 1st and would like for the nurse to add it in her immunization records.

## 2024-03-01 ENCOUNTER — Other Ambulatory Visit: Payer: Self-pay | Admitting: Family Medicine

## 2024-03-17 ENCOUNTER — Encounter: Payer: Self-pay | Admitting: Radiology

## 2024-04-01 DIAGNOSIS — Z23 Encounter for immunization: Secondary | ICD-10-CM | POA: Diagnosis not present

## 2024-05-26 ENCOUNTER — Other Ambulatory Visit: Payer: Self-pay | Admitting: Obstetrics and Gynecology

## 2024-05-26 DIAGNOSIS — Z1231 Encounter for screening mammogram for malignant neoplasm of breast: Secondary | ICD-10-CM

## 2024-06-09 ENCOUNTER — Ambulatory Visit

## 2024-06-18 ENCOUNTER — Ambulatory Visit

## 2024-06-19 ENCOUNTER — Ambulatory Visit: Admitting: Family Medicine

## 2024-06-27 ENCOUNTER — Ambulatory Visit

## 2024-06-30 ENCOUNTER — Ambulatory Visit: Payer: Medicare Other

## 2024-07-10 ENCOUNTER — Ambulatory Visit: Admitting: Family Medicine

## 2024-07-14 ENCOUNTER — Ambulatory Visit: Admitting: Allergy and Immunology

## 2024-08-18 ENCOUNTER — Ambulatory Visit: Admitting: Allergy and Immunology
